# Patient Record
Sex: Male | Born: 1945
Health system: Southern US, Community
[De-identification: ages and names within clinical notes are randomized; demographics above are authoritative.]

## PROBLEM LIST (undated history)

## (undated) DIAGNOSIS — E785 Hyperlipidemia, unspecified: Secondary | ICD-10-CM

## (undated) DIAGNOSIS — Z85850 Personal history of malignant neoplasm of thyroid: Secondary | ICD-10-CM

## (undated) DIAGNOSIS — R600 Localized edema: Secondary | ICD-10-CM

## (undated) DIAGNOSIS — C73 Malignant neoplasm of thyroid gland: Secondary | ICD-10-CM

## (undated) DIAGNOSIS — M1990 Unspecified osteoarthritis, unspecified site: Secondary | ICD-10-CM

## (undated) DIAGNOSIS — K222 Esophageal obstruction: Secondary | ICD-10-CM

## (undated) DIAGNOSIS — C801 Malignant (primary) neoplasm, unspecified: Secondary | ICD-10-CM

## (undated) DIAGNOSIS — K219 Gastro-esophageal reflux disease without esophagitis: Secondary | ICD-10-CM

## (undated) DIAGNOSIS — I341 Nonrheumatic mitral (valve) prolapse: Secondary | ICD-10-CM

## (undated) DIAGNOSIS — L409 Psoriasis, unspecified: Secondary | ICD-10-CM

## (undated) DIAGNOSIS — K449 Diaphragmatic hernia without obstruction or gangrene: Secondary | ICD-10-CM

## (undated) DIAGNOSIS — R351 Nocturia: Secondary | ICD-10-CM

## (undated) DIAGNOSIS — L4050 Arthropathic psoriasis, unspecified: Secondary | ICD-10-CM

## (undated) DIAGNOSIS — D2921 Benign neoplasm of right testis: Secondary | ICD-10-CM

## (undated) DIAGNOSIS — M419 Scoliosis, unspecified: Secondary | ICD-10-CM

## (undated) DIAGNOSIS — I7300 Raynaud's syndrome without gangrene: Secondary | ICD-10-CM

## (undated) DIAGNOSIS — K922 Gastrointestinal hemorrhage, unspecified: Secondary | ICD-10-CM

## (undated) DIAGNOSIS — I10 Essential (primary) hypertension: Secondary | ICD-10-CM

## (undated) DIAGNOSIS — E782 Mixed hyperlipidemia: Secondary | ICD-10-CM

## (undated) DIAGNOSIS — I872 Venous insufficiency (chronic) (peripheral): Secondary | ICD-10-CM

## (undated) DIAGNOSIS — E039 Hypothyroidism, unspecified: Secondary | ICD-10-CM

## (undated) HISTORY — PX: HERNIA REPAIR: SHX51

## (undated) HISTORY — DX: Malignant neoplasm of thyroid gland: C73

## (undated) HISTORY — PX: THYROIDECTOMY, PARTIAL: SHX18

## (undated) HISTORY — PX: COLONOSCOPY: SHX174

## (undated) HISTORY — PX: KNEE ARTHROSCOPY: SHX127

## (undated) HISTORY — PX: VARICOSE VEIN SURGERY: SHX832

## (undated) HISTORY — DX: Arthropathic psoriasis, unspecified: L40.50

## (undated) HISTORY — DX: Esophageal obstruction: K22.2

## (undated) HISTORY — DX: Gastrointestinal hemorrhage, unspecified: K92.2

## (undated) HISTORY — PX: TONSILLECTOMY: SUR1361

## (undated) HISTORY — DX: Raynaud's syndrome without gangrene: I73.00

## (undated) HISTORY — DX: Personal history of malignant neoplasm of thyroid: Z85.850

## (undated) HISTORY — PX: UMBILICAL HERNIA REPAIR: SHX196

---

## 1971-12-11 DIAGNOSIS — Z85850 Personal history of malignant neoplasm of thyroid: Secondary | ICD-10-CM

## 1971-12-11 DIAGNOSIS — E890 Postprocedural hypothyroidism: Secondary | ICD-10-CM

## 1971-12-11 HISTORY — PX: TOTAL THYROIDECTOMY: SHX2547

## 1971-12-11 HISTORY — DX: Personal history of malignant neoplasm of thyroid: Z85.850

## 1971-12-11 HISTORY — DX: Postprocedural hypothyroidism: E89.0

## 1981-12-10 HISTORY — PX: KNEE ARTHROSCOPY: SHX127

## 2003-12-11 HISTORY — PX: UMBILICAL HERNIA REPAIR: SHX196

## 2013-12-10 HISTORY — PX: CATARACT EXTRACTION W/ INTRAOCULAR LENS IMPLANT: SHX1309

## 2014-12-30 DIAGNOSIS — Z1211 Encounter for screening for malignant neoplasm of colon: Secondary | ICD-10-CM | POA: Diagnosis not present

## 2015-02-15 DIAGNOSIS — M79641 Pain in right hand: Secondary | ICD-10-CM | POA: Diagnosis not present

## 2015-02-15 DIAGNOSIS — M545 Low back pain: Secondary | ICD-10-CM | POA: Diagnosis not present

## 2015-02-15 DIAGNOSIS — M549 Dorsalgia, unspecified: Secondary | ICD-10-CM | POA: Diagnosis not present

## 2015-02-15 DIAGNOSIS — R5383 Other fatigue: Secondary | ICD-10-CM | POA: Diagnosis not present

## 2015-02-15 DIAGNOSIS — I7300 Raynaud's syndrome without gangrene: Secondary | ICD-10-CM | POA: Diagnosis not present

## 2015-02-15 DIAGNOSIS — M7989 Other specified soft tissue disorders: Secondary | ICD-10-CM | POA: Diagnosis not present

## 2015-02-15 DIAGNOSIS — M5136 Other intervertebral disc degeneration, lumbar region: Secondary | ICD-10-CM | POA: Diagnosis not present

## 2015-02-15 DIAGNOSIS — L601 Onycholysis: Secondary | ICD-10-CM | POA: Diagnosis not present

## 2015-03-09 DIAGNOSIS — M545 Low back pain: Secondary | ICD-10-CM | POA: Diagnosis not present

## 2015-03-09 DIAGNOSIS — I7300 Raynaud's syndrome without gangrene: Secondary | ICD-10-CM | POA: Diagnosis not present

## 2015-03-09 DIAGNOSIS — L601 Onycholysis: Secondary | ICD-10-CM | POA: Diagnosis not present

## 2015-03-09 DIAGNOSIS — M154 Erosive (osteo)arthritis: Secondary | ICD-10-CM | POA: Diagnosis not present

## 2015-04-04 DIAGNOSIS — Z79899 Other long term (current) drug therapy: Secondary | ICD-10-CM | POA: Diagnosis not present

## 2015-04-04 DIAGNOSIS — H9190 Unspecified hearing loss, unspecified ear: Secondary | ICD-10-CM | POA: Diagnosis not present

## 2015-04-04 DIAGNOSIS — M545 Low back pain: Secondary | ICD-10-CM | POA: Diagnosis not present

## 2015-04-04 DIAGNOSIS — M25569 Pain in unspecified knee: Secondary | ICD-10-CM | POA: Diagnosis not present

## 2015-04-04 DIAGNOSIS — R010 Benign and innocent cardiac murmurs: Secondary | ICD-10-CM | POA: Diagnosis not present

## 2015-04-04 DIAGNOSIS — E785 Hyperlipidemia, unspecified: Secondary | ICD-10-CM | POA: Diagnosis not present

## 2015-04-04 DIAGNOSIS — J309 Allergic rhinitis, unspecified: Secondary | ICD-10-CM | POA: Diagnosis not present

## 2015-04-04 DIAGNOSIS — I10 Essential (primary) hypertension: Secondary | ICD-10-CM | POA: Diagnosis not present

## 2015-04-04 DIAGNOSIS — E039 Hypothyroidism, unspecified: Secondary | ICD-10-CM | POA: Diagnosis not present

## 2015-04-04 DIAGNOSIS — I8310 Varicose veins of unspecified lower extremity with inflammation: Secondary | ICD-10-CM | POA: Diagnosis not present

## 2015-04-04 DIAGNOSIS — K219 Gastro-esophageal reflux disease without esophagitis: Secondary | ICD-10-CM | POA: Diagnosis not present

## 2015-04-04 DIAGNOSIS — I7300 Raynaud's syndrome without gangrene: Secondary | ICD-10-CM | POA: Diagnosis not present

## 2015-08-01 DIAGNOSIS — D225 Melanocytic nevi of trunk: Secondary | ICD-10-CM | POA: Diagnosis not present

## 2015-08-01 DIAGNOSIS — L578 Other skin changes due to chronic exposure to nonionizing radiation: Secondary | ICD-10-CM | POA: Diagnosis not present

## 2015-08-01 DIAGNOSIS — L570 Actinic keratosis: Secondary | ICD-10-CM | POA: Diagnosis not present

## 2015-08-01 DIAGNOSIS — L821 Other seborrheic keratosis: Secondary | ICD-10-CM | POA: Diagnosis not present

## 2015-08-01 DIAGNOSIS — C44529 Squamous cell carcinoma of skin of other part of trunk: Secondary | ICD-10-CM | POA: Diagnosis not present

## 2015-08-25 DIAGNOSIS — H4303 Vitreous prolapse, bilateral: Secondary | ICD-10-CM | POA: Diagnosis not present

## 2015-09-23 DIAGNOSIS — Z23 Encounter for immunization: Secondary | ICD-10-CM | POA: Diagnosis not present

## 2015-10-05 DIAGNOSIS — H16221 Keratoconjunctivitis sicca, not specified as Sjogren's, right eye: Secondary | ICD-10-CM | POA: Diagnosis not present

## 2015-10-05 DIAGNOSIS — Z961 Presence of intraocular lens: Secondary | ICD-10-CM | POA: Diagnosis not present

## 2015-10-05 DIAGNOSIS — H16222 Keratoconjunctivitis sicca, not specified as Sjogren's, left eye: Secondary | ICD-10-CM | POA: Diagnosis not present

## 2015-10-05 DIAGNOSIS — I10 Essential (primary) hypertension: Secondary | ICD-10-CM | POA: Diagnosis not present

## 2015-10-10 DIAGNOSIS — E039 Hypothyroidism, unspecified: Secondary | ICD-10-CM | POA: Diagnosis not present

## 2015-10-10 DIAGNOSIS — I7300 Raynaud's syndrome without gangrene: Secondary | ICD-10-CM | POA: Diagnosis not present

## 2015-10-10 DIAGNOSIS — Z79899 Other long term (current) drug therapy: Secondary | ICD-10-CM | POA: Diagnosis not present

## 2015-10-10 DIAGNOSIS — D649 Anemia, unspecified: Secondary | ICD-10-CM | POA: Diagnosis not present

## 2015-10-10 DIAGNOSIS — Z0000 Encounter for general adult medical examination without abnormal findings: Secondary | ICD-10-CM | POA: Diagnosis not present

## 2015-10-10 DIAGNOSIS — K4090 Unilateral inguinal hernia, without obstruction or gangrene, not specified as recurrent: Secondary | ICD-10-CM | POA: Diagnosis not present

## 2015-10-10 DIAGNOSIS — R7303 Prediabetes: Secondary | ICD-10-CM | POA: Diagnosis not present

## 2015-10-10 DIAGNOSIS — Z1211 Encounter for screening for malignant neoplasm of colon: Secondary | ICD-10-CM | POA: Diagnosis not present

## 2015-10-10 DIAGNOSIS — E785 Hyperlipidemia, unspecified: Secondary | ICD-10-CM | POA: Diagnosis not present

## 2015-10-10 DIAGNOSIS — K219 Gastro-esophageal reflux disease without esophagitis: Secondary | ICD-10-CM | POA: Diagnosis not present

## 2015-10-10 DIAGNOSIS — Z125 Encounter for screening for malignant neoplasm of prostate: Secondary | ICD-10-CM | POA: Diagnosis not present

## 2015-10-10 DIAGNOSIS — I10 Essential (primary) hypertension: Secondary | ICD-10-CM | POA: Diagnosis not present

## 2015-10-10 DIAGNOSIS — H9192 Unspecified hearing loss, left ear: Secondary | ICD-10-CM | POA: Diagnosis not present

## 2015-10-10 DIAGNOSIS — Z136 Encounter for screening for cardiovascular disorders: Secondary | ICD-10-CM | POA: Diagnosis not present

## 2015-11-30 DIAGNOSIS — H16221 Keratoconjunctivitis sicca, not specified as Sjogren's, right eye: Secondary | ICD-10-CM | POA: Diagnosis not present

## 2015-11-30 DIAGNOSIS — I10 Essential (primary) hypertension: Secondary | ICD-10-CM | POA: Diagnosis not present

## 2015-11-30 DIAGNOSIS — Z961 Presence of intraocular lens: Secondary | ICD-10-CM | POA: Diagnosis not present

## 2015-11-30 DIAGNOSIS — H16222 Keratoconjunctivitis sicca, not specified as Sjogren's, left eye: Secondary | ICD-10-CM | POA: Diagnosis not present

## 2015-12-27 DIAGNOSIS — K4091 Unilateral inguinal hernia, without obstruction or gangrene, recurrent: Secondary | ICD-10-CM | POA: Diagnosis not present

## 2016-02-27 DIAGNOSIS — H16222 Keratoconjunctivitis sicca, not specified as Sjogren's, left eye: Secondary | ICD-10-CM | POA: Diagnosis not present

## 2016-02-27 DIAGNOSIS — I10 Essential (primary) hypertension: Secondary | ICD-10-CM | POA: Diagnosis not present

## 2016-02-27 DIAGNOSIS — H16221 Keratoconjunctivitis sicca, not specified as Sjogren's, right eye: Secondary | ICD-10-CM | POA: Diagnosis not present

## 2016-02-27 DIAGNOSIS — Z961 Presence of intraocular lens: Secondary | ICD-10-CM | POA: Diagnosis not present

## 2016-04-03 DIAGNOSIS — E039 Hypothyroidism, unspecified: Secondary | ICD-10-CM | POA: Diagnosis not present

## 2016-04-03 DIAGNOSIS — E782 Mixed hyperlipidemia: Secondary | ICD-10-CM | POA: Diagnosis not present

## 2016-04-03 DIAGNOSIS — L409 Psoriasis, unspecified: Secondary | ICD-10-CM | POA: Diagnosis not present

## 2016-04-03 DIAGNOSIS — M545 Low back pain: Secondary | ICD-10-CM | POA: Diagnosis not present

## 2016-04-03 DIAGNOSIS — K210 Gastro-esophageal reflux disease with esophagitis: Secondary | ICD-10-CM | POA: Diagnosis not present

## 2016-04-03 DIAGNOSIS — I10 Essential (primary) hypertension: Secondary | ICD-10-CM | POA: Diagnosis not present

## 2016-04-03 DIAGNOSIS — Z79899 Other long term (current) drug therapy: Secondary | ICD-10-CM | POA: Diagnosis not present

## 2016-04-03 DIAGNOSIS — Z87898 Personal history of other specified conditions: Secondary | ICD-10-CM | POA: Diagnosis not present

## 2016-04-03 DIAGNOSIS — G8929 Other chronic pain: Secondary | ICD-10-CM | POA: Diagnosis not present

## 2016-08-06 DIAGNOSIS — L578 Other skin changes due to chronic exposure to nonionizing radiation: Secondary | ICD-10-CM | POA: Diagnosis not present

## 2016-08-06 DIAGNOSIS — L400 Psoriasis vulgaris: Secondary | ICD-10-CM | POA: Diagnosis not present

## 2016-08-06 DIAGNOSIS — R233 Spontaneous ecchymoses: Secondary | ICD-10-CM | POA: Diagnosis not present

## 2016-09-05 DIAGNOSIS — L4050 Arthropathic psoriasis, unspecified: Secondary | ICD-10-CM | POA: Diagnosis not present

## 2016-09-05 DIAGNOSIS — R233 Spontaneous ecchymoses: Secondary | ICD-10-CM | POA: Diagnosis not present

## 2016-09-05 DIAGNOSIS — L400 Psoriasis vulgaris: Secondary | ICD-10-CM | POA: Diagnosis not present

## 2016-09-10 DIAGNOSIS — L400 Psoriasis vulgaris: Secondary | ICD-10-CM | POA: Diagnosis not present

## 2016-09-10 DIAGNOSIS — R531 Weakness: Secondary | ICD-10-CM | POA: Diagnosis not present

## 2016-09-10 DIAGNOSIS — L4050 Arthropathic psoriasis, unspecified: Secondary | ICD-10-CM | POA: Diagnosis not present

## 2016-10-10 DIAGNOSIS — M545 Low back pain: Secondary | ICD-10-CM | POA: Diagnosis not present

## 2016-10-10 DIAGNOSIS — E039 Hypothyroidism, unspecified: Secondary | ICD-10-CM | POA: Diagnosis not present

## 2016-10-10 DIAGNOSIS — L4050 Arthropathic psoriasis, unspecified: Secondary | ICD-10-CM | POA: Diagnosis not present

## 2016-10-10 DIAGNOSIS — Z125 Encounter for screening for malignant neoplasm of prostate: Secondary | ICD-10-CM | POA: Diagnosis not present

## 2016-10-10 DIAGNOSIS — I10 Essential (primary) hypertension: Secondary | ICD-10-CM | POA: Diagnosis not present

## 2016-10-10 DIAGNOSIS — Z87898 Personal history of other specified conditions: Secondary | ICD-10-CM | POA: Diagnosis not present

## 2016-10-10 DIAGNOSIS — Z23 Encounter for immunization: Secondary | ICD-10-CM | POA: Diagnosis not present

## 2016-10-10 DIAGNOSIS — K210 Gastro-esophageal reflux disease with esophagitis: Secondary | ICD-10-CM | POA: Diagnosis not present

## 2016-10-10 DIAGNOSIS — G8929 Other chronic pain: Secondary | ICD-10-CM | POA: Diagnosis not present

## 2016-10-10 DIAGNOSIS — E785 Hyperlipidemia, unspecified: Secondary | ICD-10-CM | POA: Diagnosis not present

## 2016-10-18 DIAGNOSIS — R531 Weakness: Secondary | ICD-10-CM | POA: Diagnosis not present

## 2016-11-15 DIAGNOSIS — Z136 Encounter for screening for cardiovascular disorders: Secondary | ICD-10-CM | POA: Diagnosis not present

## 2016-11-15 DIAGNOSIS — I10 Essential (primary) hypertension: Secondary | ICD-10-CM | POA: Diagnosis not present

## 2016-12-18 DIAGNOSIS — R05 Cough: Secondary | ICD-10-CM | POA: Diagnosis not present

## 2016-12-18 DIAGNOSIS — J0140 Acute pansinusitis, unspecified: Secondary | ICD-10-CM | POA: Diagnosis not present

## 2016-12-18 DIAGNOSIS — M05749 Rheumatoid arthritis with rheumatoid factor of unspecified hand without organ or systems involvement: Secondary | ICD-10-CM | POA: Diagnosis not present

## 2016-12-18 DIAGNOSIS — J029 Acute pharyngitis, unspecified: Secondary | ICD-10-CM | POA: Diagnosis not present

## 2016-12-18 DIAGNOSIS — L4050 Arthropathic psoriasis, unspecified: Secondary | ICD-10-CM | POA: Diagnosis not present

## 2017-01-08 DIAGNOSIS — L400 Psoriasis vulgaris: Secondary | ICD-10-CM | POA: Diagnosis not present

## 2017-04-04 DIAGNOSIS — Z1211 Encounter for screening for malignant neoplasm of colon: Secondary | ICD-10-CM | POA: Diagnosis not present

## 2017-04-08 DIAGNOSIS — L4050 Arthropathic psoriasis, unspecified: Secondary | ICD-10-CM | POA: Diagnosis not present

## 2017-04-08 DIAGNOSIS — C4441 Basal cell carcinoma of skin of scalp and neck: Secondary | ICD-10-CM | POA: Diagnosis not present

## 2017-04-08 DIAGNOSIS — L821 Other seborrheic keratosis: Secondary | ICD-10-CM | POA: Diagnosis not present

## 2017-04-08 DIAGNOSIS — L400 Psoriasis vulgaris: Secondary | ICD-10-CM | POA: Diagnosis not present

## 2017-04-30 DIAGNOSIS — Z79899 Other long term (current) drug therapy: Secondary | ICD-10-CM | POA: Diagnosis not present

## 2017-04-30 DIAGNOSIS — Z87898 Personal history of other specified conditions: Secondary | ICD-10-CM | POA: Diagnosis not present

## 2017-04-30 DIAGNOSIS — R7303 Prediabetes: Secondary | ICD-10-CM | POA: Diagnosis not present

## 2017-04-30 DIAGNOSIS — K219 Gastro-esophageal reflux disease without esophagitis: Secondary | ICD-10-CM | POA: Diagnosis not present

## 2017-04-30 DIAGNOSIS — E039 Hypothyroidism, unspecified: Secondary | ICD-10-CM | POA: Diagnosis not present

## 2017-04-30 DIAGNOSIS — I10 Essential (primary) hypertension: Secondary | ICD-10-CM | POA: Diagnosis not present

## 2017-04-30 DIAGNOSIS — Z1389 Encounter for screening for other disorder: Secondary | ICD-10-CM | POA: Diagnosis not present

## 2017-04-30 DIAGNOSIS — M05749 Rheumatoid arthritis with rheumatoid factor of unspecified hand without organ or systems involvement: Secondary | ICD-10-CM | POA: Diagnosis not present

## 2017-04-30 DIAGNOSIS — E782 Mixed hyperlipidemia: Secondary | ICD-10-CM | POA: Diagnosis not present

## 2017-04-30 DIAGNOSIS — E559 Vitamin D deficiency, unspecified: Secondary | ICD-10-CM | POA: Diagnosis not present

## 2017-04-30 DIAGNOSIS — L4050 Arthropathic psoriasis, unspecified: Secondary | ICD-10-CM | POA: Diagnosis not present

## 2017-08-07 DIAGNOSIS — H524 Presbyopia: Secondary | ICD-10-CM | POA: Diagnosis not present

## 2017-08-07 DIAGNOSIS — H04123 Dry eye syndrome of bilateral lacrimal glands: Secondary | ICD-10-CM | POA: Diagnosis not present

## 2017-08-15 DIAGNOSIS — H52223 Regular astigmatism, bilateral: Secondary | ICD-10-CM | POA: Diagnosis not present

## 2017-10-14 DIAGNOSIS — Z23 Encounter for immunization: Secondary | ICD-10-CM | POA: Diagnosis not present

## 2017-10-23 DIAGNOSIS — L400 Psoriasis vulgaris: Secondary | ICD-10-CM | POA: Diagnosis not present

## 2017-10-23 DIAGNOSIS — R531 Weakness: Secondary | ICD-10-CM | POA: Diagnosis not present

## 2017-11-13 DIAGNOSIS — E039 Hypothyroidism, unspecified: Secondary | ICD-10-CM | POA: Diagnosis not present

## 2017-11-13 DIAGNOSIS — M05749 Rheumatoid arthritis with rheumatoid factor of unspecified hand without organ or systems involvement: Secondary | ICD-10-CM | POA: Diagnosis not present

## 2017-11-13 DIAGNOSIS — K219 Gastro-esophageal reflux disease without esophagitis: Secondary | ICD-10-CM | POA: Diagnosis not present

## 2017-11-13 DIAGNOSIS — Z79899 Other long term (current) drug therapy: Secondary | ICD-10-CM | POA: Diagnosis not present

## 2017-11-13 DIAGNOSIS — G8929 Other chronic pain: Secondary | ICD-10-CM | POA: Diagnosis not present

## 2017-11-13 DIAGNOSIS — L4050 Arthropathic psoriasis, unspecified: Secondary | ICD-10-CM | POA: Diagnosis not present

## 2017-11-13 DIAGNOSIS — I10 Essential (primary) hypertension: Secondary | ICD-10-CM | POA: Diagnosis not present

## 2017-11-13 DIAGNOSIS — M545 Low back pain: Secondary | ICD-10-CM | POA: Diagnosis not present

## 2017-11-13 DIAGNOSIS — E782 Mixed hyperlipidemia: Secondary | ICD-10-CM | POA: Diagnosis not present

## 2017-11-21 DIAGNOSIS — Z1389 Encounter for screening for other disorder: Secondary | ICD-10-CM | POA: Diagnosis not present

## 2017-11-21 DIAGNOSIS — Z0000 Encounter for general adult medical examination without abnormal findings: Secondary | ICD-10-CM | POA: Diagnosis not present

## 2017-11-21 DIAGNOSIS — E785 Hyperlipidemia, unspecified: Secondary | ICD-10-CM | POA: Diagnosis not present

## 2017-11-21 DIAGNOSIS — Z136 Encounter for screening for cardiovascular disorders: Secondary | ICD-10-CM | POA: Diagnosis not present

## 2017-11-21 DIAGNOSIS — I10 Essential (primary) hypertension: Secondary | ICD-10-CM | POA: Diagnosis not present

## 2017-11-21 DIAGNOSIS — Z125 Encounter for screening for malignant neoplasm of prostate: Secondary | ICD-10-CM | POA: Diagnosis not present

## 2018-04-08 DIAGNOSIS — R531 Weakness: Secondary | ICD-10-CM | POA: Diagnosis not present

## 2018-04-08 DIAGNOSIS — L400 Psoriasis vulgaris: Secondary | ICD-10-CM | POA: Diagnosis not present

## 2018-05-20 DIAGNOSIS — K219 Gastro-esophageal reflux disease without esophagitis: Secondary | ICD-10-CM | POA: Diagnosis not present

## 2018-05-20 DIAGNOSIS — E039 Hypothyroidism, unspecified: Secondary | ICD-10-CM | POA: Diagnosis not present

## 2018-05-20 DIAGNOSIS — I10 Essential (primary) hypertension: Secondary | ICD-10-CM | POA: Diagnosis not present

## 2018-05-20 DIAGNOSIS — E782 Mixed hyperlipidemia: Secondary | ICD-10-CM | POA: Diagnosis not present

## 2018-06-02 DIAGNOSIS — E871 Hypo-osmolality and hyponatremia: Secondary | ICD-10-CM | POA: Diagnosis not present

## 2018-06-17 DIAGNOSIS — I10 Essential (primary) hypertension: Secondary | ICD-10-CM | POA: Diagnosis not present

## 2018-06-20 DIAGNOSIS — M25579 Pain in unspecified ankle and joints of unspecified foot: Secondary | ICD-10-CM | POA: Diagnosis not present

## 2018-06-20 DIAGNOSIS — R069 Unspecified abnormalities of breathing: Secondary | ICD-10-CM | POA: Diagnosis not present

## 2018-07-08 DIAGNOSIS — L400 Psoriasis vulgaris: Secondary | ICD-10-CM | POA: Diagnosis not present

## 2018-07-08 DIAGNOSIS — L4050 Arthropathic psoriasis, unspecified: Secondary | ICD-10-CM | POA: Diagnosis not present

## 2018-08-21 DIAGNOSIS — D101 Benign neoplasm of tongue: Secondary | ICD-10-CM | POA: Diagnosis not present

## 2018-09-25 ENCOUNTER — Encounter: Payer: Self-pay | Admitting: Cardiology

## 2018-09-25 DIAGNOSIS — I10 Essential (primary) hypertension: Secondary | ICD-10-CM | POA: Diagnosis not present

## 2018-09-25 DIAGNOSIS — K219 Gastro-esophageal reflux disease without esophagitis: Secondary | ICD-10-CM | POA: Diagnosis not present

## 2018-09-25 DIAGNOSIS — E039 Hypothyroidism, unspecified: Secondary | ICD-10-CM | POA: Diagnosis not present

## 2018-09-25 DIAGNOSIS — Z23 Encounter for immunization: Secondary | ICD-10-CM | POA: Diagnosis not present

## 2018-09-25 DIAGNOSIS — E782 Mixed hyperlipidemia: Secondary | ICD-10-CM | POA: Diagnosis not present

## 2018-10-01 DIAGNOSIS — H4303 Vitreous prolapse, bilateral: Secondary | ICD-10-CM | POA: Diagnosis not present

## 2018-10-01 DIAGNOSIS — H04123 Dry eye syndrome of bilateral lacrimal glands: Secondary | ICD-10-CM | POA: Diagnosis not present

## 2018-11-04 DIAGNOSIS — K4091 Unilateral inguinal hernia, without obstruction or gangrene, recurrent: Secondary | ICD-10-CM | POA: Diagnosis not present

## 2018-11-18 ENCOUNTER — Other Ambulatory Visit: Payer: Self-pay

## 2018-11-18 ENCOUNTER — Encounter (HOSPITAL_BASED_OUTPATIENT_CLINIC_OR_DEPARTMENT_OTHER): Payer: Self-pay | Admitting: *Deleted

## 2018-11-19 ENCOUNTER — Other Ambulatory Visit: Payer: Self-pay | Admitting: General Surgery

## 2018-11-20 ENCOUNTER — Other Ambulatory Visit: Payer: Self-pay

## 2018-11-20 ENCOUNTER — Encounter (HOSPITAL_BASED_OUTPATIENT_CLINIC_OR_DEPARTMENT_OTHER)
Admission: RE | Admit: 2018-11-20 | Discharge: 2018-11-20 | Disposition: A | Discharge status: Home / Self Care | Payer: Medicare Other | Source: Ambulatory Visit | Attending: General Surgery | Admitting: General Surgery

## 2018-11-20 DIAGNOSIS — Z01810 Encounter for preprocedural cardiovascular examination: Secondary | ICD-10-CM | POA: Diagnosis not present

## 2018-11-20 MED ORDER — ENSURE PRE-SURGERY PO LIQD: 296.0000 mL | Freq: Once | ORAL | Status: DC

## 2018-11-20 NOTE — Progress Notes (Signed)
Ensure pre surgery drink given with instructions to complete by 0530 dos, pt verbalized understanding. 

## 2018-11-26 NOTE — Progress Notes (Signed)
EKG and chart reviewed with Dr. Lissa Hoard during pre op planning. Dr Darnell Level requesting cardiac clearance for patient before having surgery. Abigail Butts at Dr Cristal Generous office made aware and stated she would tell Dr. Donne Hazel.

## 2018-11-27 ENCOUNTER — Ambulatory Visit (HOSPITAL_BASED_OUTPATIENT_CLINIC_OR_DEPARTMENT_OTHER): Admission: RE | Admit: 2018-11-27 | Payer: Medicare Other | Source: Home / Self Care | Admitting: General Surgery

## 2018-11-27 HISTORY — DX: Essential (primary) hypertension: I10

## 2018-11-27 HISTORY — DX: Unspecified osteoarthritis, unspecified site: M19.90

## 2018-11-27 HISTORY — DX: Gastro-esophageal reflux disease without esophagitis: K21.9

## 2018-11-27 HISTORY — DX: Hyperlipidemia, unspecified: E78.5

## 2018-11-27 HISTORY — DX: Hypothyroidism, unspecified: E03.9

## 2018-11-27 HISTORY — DX: Malignant (primary) neoplasm, unspecified: C80.1

## 2018-11-27 SURGERY — REPAIR, HERNIA, INGUINAL, ADULT
Anesthesia: General | Laterality: Left

## 2018-12-09 ENCOUNTER — Encounter: Payer: Self-pay | Admitting: Cardiology

## 2018-12-09 ENCOUNTER — Ambulatory Visit (INDEPENDENT_AMBULATORY_CARE_PROVIDER_SITE_OTHER): Payer: Medicare Other | Admitting: Cardiology

## 2018-12-09 VITALS — BP 158/76 | HR 52 | Ht 69.0 in | Wt 167.0 lb

## 2018-12-09 DIAGNOSIS — E782 Mixed hyperlipidemia: Secondary | ICD-10-CM | POA: Insufficient documentation

## 2018-12-09 DIAGNOSIS — R9431 Abnormal electrocardiogram [ECG] [EKG]: Secondary | ICD-10-CM | POA: Diagnosis not present

## 2018-12-09 DIAGNOSIS — I10 Essential (primary) hypertension: Secondary | ICD-10-CM | POA: Insufficient documentation

## 2018-12-09 DIAGNOSIS — Z01810 Encounter for preprocedural cardiovascular examination: Secondary | ICD-10-CM

## 2018-12-09 HISTORY — DX: Abnormal electrocardiogram (ECG) (EKG): R94.31

## 2018-12-09 HISTORY — DX: Mixed hyperlipidemia: E78.2

## 2018-12-09 HISTORY — DX: Essential (primary) hypertension: I10

## 2018-12-09 NOTE — Patient Instructions (Signed)
Medication Instructions:   Your physician recommends that you continue on your current medications as directed. Please refer to the Current Medication list given to you today.  If you need a refill on your cardiac medications before your next appointment, please call your pharmacy.   Lab work: NONE   Testing/Procedures:  Your physician has requested that you have a stress echocardiogram. For further information please visit [email or web address]. Please follow instruction sheet as given.  Your physician has requested that you have an echocardiogram. Echocardiography is a painless test that uses sound waves to create images of your heart. It provides your doctor with information about the size and shape of your heart and how well your heart's chambers and valves are working. This procedure takes approximately one hour. There are no restrictions for this procedure.   Follow-Up:  At [facility], you and your health needs are our priority.  As part of our continuing mission to provide you with exceptional heart care, we have created designated Provider Care Teams.  These Care Teams include your primary Cardiologist (physician) and Advanced Practice Providers (APPs -  Physician Assistants and Nurse Practitioners) who all work together to provide you with the care you need, when you need it.

## 2018-12-09 NOTE — Progress Notes (Signed)
Cardiology Office Note:    Date:  12/09/2018   ID:  Cristian Nunez, DOB [birth year], MRN [id number]  PCP:  Renaldo Reel, PA  Cardiologist:  Jenean Lindau, MD   Referring MD: Renaldo Reel, PA    ASSESSMENT:    1. Pre-operative cardiovascular examination   2. Essential hypertension   3. Mixed dyslipidemia   4. Abnormal EKG    PLAN:    In order of problems listed above:  1. Secondary prevention stressed with the patient.  Importance of compliance with diet and medication stressed and he vocalized understanding.  His blood pressure is stable.  He has an element of whitecoat hypertension at home and better.  He is a Software engineer by profession. 2. In view of abnormal EKG the patient will undergo exercise stress echo. 3. He has a significant murmur heard on auscultation and echocardiogram will help understanding his valvular anatomy.  He mentions to me that he was told to take antibiotic prophylaxis as a child. 4. He will be seen in follow-up appointment in the PRN basis.  If his stress test is negative and his echo is unremarkable then he is not at high risk for coronary events during the aforementioned surgery.  Meticulous hemodynamic monitoring will further reduce the risk of coronary events.   Medication Adjustments/Labs and Tests Ordered: Current medicines are reviewed at length with the patient today.  Concerns regarding medicines are outlined above.  No orders of the defined types were placed in this encounter.  No orders of the defined types were placed in this encounter.    History of Present Illness:    Cristian Nunez is a 72 y.o. male who is being seen today for the evaluation of preop cardiovascular risk stratification at the request of Renaldo Reel, [place].  Patient is a pleasant 72 year old male with past medical history of essential hypertension, dyslipidemia.  He mentions to me he leads a sedentary lifestyle and was contemplating abdominal hernia surgery.  He was found  to have an abnormal EKG and sent here for evaluation.  At the time of my evaluation, the patient is alert awake oriented and in no distress.  He denies any chest pain orthopnea or PND.  He does not do much in terms of activity.  Past Medical History:  Diagnosis Date  . Arthritis    psoriatic  . 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 GERD (gastroesophageal reflux disease)   . Hyperlipidemia   . Hypertension   . 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 HERNIA REPAIR Bilateral   . KNEE ARTHROSCOPY    . THYROIDECTOMY, PARTIAL    . VARICOSE VEIN SURGERY      Current Medications: Current Meds  Medication Sig  . amLODipine (NORVASC) 2.5 MG tablet Take 2.5 mg by mouth daily.  Marland Kitchen aspirin-acetaminophen-caffeine (EXCEDRIN MIGRAINE) 250-250-65 MG tablet Take 1 tablet by mouth every 6 (six) hours as needed for headache.  . b complex vitamins tablet Take 1 tablet by mouth daily.  . Biotin 10 MG CAPS Take 1 capsule by mouth daily.   . Ixekizumab (TALTZ) 80 MG/ML SOSY Inject into the skin.  Marland Kitchen levothyroxine (SYNTHROID, LEVOTHROID) 100 MCG tablet Take 100 mcg by mouth daily before breakfast.  . olmesartan (BENICAR) 40 MG tablet Take 40 mg by mouth daily.  Marland Kitchen omeprazole (PRILOSEC) 40 MG capsule Take 40 mg by mouth daily.  . rosuvastatin (CRESTOR) 10  MG tablet Take 10 mg by mouth daily.     Allergies:   Patient has no known allergies.   Social History   Socioeconomic History  . Marital status: Married    Spouse name: Not on file  . Number of children: Not on file  . Years of education: Not on file  . Highest education level: Not on file  Occupational History  . Not on file  Social Needs  . Financial resource strain: Not on file  . Food insecurity:    Worry: Not on file    Inability: Not on file  . Transportation needs:    Medical: Not on file    Non-medical: Not on file  Tobacco Use  . Smoking status: Never Smoker  . Smokeless tobacco:  Never Used  Substance and Sexual Activity  . Alcohol use: Yes    Alcohol/week: 7.0 standard drinks    Types: 7 Cans of beer per week    Frequency: Never  . Drug use: Never  . Sexual activity: Not on file  Lifestyle  . Physical activity:    Days per week: Not on file    Minutes per session: Not on file  . Stress: Not on file  Relationships  . Social connections:    Talks on phone: Not on file    Gets together: Not on file    Attends religious service: Not on file    Active member of club or organization: Not on file    Attends meetings of clubs or organizations: Not on file    Relationship status: Not on file  Other Topics Concern  . Not on file  Social History Narrative  . Not on file     Family History: The patient's family history is not on file.  ROS:   Please see the history of present illness.    All other systems reviewed and are negative.  EKGs/Labs/Other Studies Reviewed:    The following studies were reviewed today: EKG was reviewed it reveals sinus rhythm with preexcitation and delta wave.   Recent Labs: No results found for requested labs within last 8760 hours.  Recent Lipid Panel No results found for: CHOL, TRIG, HDL, CHOLHDL, VLDL, LDLCALC, LDLDIRECT  Physical Exam:    VS:  BP (!) 158/76 (BP Location: Right Arm, Patient Position: Sitting, Cuff Size: Normal)   Pulse (!) 52   Ht 5\' 9"  (1.753 m)   Wt 167 lb (75.8 kg)   SpO2 96%   BMI 24.66 kg/m     Wt Readings from Last 3 Encounters:  12/09/18 167 lb (75.8 kg)     GEN: Patient is in no acute distress HEENT: Normal NECK: No JVD; No carotid bruits LYMPHATICS: No lymphadenopathy CARDIAC: S1 S2 regular, 2/6 systolic murmur at the apex. RESPIRATORY:  Clear to auscultation without rales, wheezing or rhonchi  ABDOMEN: Soft, non-tender, non-distended MUSCULOSKELETAL:  No edema; No deformity  SKIN: Warm and dry NEUROLOGIC:  Alert and oriented x 3 PSYCHIATRIC:  Normal affect    Signed, Jenean Lindau, MD  12/09/2018 2:57 PM    [facility]

## 2018-12-12 ENCOUNTER — Ambulatory Visit: Payer: Medicare Other | Admitting: Cardiology

## 2018-12-16 ENCOUNTER — Ambulatory Visit (HOSPITAL_BASED_OUTPATIENT_CLINIC_OR_DEPARTMENT_OTHER)
Admission: RE | Admit: 2018-12-16 | Discharge: 2018-12-16 | Disposition: A | Discharge status: Home / Self Care | Payer: Medicare Other | Source: Ambulatory Visit | Attending: Cardiology | Admitting: Cardiology

## 2018-12-16 ENCOUNTER — Other Ambulatory Visit (HOSPITAL_BASED_OUTPATIENT_CLINIC_OR_DEPARTMENT_OTHER): Payer: Medicare Other

## 2018-12-16 DIAGNOSIS — R9431 Abnormal electrocardiogram [ECG] [EKG]: Secondary | ICD-10-CM | POA: Diagnosis not present

## 2018-12-16 DIAGNOSIS — Z01810 Encounter for preprocedural cardiovascular examination: Secondary | ICD-10-CM | POA: Diagnosis not present

## 2018-12-16 NOTE — Progress Notes (Signed)
  Echocardiogram 2D Echocardiogram has been performed.  Cristian Nunez T [name] 12/16/2018, 2:50 PM

## 2018-12-19 ENCOUNTER — Ambulatory Visit (HOSPITAL_BASED_OUTPATIENT_CLINIC_OR_DEPARTMENT_OTHER): Payer: Medicare Other

## 2018-12-22 ENCOUNTER — Ambulatory Visit (HOSPITAL_BASED_OUTPATIENT_CLINIC_OR_DEPARTMENT_OTHER): Admission: RE | Admit: 2018-12-22 | Payer: Medicare Other | Source: Ambulatory Visit

## 2018-12-25 ENCOUNTER — Ambulatory Visit: Payer: Medicare Other | Admitting: Cardiology

## 2018-12-26 ENCOUNTER — Ambulatory Visit (HOSPITAL_BASED_OUTPATIENT_CLINIC_OR_DEPARTMENT_OTHER)
Admission: RE | Admit: 2018-12-26 | Discharge: 2018-12-26 | Disposition: A | Discharge status: Home / Self Care | Payer: Medicare Other | Source: Ambulatory Visit | Attending: Cardiology | Admitting: Cardiology

## 2018-12-26 DIAGNOSIS — Z01810 Encounter for preprocedural cardiovascular examination: Secondary | ICD-10-CM | POA: Diagnosis not present

## 2018-12-26 DIAGNOSIS — R9431 Abnormal electrocardiogram [ECG] [EKG]: Secondary | ICD-10-CM | POA: Insufficient documentation

## 2018-12-26 NOTE — Progress Notes (Signed)
  Echocardiogram 2D Echocardiogram has been performed.  Cristian Nunez Cristian Nunez 12/26/2018, 2:14 PM

## 2018-12-30 ENCOUNTER — Ambulatory Visit: Payer: Medicare Other | Admitting: Cardiology

## 2019-01-02 ENCOUNTER — Encounter: Payer: Self-pay | Admitting: Cardiology

## 2019-01-02 ENCOUNTER — Ambulatory Visit (INDEPENDENT_AMBULATORY_CARE_PROVIDER_SITE_OTHER): Payer: Medicare Other | Admitting: Cardiology

## 2019-01-02 VITALS — BP 122/72 | HR 79 | Ht 69.0 in | Wt 168.0 lb

## 2019-01-02 DIAGNOSIS — E782 Mixed hyperlipidemia: Secondary | ICD-10-CM

## 2019-01-02 DIAGNOSIS — Z01810 Encounter for preprocedural cardiovascular examination: Secondary | ICD-10-CM

## 2019-01-02 DIAGNOSIS — I10 Essential (primary) hypertension: Secondary | ICD-10-CM | POA: Diagnosis not present

## 2019-01-02 NOTE — Patient Instructions (Signed)
Medication Instructions: Your physician recommends that you continue on your current medications as directed. Please refer to the Current Medication list given to you today.  If you need a refill on your cardiac medications before your next appointment, please call your pharmacy.   Lab work: NONE If you have labs (blood work) drawn today and your tests are completely normal, you will receive your results only by: . MyChart Message (if you have MyChart) OR . A paper copy in the mail If you have any lab test that is abnormal or we need to change your treatment, we will call you to review the results.  Testing/Procedures: NONE  Follow-Up: At [facility], you and your health needs are our priority.  As part of our continuing mission to provide you with exceptional heart care, we have created designated Provider Care Teams.  These Care Teams include your primary Cardiologist (physician) and Advanced Practice Providers (APPs -  Physician Assistants and Nurse Practitioners) who all work together to provide you with the care you need, when you need it. You will need a follow up appointment in 2 months.    

## 2019-01-02 NOTE — Progress Notes (Signed)
Cardiology Office Note:    Date:  01/02/2019   ID:  Cristian, Nunez September 22, 1946, MRN [id number]  PCP:  Renaldo Reel, PA  Cardiologist:  Jenean Lindau, MD   Referring MD: Renaldo Reel, PA    ASSESSMENT:    1. Essential hypertension   2. Mixed dyslipidemia   3. Pre-operative cardiovascular examination    PLAN:    In order of problems listed above:  1. Primary prevention stressed with the patient.  Importance of compliance with diet and medication stressed and he vocalized understanding.  His blood pressure is stable. 2. Mitral valve prolapse and regurgitation issues were discussed.  The patient mentions to me that he walks about a mile a day without any problems at a normal pace and in view of this I think he is not at high risk for coronary events during the aforementioned surgery.  Meticulous hemodynamic monitoring will further reduce the risk of coronary events.  He wants to undergo this hernia surgery first and then come back to me to discuss mitral valve prolapse and regurgitation issues.  These are significant issues and will need specialist evaluation and I discussed this with the patient.   Medication Adjustments/Labs and Tests Ordered: Current medicines are reviewed at length with the patient today.  Concerns regarding medicines are outlined above.  No orders of the defined types were placed in this encounter.  No orders of the defined types were placed in this encounter.    No chief complaint on file.    History of Present Illness:    Cristian Nunez is a 73 y.o. male.  Patient was evaluated by me for preop cardiovascular risk stratification effort tolerance is within normal limits he was found to have left and mitral prolapse with moderate to severe he takes care of activities of orthopnea or PND.  Past Medical History:  Diagnosis Date  . Arthritis    psoriatic  . 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 GERD (gastroesophageal reflux disease)   . Hyperlipidemia   .  Hypertension   . 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 HERNIA REPAIR Bilateral   . KNEE ARTHROSCOPY    . THYROIDECTOMY, PARTIAL    . VARICOSE VEIN SURGERY      Current Medications: Current Meds  Medication Sig  . amLODipine (NORVASC) 2.5 MG tablet Take 2.5 mg by mouth daily.  Marland Kitchen aspirin-acetaminophen-caffeine (EXCEDRIN MIGRAINE) 250-250-65 MG tablet Take 1 tablet by mouth every 6 (six) hours as needed for headache.  . b complex vitamins tablet Take 1 tablet by mouth daily.  . Biotin 10 MG CAPS Take 1 capsule by mouth daily.   . Ixekizumab (TALTZ) 80 MG/ML SOSY Inject into the skin.  Marland Kitchen levothyroxine (SYNTHROID, LEVOTHROID) 100 MCG tablet Take 100 mcg by mouth daily before breakfast.  . olmesartan (BENICAR) 40 MG tablet Take 40 mg by mouth daily.  Marland Kitchen omeprazole (PRILOSEC) 40 MG capsule Take 40 mg by mouth daily.  . rosuvastatin (CRESTOR) 10 MG tablet Take 10 mg by mouth daily.     Allergies:   Patient has no known allergies.   Social History   Socioeconomic History  . Marital status: Married    Spouse name: Not on file  . Number of children: Not on file  . Years of education: Not on file  . Highest education level: Not on file  Occupational History  . Not on file  Social Needs  . Financial resource strain: Not on file  . Food insecurity:    Worry: Not on file    Inability: Not on file  . Transportation needs:    Medical: Not on file    Non-medical: Not on file  Tobacco Use  . Smoking status: Never Smoker  . Smokeless tobacco: Never Used  Substance and Sexual Activity  . Alcohol use: Yes    Alcohol/week: 7.0 standard drinks    Types: 7 Cans of beer per week    Frequency: Never  . Drug use: Never  . Sexual activity: Not on file  Lifestyle  . Physical activity:    Days per week: Not on file    Minutes per session: Not on file  . Stress: Not on file  Relationships  . Social connections:    Talks on  phone: Not on file    Gets together: Not on file    Attends religious service: Not on file    Active member of club or organization: Not on file    Attends meetings of clubs or organizations: Not on file    Relationship status: Not on file  Other Topics Concern  . Not on file  Social History Narrative  . Not on file     Family History: The patient's family history is not on file.  ROS:   Please see the history of present illness.    All other systems reviewed and are negative.  EKGs/Labs/Other Studies Reviewed:    The following studies were reviewed today: I discussed the findings of the echocardiogram and stress test with the patient at extensive length.Impressions:  - 1. Left ventricular systolic function is preserved visually   estimated at 60 to 65%. Impaired left ventricular relaxation.   2. Mitral valve prolapse with moderate to severe mitral   regurgitation   3. Moderate left atrial enlargement   4. Ascending aorta was measured at 3.7 cm.   Impressions:  - 1. Stress echo negative for evidence of inducible ischemia.   2. Normal exercise capacity   3. Patient had frequent PVCs at rest which resolved during   exercise and returned during recovery. During early recovery   patient also had ventricular bigeminy at times. Asymptomatic.    Recent Labs: No results found for requested labs within last 8760 hours.  Recent Lipid Panel No results found for: CHOL, TRIG, HDL, CHOLHDL, VLDL, LDLCALC, LDLDIRECT  Physical Exam:    VS:  BP 122/72 (BP Location: Right Arm, Patient Position: Sitting, Cuff Size: Normal)   Pulse 79   Ht 5\' 9"  (1.753 m)   Wt 168 lb (76.2 kg)   SpO2 99%   BMI 24.81 kg/m     Wt Readings from Last 3 Encounters:  01/02/19 168 lb (76.2 kg)  12/09/18 167 lb (75.8 kg)     GEN: Patient is in no acute distress HEENT: Normal NECK: No JVD; No carotid bruits LYMPHATICS: No lymphadenopathy CARDIAC: Hear sounds regular, 2/6 systolic murmur at the  apex. RESPIRATORY:  Clear to auscultation without rales, wheezing or rhonchi  ABDOMEN: Soft, non-tender, non-distended MUSCULOSKELETAL:  No edema; No deformity  SKIN: Warm and dry NEUROLOGIC:  Alert and oriented x 3 PSYCHIATRIC:  Normal affect   Signed, Jenean Lindau, MD  01/02/2019 11:06 AM    [place]

## 2019-01-08 DIAGNOSIS — L720 Epidermal cyst: Secondary | ICD-10-CM | POA: Diagnosis not present

## 2019-01-08 DIAGNOSIS — L219 Seborrheic dermatitis, unspecified: Secondary | ICD-10-CM | POA: Diagnosis not present

## 2019-01-08 DIAGNOSIS — L700 Acne vulgaris: Secondary | ICD-10-CM | POA: Diagnosis not present

## 2019-01-08 DIAGNOSIS — R531 Weakness: Secondary | ICD-10-CM | POA: Diagnosis not present

## 2019-01-08 DIAGNOSIS — L400 Psoriasis vulgaris: Secondary | ICD-10-CM | POA: Diagnosis not present

## 2019-01-22 ENCOUNTER — Other Ambulatory Visit: Payer: Self-pay | Admitting: General Surgery

## 2019-01-26 ENCOUNTER — Encounter (HOSPITAL_COMMUNITY): Payer: Self-pay

## 2019-01-26 NOTE — Anesthesia Preprocedure Evaluation (Addendum)
Anesthesia Evaluation  Patient identified by MRN, date of birth, ID band Patient awake    Reviewed: Allergy & Precautions, H&P , NPO status , Patient's Chart, lab work & pertinent test results  Airway Mallampati: III  TM Distance: >3 FB Neck ROM: Full    Dental no notable dental hx. (+) Teeth Intact, Dental Advisory Given, Implants   Pulmonary neg pulmonary ROS,    Pulmonary exam normal breath sounds clear to auscultation       Cardiovascular Exercise Tolerance: Good hypertension, Pt. on medications + Valvular Problems/Murmurs MR  Rhythm:Regular Rate:Normal     Neuro/Psych negative neurological ROS  negative psych ROS   GI/Hepatic Neg liver ROS, GERD  Medicated and Controlled,  Endo/Other  Hypothyroidism   Renal/GU negative Renal ROS  negative genitourinary   Musculoskeletal  (+) Arthritis , Osteoarthritis,    Abdominal   Peds  Hematology negative hematology ROS (+)   Anesthesia Other Findings   Reproductive/Obstetrics negative OB ROS                          Anesthesia Physical Anesthesia Plan  ASA: III  Anesthesia Plan: General   Post-op Pain Management:  Regional for Post-op pain   Induction: Intravenous  PONV Risk Score and Plan: 3 and Ondansetron and Dexamethasone  Airway Management Planned: Oral ETT  Additional Equipment:   Intra-op Plan:   Post-operative Plan: Extubation in OR  Informed Consent: I have reviewed the patients History and Physical, chart, labs and discussed the procedure including the risks, benefits and alternatives for the proposed anesthesia with the patient or authorized representative who has indicated his/her understanding and acceptance.     Dental advisory given  Plan Discussed with: CRNA  Anesthesia Plan Comments: (See PAT note by Karoline Caldwell, PA-C )       Anesthesia Quick Evaluation

## 2019-01-26 NOTE — Progress Notes (Signed)
Gave patient pre-op instructions and to arrive at 0630. Patient has ERAS protocol, and per protocol patient is allowed to have clear liquids up to 3 hours before surgery. Patient also has carb drink that he was informed to have drank by 0530. Patient verbalized understanding.

## 2019-01-26 NOTE — Progress Notes (Signed)
Anesthesia Chart Review: SAME DAY WORKUP   Case:  [id number] Date/Time:  01/28/19 0815   Procedure:  LEFT INGUINAL HERNIA REPAIR WITH MESH (Left ) - GENERAL ANESTHESIA AND TAP BLOCK   Anesthesia type:  General   Pre-op diagnosis:  LEFT INGUINAL HERNIA   Location:  [place] OR ROOM 08 / [place] [facility]   Surgeon:  Rolm Bookbinder, MD      DISCUSSION: 73 yo male never smoker. Pertinent hx includes Psoriatic arthritis, Hypothyroid (thyorid CA s/p remote thyroidectomy), GERD, HTN, HLD, Mitral valve prolapse with mod-severe MR.  Case previously cancelled due to abnormal EKG. Pt was then seen by Dr. Geraldo Pitter for preop risk stratification. TTE was done 12/16/18 and showed EF 60-65%, Mitral valve prolapse with moderate to severe mitral regurgitation, Moderate left atrial enlargement. Pt subsequently had stress echo 12/26/18 which was was negative for inducible ischemia, exercise capacity was normal, frequent PVCs at rest which resolved during exercise and returned during recovery.   He was seen in followup By Dr. Geraldo Pitter 01/02/19 for clearance. Per OV note "Mitral valve prolapse and regurgitation issues were discussed.  The patient mentions to me that he walks about a mile a day without any problems at a normal pace and in view of this I think he is not at high risk for coronary events during the aforementioned surgery.  Meticulous hemodynamic monitoring will further reduce the risk of coronary events.  He wants to undergo this hernia surgery first and then come back to me to discuss mitral valve prolapse and regurgitation issues."  Anticipate he can proceed as planned barring acute status change   VS: There were no vitals taken for this visit.  PROVIDERS: Renaldo Reel, PA is PCP  Jyl Heinz, MD is Cardiologist  LABS: Will need DOS labs  Labs Reviewed - No data to display   EKG: 11/20/2018: Sinus rhythm with 1st degree A-V block with Premature atrial complexes with Abberant conduction. Rate 73. Possible  Septal infarct , age undetermined.  CV: Stres echo 12/26/2018: Study Conclusions  - Stress ECG conclusions: There were no stress arrhythmias or   conduction abnormalities. The stress ECG was negative for   ischemia. - Staged echo: There was no echocardiographic evidence for   stress-induced ischemia.  Impressions:  - 1. Stress echo negative for evidence of inducible ischemia.   2. Normal exercise capacity   3. Patient had frequent PVCs at rest which resolved during   exercise and returned during recovery. During early recovery   patient also had ventricular bigeminy at times. Asymptomatic.  TTE 12/16/2018: Study Conclusions  - Left ventricle: The cavity size was normal. Wall thickness was   increased in a pattern of mild LVH. Systolic function was normal.   The estimated ejection fraction was in the range of 60% to 65%.   Wall motion was normal; there were no regional wall motion   abnormalities. Doppler parameters are consistent with abnormal   left ventricular relaxation (grade 1 diastolic dysfunction). - Mitral valve: Prolapse. Moderate prolapse. There was moderate to   severe regurgitation. - Left atrium: The atrium was moderately dilated.  Impressions:  - 1. Left ventricular systolic function is preserved visually   estimated at 60 to 65%. Impaired left ventricular relaxation.   2. Mitral valve prolapse with moderate to severe mitral   regurgitation   3. Moderate left atrial enlargement   4. Ascending aorta was measured at 3.7 cm.  Past Medical History:  Diagnosis Date  . Arthritis  psoriatic  . 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 GERD (gastroesophageal reflux disease)   . Hyperlipidemia   . Hypertension   . 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 Mitral valve prolapse     Past Surgical History:  Procedure Laterality Date  . HERNIA REPAIR Bilateral   . KNEE ARTHROSCOPY    . THYROIDECTOMY, PARTIAL    . VARICOSE VEIN SURGERY      MEDICATIONS: No  current facility-administered medications for this encounter.    Marland Kitchen amLODipine (NORVASC) 2.5 MG tablet  . aspirin-acetaminophen-caffeine (EXCEDRIN MIGRAINE) 250-250-65 MG tablet  . b complex vitamins tablet  . Biotin 5 MG CAPS  . Calcium Citrate-Vitamin D (CALCIUM + D PO)  . fexofenadine (ALLEGRA) 180 MG tablet  . levothyroxine (SYNTHROID, LEVOTHROID) 100 MCG tablet  . methocarbamol (ROBAXIN) 500 MG tablet  . naproxen sodium (ALEVE) 220 MG tablet  . olmesartan (BENICAR) 40 MG tablet  . omeprazole (PRILOSEC) 40 MG capsule  . pseudoephedrine (SUDAFED) 30 MG tablet  . rosuvastatin (CRESTOR) 10 MG tablet  . vitamin B-12 (CYANOCOBALAMIN) 1000 MCG tablet  . Ixekizumab (TALTZ) 80 MG/ML SOSY  . vitamin E 400 UNIT capsule   . feeding supplement (ENSURE PRE-SURGERY) liquid 296 mL    Wynonia Musty [facility] Stay [facility]/Anesthesiology Phone [phone] 01/26/2019 1:54 PM

## 2019-01-27 ENCOUNTER — Encounter (HOSPITAL_COMMUNITY): Payer: Self-pay | Admitting: Anesthesiology

## 2019-01-28 ENCOUNTER — Other Ambulatory Visit: Payer: Self-pay

## 2019-01-28 ENCOUNTER — Encounter (HOSPITAL_COMMUNITY): Payer: Self-pay

## 2019-01-28 ENCOUNTER — Ambulatory Visit (HOSPITAL_COMMUNITY): Payer: Medicare Other | Admitting: Physician Assistant

## 2019-01-28 ENCOUNTER — Ambulatory Visit (HOSPITAL_COMMUNITY)
Admission: RE | Admit: 2019-01-28 | Discharge: 2019-01-28 | Disposition: A | Discharge status: Home / Self Care | Payer: Medicare Other | Attending: General Surgery | Admitting: General Surgery

## 2019-01-28 ENCOUNTER — Encounter (HOSPITAL_COMMUNITY)
Admission: RE | Disposition: A | Discharge status: Home / Self Care | Payer: Self-pay | Source: Home / Self Care | Attending: General Surgery

## 2019-01-28 DIAGNOSIS — I341 Nonrheumatic mitral (valve) prolapse: Secondary | ICD-10-CM | POA: Insufficient documentation

## 2019-01-28 DIAGNOSIS — L4050 Arthropathic psoriasis, unspecified: Secondary | ICD-10-CM | POA: Diagnosis not present

## 2019-01-28 DIAGNOSIS — E785 Hyperlipidemia, unspecified: Secondary | ICD-10-CM | POA: Diagnosis not present

## 2019-01-28 DIAGNOSIS — E890 Postprocedural hypothyroidism: Secondary | ICD-10-CM | POA: Insufficient documentation

## 2019-01-28 DIAGNOSIS — K4091 Unilateral inguinal hernia, without obstruction or gangrene, recurrent: Secondary | ICD-10-CM | POA: Insufficient documentation

## 2019-01-28 DIAGNOSIS — Z79899 Other long term (current) drug therapy: Secondary | ICD-10-CM | POA: Insufficient documentation

## 2019-01-28 DIAGNOSIS — K219 Gastro-esophageal reflux disease without esophagitis: Secondary | ICD-10-CM | POA: Insufficient documentation

## 2019-01-28 DIAGNOSIS — G8918 Other acute postprocedural pain: Secondary | ICD-10-CM | POA: Diagnosis not present

## 2019-01-28 DIAGNOSIS — K469 Unspecified abdominal hernia without obstruction or gangrene: Secondary | ICD-10-CM | POA: Diagnosis present

## 2019-01-28 DIAGNOSIS — I10 Essential (primary) hypertension: Secondary | ICD-10-CM | POA: Diagnosis not present

## 2019-01-28 DIAGNOSIS — E039 Hypothyroidism, unspecified: Secondary | ICD-10-CM | POA: Diagnosis not present

## 2019-01-28 DIAGNOSIS — Z79890 Hormone replacement therapy (postmenopausal): Secondary | ICD-10-CM | POA: Diagnosis not present

## 2019-01-28 HISTORY — DX: Nonrheumatic mitral (valve) prolapse: I34.1

## 2019-01-28 HISTORY — PX: INGUINAL HERNIA REPAIR: SHX194

## 2019-01-28 LAB — BASIC METABOLIC PANEL
Anion gap: 9 (ref 5–15)
BUN: 10 mg/dL (ref 8–23)
CO2: 28 mmol/L (ref 22–32)
Calcium: 9.4 mg/dL (ref 8.9–10.3)
Chloride: 96 mmol/L — ABNORMAL LOW (ref 98–111)
Creatinine, Ser: 0.81 mg/dL (ref 0.61–1.24)
GFR calc Af Amer: >60 mL/min (ref >60)
GFR calc non Af Amer: >60 mL/min (ref >60)
Glucose, Bld: 107 mg/dL — ABNORMAL HIGH (ref 70–99)
Potassium: 3.9 mmol/L (ref 3.5–5.1)
SODIUM: 133 mmol/L — AB (ref 135–145)

## 2019-01-28 LAB — CBC
HEMATOCRIT: 41.1 % (ref 39.0–52.0)
Hemoglobin: 13.8 g/dL (ref 13.0–17.0)
MCH: 30.3 pg (ref 26.0–34.0)
MCHC: 33.6 g/dL (ref 30.0–36.0)
MCV: 90.1 fL (ref 80.0–100.0)
Platelets: 181 10*3/uL (ref 150–400)
RBC: 4.56 MIL/uL (ref 4.22–5.81)
RDW: 13 % (ref 11.5–15.5)
WBC: 4.7 10*3/uL (ref 4.0–10.5)
nRBC: 0 % (ref 0.0–0.2)

## 2019-01-28 SURGERY — REPAIR, HERNIA, INGUINAL, ADULT
Anesthesia: General | Laterality: Left

## 2019-01-28 MED ORDER — FENTANYL CITRATE (PF) 100 MCG/2ML IJ SOLN
INTRAMUSCULAR | Status: DC | PRN
  Administered 2019-01-28 (×4): 50 ug via INTRAVENOUS

## 2019-01-28 MED ORDER — SUGAMMADEX SODIUM 200 MG/2ML IV SOLN
INTRAVENOUS | Status: DC | PRN
  Administered 2019-01-28: 175 mg via INTRAVENOUS

## 2019-01-28 MED ORDER — ONDANSETRON HCL 4 MG/2ML IJ SOLN
INTRAMUSCULAR | Status: DC | PRN
  Administered 2019-01-28: 4 mg via INTRAVENOUS

## 2019-01-28 MED ORDER — LACTATED RINGERS IV SOLN
INTRAVENOUS | Status: DC | PRN
  Administered 2019-01-28 (×2): via INTRAVENOUS

## 2019-01-28 MED ORDER — MIDAZOLAM HCL 2 MG/2ML IJ SOLN
INTRAMUSCULAR | Status: AC
  Filled 2019-01-28: qty 2

## 2019-01-28 MED ORDER — SODIUM CHLORIDE 0.9% FLUSH: 3.0000 mL | INTRAVENOUS | Status: DC | PRN

## 2019-01-28 MED ORDER — MIDAZOLAM HCL 5 MG/5ML IJ SOLN
INTRAMUSCULAR | Status: DC | PRN
  Administered 2019-01-28: 1 mg via INTRAVENOUS

## 2019-01-28 MED ORDER — LIDOCAINE 2% (20 MG/ML) 5 ML SYRINGE
INTRAMUSCULAR | Status: AC
  Filled 2019-01-28: qty 5

## 2019-01-28 MED ORDER — EPHEDRINE SULFATE-NACL 50-0.9 MG/10ML-% IV SOSY
PREFILLED_SYRINGE | INTRAVENOUS | Status: DC | PRN
  Administered 2019-01-28 (×3): 10 mg via INTRAVENOUS

## 2019-01-28 MED ORDER — GABAPENTIN 100 MG PO CAPS
ORAL_CAPSULE | ORAL | Status: AC
  Administered 2019-01-28: 100 mg via ORAL
  Filled 2019-01-28: qty 1

## 2019-01-28 MED ORDER — SODIUM CHLORIDE 0.9 % IV SOLN: INTRAVENOUS | Status: DC

## 2019-01-28 MED ORDER — PROPOFOL 10 MG/ML IV BOLUS
INTRAVENOUS | Status: AC
  Filled 2019-01-28: qty 20

## 2019-01-28 MED ORDER — CEFAZOLIN SODIUM-DEXTROSE 2-4 GM/100ML-% IV SOLN
2.0000 g | INTRAVENOUS | Status: AC
  Administered 2019-01-28: 2 g via INTRAVENOUS

## 2019-01-28 MED ORDER — ACETAMINOPHEN 500 MG PO TABS
ORAL_TABLET | ORAL | Status: AC
  Administered 2019-01-28: 1000 mg via ORAL
  Filled 2019-01-28: qty 2

## 2019-01-28 MED ORDER — BUPIVACAINE HCL (PF) 0.25 % IJ SOLN
INTRAMUSCULAR | Status: AC
  Filled 2019-01-28: qty 30

## 2019-01-28 MED ORDER — ACETAMINOPHEN 325 MG PO TABS: 650.0000 mg | ORAL_TABLET | ORAL | Status: DC | PRN

## 2019-01-28 MED ORDER — BUPIVACAINE LIPOSOME 1.3 % IJ SUSP
INTRAMUSCULAR | Status: DC | PRN
  Administered 2019-01-28: 10 mL

## 2019-01-28 MED ORDER — BUPIVACAINE HCL (PF) 0.25 % IJ SOLN
INTRAMUSCULAR | Status: DC | PRN
  Administered 2019-01-28: 10 mL

## 2019-01-28 MED ORDER — GABAPENTIN 100 MG PO CAPS
100.0000 mg | ORAL_CAPSULE | ORAL | Status: AC
  Administered 2019-01-28: 100 mg via ORAL

## 2019-01-28 MED ORDER — OXYCODONE HCL 5 MG PO TABS
5.0000 mg | ORAL_TABLET | ORAL | Status: DC | PRN
  Administered 2019-01-28: 5 mg via ORAL

## 2019-01-28 MED ORDER — OXYCODONE HCL 5 MG PO TABS: 5.0000 mg | ORAL_TABLET | Freq: Four times a day (QID) | ORAL | 0 refills | Status: DC | PRN

## 2019-01-28 MED ORDER — DEXAMETHASONE SODIUM PHOSPHATE 10 MG/ML IJ SOLN
INTRAMUSCULAR | Status: AC
  Filled 2019-01-28: qty 1

## 2019-01-28 MED ORDER — BUPIVACAINE HCL (PF) 0.5 % IJ SOLN
INTRAMUSCULAR | Status: DC | PRN
  Administered 2019-01-28: 20 mL

## 2019-01-28 MED ORDER — ROCURONIUM BROMIDE 10 MG/ML (PF) SYRINGE
PREFILLED_SYRINGE | INTRAVENOUS | Status: DC | PRN
  Administered 2019-01-28: 10 mg via INTRAVENOUS
  Administered 2019-01-28: 50 mg via INTRAVENOUS

## 2019-01-28 MED ORDER — 0.9 % SODIUM CHLORIDE (POUR BTL) OPTIME
TOPICAL | Status: DC | PRN
  Administered 2019-01-28: 1000 mL

## 2019-01-28 MED ORDER — FENTANYL CITRATE (PF) 100 MCG/2ML IJ SOLN: 25.0000 ug | INTRAMUSCULAR | Status: DC | PRN

## 2019-01-28 MED ORDER — DEXAMETHASONE SODIUM PHOSPHATE 10 MG/ML IJ SOLN
INTRAMUSCULAR | Status: DC | PRN
  Administered 2019-01-28: 5 mg via INTRAVENOUS

## 2019-01-28 MED ORDER — LIDOCAINE 2% (20 MG/ML) 5 ML SYRINGE: INTRAMUSCULAR | Status: DC | PRN

## 2019-01-28 MED ORDER — CEFAZOLIN SODIUM-DEXTROSE 2-4 GM/100ML-% IV SOLN
INTRAVENOUS | Status: AC
  Filled 2019-01-28: qty 100

## 2019-01-28 MED ORDER — SODIUM CHLORIDE 0.9 % IV SOLN: 250.0000 mL | INTRAVENOUS | Status: DC | PRN

## 2019-01-28 MED ORDER — LIDOCAINE 2% (20 MG/ML) 5 ML SYRINGE
INTRAMUSCULAR | Status: DC | PRN
  Administered 2019-01-28: 40 mg via INTRAVENOUS

## 2019-01-28 MED ORDER — MORPHINE SULFATE (PF) 2 MG/ML IV SOLN: 2.0000 mg | INTRAVENOUS | Status: DC | PRN

## 2019-01-28 MED ORDER — PROPOFOL 10 MG/ML IV BOLUS
INTRAVENOUS | Status: DC | PRN
  Administered 2019-01-28: 40 mg via INTRAVENOUS
  Administered 2019-01-28: 60 mg via INTRAVENOUS

## 2019-01-28 MED ORDER — FENTANYL CITRATE (PF) 250 MCG/5ML IJ SOLN
INTRAMUSCULAR | Status: AC
  Filled 2019-01-28: qty 5

## 2019-01-28 MED ORDER — ONDANSETRON HCL 4 MG/2ML IJ SOLN
INTRAMUSCULAR | Status: AC
  Filled 2019-01-28: qty 2

## 2019-01-28 MED ORDER — ROCURONIUM BROMIDE 50 MG/5ML IV SOSY
PREFILLED_SYRINGE | INTRAVENOUS | Status: AC
  Filled 2019-01-28: qty 5

## 2019-01-28 MED ORDER — ACETAMINOPHEN 650 MG RE SUPP: 650.0000 mg | RECTAL | Status: DC | PRN

## 2019-01-28 MED ORDER — SODIUM CHLORIDE 0.9% FLUSH: 3.0000 mL | Freq: Two times a day (BID) | INTRAVENOUS | Status: DC

## 2019-01-28 MED ORDER — ACETAMINOPHEN 500 MG PO TABS
1000.0000 mg | ORAL_TABLET | ORAL | Status: AC
  Administered 2019-01-28: 1000 mg via ORAL

## 2019-01-28 MED ORDER — OXYCODONE HCL 5 MG PO TABS
ORAL_TABLET | ORAL | Status: AC
  Administered 2019-01-28: 5 mg via ORAL
  Filled 2019-01-28: qty 1

## 2019-01-28 SURGICAL SUPPLY — 53 items
BLADE CLIPPER SURG (BLADE) ×3 IMPLANT
BLADE SURG 10 STRL SS (BLADE) ×3 IMPLANT
BLADE SURG 15 STRL LF DISP TIS (BLADE) ×1 IMPLANT
BLADE SURG 15 STRL SS (BLADE) ×2
CANISTER SUCT 3000ML PPV (MISCELLANEOUS) ×3 IMPLANT
CHLORAPREP W/TINT 26ML (MISCELLANEOUS) ×3 IMPLANT
CLOSURE WOUND 1/2 X4 (GAUZE/BANDAGES/DRESSINGS) ×1
COVER SURGICAL LIGHT HANDLE (MISCELLANEOUS) ×3 IMPLANT
COVER WAND RF STERILE (DRAPES) IMPLANT
DECANTER SPIKE VIAL GLASS SM (MISCELLANEOUS) ×3 IMPLANT
DERMABOND ADHESIVE PROPEN (GAUZE/BANDAGES/DRESSINGS) ×2
DERMABOND ADVANCED .7 DNX6 (GAUZE/BANDAGES/DRESSINGS) ×1 IMPLANT
DRAIN PENROSE 1/2X12 LTX STRL (WOUND CARE) ×3 IMPLANT
DRAPE LAPAROTOMY TRNSV 102X78 (DRAPE) ×3 IMPLANT
DRAPE UTILITY XL STRL (DRAPES) ×3 IMPLANT
ELECT CAUTERY BLADE 6.4 (BLADE) ×3 IMPLANT
ELECT REM PT RETURN 9FT ADLT (ELECTROSURGICAL) ×3
ELECTRODE REM PT RTRN 9FT ADLT (ELECTROSURGICAL) ×1 IMPLANT
GAUZE 4X4 16PLY RFD (DISPOSABLE) ×3 IMPLANT
GLOVE BIO SURGEON STRL SZ 6.5 (GLOVE) ×2 IMPLANT
GLOVE BIO SURGEON STRL SZ7 (GLOVE) ×3 IMPLANT
GLOVE BIO SURGEON STRL SZ7.5 (GLOVE) ×3 IMPLANT
GLOVE BIO SURGEONS STRL SZ 6.5 (GLOVE) ×1
GLOVE BIOGEL PI IND STRL 6.5 (GLOVE) ×1 IMPLANT
GLOVE BIOGEL PI IND STRL 7.5 (GLOVE) ×2 IMPLANT
GLOVE BIOGEL PI INDICATOR 6.5 (GLOVE) ×2
GLOVE BIOGEL PI INDICATOR 7.5 (GLOVE) ×4
GOWN STRL REUS W/ TWL LRG LVL3 (GOWN DISPOSABLE) ×3 IMPLANT
GOWN STRL REUS W/TWL LRG LVL3 (GOWN DISPOSABLE) ×6
KIT BASIN OR (CUSTOM PROCEDURE TRAY) ×3 IMPLANT
KIT TURNOVER KIT B (KITS) ×3 IMPLANT
MARKER SKIN DUAL TIP RULER LAB (MISCELLANEOUS) ×3 IMPLANT
MESH ULTRAPRO 3X6 7.6X15CM (Mesh General) ×3 IMPLANT
NEEDLE HYPO 25GX1X1/2 BEV (NEEDLE) ×3 IMPLANT
NS IRRIG 1000ML POUR BTL (IV SOLUTION) ×3 IMPLANT
PACK SURGICAL SETUP 50X90 (CUSTOM PROCEDURE TRAY) ×3 IMPLANT
PAD ARMBOARD 7.5X6 YLW CONV (MISCELLANEOUS) ×3 IMPLANT
PENCIL BUTTON HOLSTER BLD 10FT (ELECTRODE) ×3 IMPLANT
SPONGE LAP 18X18 RF (DISPOSABLE) ×3 IMPLANT
STRIP CLOSURE SKIN 1/2X4 (GAUZE/BANDAGES/DRESSINGS) ×2 IMPLANT
SUT MNCRL AB 4-0 PS2 18 (SUTURE) ×3 IMPLANT
SUT VIC AB 2-0 CT1 27 (SUTURE) ×2
SUT VIC AB 2-0 CT1 TAPERPNT 27 (SUTURE) ×1 IMPLANT
SUT VIC AB 2-0 SH 18 (SUTURE) ×9 IMPLANT
SUT VIC AB 3-0 SH 27 (SUTURE) ×2
SUT VIC AB 3-0 SH 27XBRD (SUTURE) ×1 IMPLANT
SUT VICRYL AB 2 0 TIES (SUTURE) ×3 IMPLANT
SYR CONTROL 10ML LL (SYRINGE) ×3 IMPLANT
TOWEL OR 17X24 6PK STRL BLUE (TOWEL DISPOSABLE) IMPLANT
TOWEL OR 17X26 10 PK STRL BLUE (TOWEL DISPOSABLE) ×3 IMPLANT
TUBE CONNECTING 12'X1/4 (SUCTIONS) ×1
TUBE CONNECTING 12X1/4 (SUCTIONS) ×2 IMPLANT
YANKAUER SUCT BULB TIP NO VENT (SUCTIONS) ×3 IMPLANT

## 2019-01-28 NOTE — Discharge Instructions (Signed)
[facility]   INGUINAL HERNIA REPAIR: POST OP INSTRUCTIONS Take 650 mg tylenol every six hours or take ibuprofen 400 mg every 8 hours (can also use alleve) for next 72 hours. Use oxycodone to supplement. Use ice several times daily. Always review your discharge instruction sheet given to you by the facility where your surgery was performed. IF YOU HAVE DISABILITY OR FAMILY LEAVE FORMS, YOU MUST BRING THEM TO THE OFFICE FOR PROCESSING.   DO NOT GIVE THEM TO YOUR DOCTOR.  1. A  prescription for pain medication may be given to you upon discharge.  Take your pain medication as prescribed, if needed.  If narcotic pain medicine is not needed, then you may take acetaminophen (Tylenol), naprosyn (Alleve) or ibuprofen (Advil) as needed. 2. Take your usually prescribed medications unless otherwise directed. 3. If you need a refill on your pain medication, please contact your pharmacy.  They will contact our office to request authorization. Prescriptions will not be filled after 5 pm or on week-ends. 4. You should follow a light diet the first 24 hours after arrival home, such as soup and crackers, etc.  Be sure to include lots of fluids daily.  Resume your normal diet the day after surgery. 5. Most patients will experience some swelling and bruising around the umbilicus or in the groin and scrotum.  Ice packs and reclining will help.  Swelling and bruising can take several days to resolve.  6. It is common to experience some constipation if taking pain medication after surgery.  Increasing fluid intake and taking a stool softener (such as Colace) will usually help or prevent this problem from occurring.  A mild laxative (Milk of Magnesia or Miralax) should be taken according to package directions if there are no bowel movements after 48 hours. 7. Unless discharge instructions indicate otherwise, you may remove your bandages 48 hours after surgery, and you may shower at that time.  You may  have steri-strips (small skin tapes) in place directly over the incision.  These strips should be left on the skin for 7-10 days and will come off on their own.  If your surgeon used skin glue on the incision, you may shower in 24 hours.  The glue will flake off over the next 2-3 weeks.  Any sutures or staples will be removed at the office during your follow-up visit. 8. ACTIVITIES:  You may resume regular (light) daily activities beginning the next day--such as daily self-care, walking, climbing stairs--gradually increasing activities as tolerated.  You may have sexual intercourse when it is comfortable.  Refrain from any heavy lifting or straining until approved by your doctor. a. You may drive when you are no longer taking prescription pain medication, you can comfortably wear a seatbelt, and you can safely maneuver your car and apply brakes. b. RETURN TO WORK:  __________________________________________________________ 9. You should see your doctor in the office for a follow-up appointment approximately 2-3 weeks after your surgery.  Make sure that you call for this appointment within a day or two after you arrive home to insure a convenient appointment time. 10. OTHER INSTRUCTIONS:  __________________________________________________________________________________________________________________________________________________________________________________________  WHEN TO CALL YOUR DOCTOR: 1. Fever over 101.0 2. Inability to urinate 3. Nausea and/or vomiting 4. Extreme swelling or bruising 5. Continued bleeding from incision. 6. Increased pain, redness, or drainage from the incision  The clinic staff is available to answer your questions during regular business hours.  Please dont hesitate to call and ask to speak to  one of the nurses for clinical concerns.  If you have a medical emergency, go to the nearest emergency room or call 911.  A surgeon from [facility] is always on call  at the hospital   [address], [place], [place], [place]  [address] ?  [address], [place], [place]   [address] [phone] ? [phone] ? FAX [phone] Web site: [email or web address]   Post Anesthesia Home Care Instructions  Activity: Get plenty of rest for the remainder of the day. A responsible individual must stay with you for 24 hours following the procedure.  For the next 24 hours, DO NOT: -Drive a car -Paediatric nurse -Drink alcoholic beverages -Take any medication unless instructed by your physician -Make any legal decisions or sign important papers.  Meals: Start with liquid foods such as gelatin or soup. Progress to regular foods as tolerated. Avoid greasy, spicy, heavy foods. If nausea and/or vomiting occur, drink only clear liquids until the nausea and/or vomiting subsides. Call your physician if vomiting continues.  Special Instructions/Symptoms: Your throat may feel dry or sore from the anesthesia or the breathing tube placed in your throat during surgery. If this causes discomfort, gargle with warm salt water. The discomfort should disappear within 24 hours.  If you had a scopolamine patch placed behind your ear for the management of post- operative nausea and/or vomiting:  1. The medication in the patch is effective for 72 hours, after which it should be removed.  Wrap patch in a tissue and discard in the trash. Wash hands thoroughly with soap and water. 2. You may remove the patch earlier than 72 hours if you experience unpleasant side effects which may include dry mouth, dizziness or visual disturbances. 3. Avoid touching the patch. Wash your hands with soap and water after contact with the patch.

## 2019-01-28 NOTE — Op Note (Signed)
Preoperative diagnosis: recurrent left inguinal hernia, pantaloon Postoperative diagnosis: Same as above Procedure: Open repair of recurrent left inguinal hernia with ultrapro mesh patch Surgeon: Dr. Serita Grammes Anesthesia: General with TAP block Estimated blood loss: Minimal Drains: None Complications: None Specimens: none Sponge and needle count was correct at completion Disposition to recovery stable condition  Indications: this is 79 yom with cardiac comorbidities who has symptomatic left inguinal hernia. He was not sure if he had this side repaired before and I could not find a scar.  I discussed going to or for open repair of this hernia.  Procedure: After informed consent obtained patient first underwent a tap block with exparel. He was given antibiotics. He had SCDs in place. He was placed under general anesthesia without complication.  He was prepped and draped in standard sterile surgical fashion.  A surgical timeout was performed.  I infiltrated marcaine in the left groin. I still could not identify a scar.  I then made a left inguinal incision. I carried this through scarpas fascia and identified the external oblique.  I entered this. He did have some suture material once I entered the inguinal canal.  I then dissected the spermatic cord and what was a very large hernia off the floor and encircled this with a penrose drain.  I then removed the pantaloon hernia sac from the cord structures.  I excised some of the hernia sac after entering it and sewed this close with 2-0 vicryl.  I then closed the internal oblique to the inguinal ligament with 2-0 vicryl suture. I fashioned a piece of ultrapro mesh to cover this. I sutured this to the pubic tubercle several times with 2-0 vicryl I then sutured it to the inguinal ligament every half centimeter. I made a cut in the mesh and wrapped around the spermatic cord.  I laid the lateral portion flat and sutured the cut together and to the fascia.  The mesh gave good coverage. Hemostasis was obtained. I closed the external oblique with 2-0 vicryl and scarpas with 3-0 vicryl. The skin was closed with 4-0 monocryl and dermabond placed. He tolerated well and was transferred to pacu stable.

## 2019-01-28 NOTE — Transfer of Care (Signed)
Immediate Anesthesia Transfer of Care Note  Patient: CARMEL WADDINGTON  Procedure(s) Performed: LEFT INGUINAL HERNIA REPAIR WITH MESH (Left )  Patient Location: PACU  Anesthesia Type:General  Level of Consciousness: awake, oriented and patient cooperative  Airway & Oxygen Therapy: Patient Spontanous Breathing and Patient connected to nasal cannula oxygen  Post-op Assessment: Report given to RN and Post -op Vital signs reviewed and stable  Post vital signs: Reviewed  Last Vitals:  Vitals Value Taken Time  BP 141/69 01/28/2019 10:03 AM  Temp    Pulse 77 01/28/2019 10:06 AM  Resp 16 01/28/2019 10:06 AM  SpO2 100 % 01/28/2019 10:06 AM  Vitals shown include unvalidated device data.  Last Pain:  Vitals:   01/28/19 0707  TempSrc:   PainSc: 0-No pain      Patients Stated Pain Goal: 0 (77/37/50 5107)  Complications: No apparent anesthesia complications

## 2019-01-28 NOTE — Anesthesia Postprocedure Evaluation (Signed)
Anesthesia Post Note  Patient: Cristian Nunez  Procedure(s) Performed: LEFT INGUINAL HERNIA REPAIR WITH MESH (Left )     Patient location during evaluation: PACU Anesthesia Type: General and Regional Level of consciousness: awake and alert Pain management: pain level controlled Vital Signs Assessment: post-procedure vital signs reviewed and stable Respiratory status: spontaneous breathing, nonlabored ventilation and respiratory function stable Cardiovascular status: blood pressure returned to baseline and stable Postop Assessment: no apparent nausea or vomiting Anesthetic complications: no    Last Vitals:  Vitals:   01/28/19 1050 01/28/19 1105  BP: 128/70 (!) 143/75  Pulse: 79   Resp: 13   Temp:    SpO2: 100% 100%    Last Pain:  Vitals:   01/28/19 1045  TempSrc:   PainSc: 3                  Kristalynn Coddington,W. EDMOND

## 2019-01-28 NOTE — Anesthesia Procedure Notes (Signed)
Procedure Name: Intubation Date/Time: 01/28/2019 8:38 AM Performed by: Jenne Campus, CRNA Pre-anesthesia Checklist: Patient identified, Emergency Drugs available, Suction available and Patient being monitored Patient Re-evaluated:Patient Re-evaluated prior to induction Oxygen Delivery Method: Circle System Utilized Preoxygenation: Pre-oxygenation with 100% oxygen Induction Type: IV induction Ventilation: Mask ventilation without difficulty, Oral airway inserted - appropriate to patient size and Two handed mask ventilation required Laryngoscope Size: Miller and 3 Grade View: Grade III Tube type: Oral Tube size: 7.5 mm Number of attempts: 1 Airway Equipment and Method: Oral airway and Bougie stylet Placement Confirmation: ETT inserted through vocal cords under direct vision,  positive ETCO2 and breath sounds checked- equal and bilateral Secured at: 22 cm Tube secured with: Tape Dental Injury: Teeth and Oropharynx as per pre-operative assessment

## 2019-01-28 NOTE — Interval H&P Note (Signed)
History and Physical Interval Note:  01/28/2019 7:50 AM  Cristian Nunez  has presented today for surgery, with the diagnosis of LEFT INGUINAL HERNIA  The various methods of treatment have been discussed with the patient and family. After consideration of risks, benefits and other options for treatment, the patient has consented to  Procedure(s) with comments: LEFT INGUINAL HERNIA REPAIR WITH MESH (Left) - GENERAL ANESTHESIA AND TAP BLOCK as a surgical intervention .  The patient's history has been reviewed, patient examined, no change in status, stable for surgery.  I have reviewed the patient's chart and labs.  Questions were answered to the patient's satisfaction.     Rolm Bookbinder

## 2019-01-28 NOTE — H&P (Signed)
Cristian Nunez is an 73 y.o. male.   Chief Complaint: lih HPI: 51 yof who I saw a couple years ago with lih that was asymptomatic. he thinks might have had repaired before but doesnt remember anything about it. the bulge has gotten much bigger, it now stays out, interferes with activities. his wife states it is giving him discomfort now as well. he is on Taltz monthly for psoriasis and is due soon. he is otherwise healthy and active. he has some constipation   Past Medical History:  Diagnosis Date  . Arthritis    psoriatic  . Cancer ([place])    thyroid  . GERD (gastroesophageal reflux disease)   . Hyperlipidemia   . Hypertension   . 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 Mitral valve prolapse     Past Surgical History:  Procedure Laterality Date  . HERNIA REPAIR Bilateral   . KNEE ARTHROSCOPY    . THYROIDECTOMY, PARTIAL    . VARICOSE VEIN SURGERY      History reviewed. No pertinent family history. Social History:  reports that he has never smoked. He has never used smokeless tobacco. He reports current alcohol use of about 7.0 standard drinks of alcohol per week. He reports that he does not use drugs.  Allergies: No Known Allergies  Medications Prior to Admission  Medication Sig Dispense Refill  . amLODipine (NORVASC) 2.5 MG tablet Take 2.5 mg by mouth daily.    Marland Kitchen aspirin-acetaminophen-caffeine (EXCEDRIN MIGRAINE) 250-250-65 MG tablet Take 1 tablet by mouth every 6 (six) hours as needed for headache.    . b complex vitamins tablet Take 1 tablet by mouth daily.    . Biotin 5 MG CAPS Take 5 mg by mouth daily.    . Calcium Citrate-Vitamin D (CALCIUM + D PO) Take 1 tablet by mouth daily.    . fexofenadine (ALLEGRA) 180 MG tablet Take 180 mg by mouth daily as needed for allergies or rhinitis.    Marland Kitchen levothyroxine (SYNTHROID, LEVOTHROID) 100 MCG tablet Take 100 mcg by mouth daily before breakfast.    . methocarbamol (ROBAXIN) 500 MG tablet Take 500 mg by mouth every 12 (twelve)  hours as needed for muscle pain. for pain    . olmesartan (BENICAR) 40 MG tablet Take 40 mg by mouth daily.    Marland Kitchen omeprazole (PRILOSEC) 40 MG capsule Take 40 mg by mouth daily.    . pseudoephedrine (SUDAFED) 30 MG tablet Take 30 mg by mouth daily as needed for congestion.    . rosuvastatin (CRESTOR) 10 MG tablet Take 10 mg by mouth daily.    . vitamin B-12 (CYANOCOBALAMIN) 1000 MCG tablet Take 1,000 mcg by mouth 2 (two) times a week.    . Ixekizumab (TALTZ) 80 MG/ML SOSY Inject 80 mg into the skin every 30 (thirty) days.     . naproxen sodium (ALEVE) 220 MG tablet Take 220 mg by mouth daily as needed (pain).    . vitamin E 400 UNIT capsule Take 400 Units by mouth daily.      Results for orders placed or performed during the hospital encounter of 01/28/19 (from the past 48 hour(s))  Basic metabolic panel     Status: Abnormal   Collection Time: 01/28/19  6:59 AM  Result Value Ref Range   Sodium 133 (L) 135 - 145 mmol/L   Potassium 3.9 3.5 - 5.1 mmol/L   Chloride 96 (L) 98 - 111 mmol/L   CO2 28 22 - 32 mmol/L  Glucose, Bld 107 (H) 70 - 99 mg/dL   BUN 10 8 - 23 mg/dL   Creatinine, Ser 0.81 0.61 - 1.24 mg/dL   Calcium 9.4 8.9 - 10.3 mg/dL   GFR calc non Af Amer >60 >60 mL/min   GFR calc Af Amer >60 >60 mL/min   Anion gap 9 5 - 15    Comment: Performed at [facility] [address], [place], [place] [address]   No results found.  Review of Systems  Musculoskeletal: Negative for back pain.  All other systems reviewed and are negative.   Blood pressure (!) 146/76, pulse 76, temperature (!) 97.4 F (36.3 C), temperature source Oral, resp. rate 20, height 5\' 9"  (1.753 m), weight 74.8 kg, SpO2 100 %. Physical Exam  General Mental Status-Alert. Orientation-Oriented X3. Abdomen Note: soft nt/nd diastasis present nonreducible large lih no rih cv rrr Lungs clear  Assessment/Plan RECURRENT INGUINAL HERNIA (K40.91) not sure if recurrent I cannot really see scar  today. will hold taltz this month We discussed observation versus repair. We discussed both laparoscopic and open inguinal hernia repairs. I described the procedure in detail. The patient was given educational material. Goals should be achieved with surgery. We discussed the usage of mesh and the rationale behind that. We went over the pathophysiology of inguinal hernia. We have elected to perform open inguinal hernia repair with mesh. We discussed the risks including bleeding, infection, recurrence, postoperative pain and chronic groin pain, testicular injury, urinary retention, numbness in groin and around incision.   Rolm Bookbinder, MD 01/28/2019, 7:48 AM

## 2019-01-28 NOTE — Anesthesia Procedure Notes (Signed)
Anesthesia Regional Block: TAP block   Pre-Anesthetic Checklist: ,, timeout performed, Correct Patient, Correct Site, Correct Laterality, Correct Procedure, Correct Position, site marked, Risks and benefits discussed, pre-op evaluation,  At surgeon's request and post-op pain management  Laterality: Left  Prep: Maximum Sterile Barrier Precautions used, chloraprep       Needles:  Injection technique: Single-shot  Needle Type: Echogenic Stimulator Needle     Needle Length: 9cm  Needle Gauge: 21     Additional Needles:   Narrative:  Start time: 01/28/2019 7:58 AM End time: 01/28/2019 8:08 AM Injection made incrementally with aspirations every 5 mL. Anesthesiologist: Roderic Palau, MD  Additional Notes: 2% Lidocaine skin wheel.

## 2019-01-29 ENCOUNTER — Encounter (HOSPITAL_COMMUNITY): Payer: Self-pay | Admitting: General Surgery

## 2019-03-03 ENCOUNTER — Ambulatory Visit: Payer: Medicare Other | Admitting: Cardiology

## 2019-03-09 ENCOUNTER — Ambulatory Visit: Payer: Medicare Other | Admitting: Cardiology

## 2019-03-27 DIAGNOSIS — E782 Mixed hyperlipidemia: Secondary | ICD-10-CM | POA: Diagnosis not present

## 2019-03-27 DIAGNOSIS — I10 Essential (primary) hypertension: Secondary | ICD-10-CM | POA: Diagnosis not present

## 2019-03-27 DIAGNOSIS — K219 Gastro-esophageal reflux disease without esophagitis: Secondary | ICD-10-CM | POA: Diagnosis not present

## 2019-03-27 DIAGNOSIS — E039 Hypothyroidism, unspecified: Secondary | ICD-10-CM | POA: Diagnosis not present

## 2019-04-27 DIAGNOSIS — E785 Hyperlipidemia, unspecified: Secondary | ICD-10-CM | POA: Diagnosis not present

## 2019-04-27 DIAGNOSIS — Z9181 History of falling: Secondary | ICD-10-CM | POA: Diagnosis not present

## 2019-04-27 DIAGNOSIS — Z125 Encounter for screening for malignant neoplasm of prostate: Secondary | ICD-10-CM | POA: Diagnosis not present

## 2019-04-27 DIAGNOSIS — Z0000 Encounter for general adult medical examination without abnormal findings: Secondary | ICD-10-CM | POA: Diagnosis not present

## 2019-04-27 DIAGNOSIS — Z1331 Encounter for screening for depression: Secondary | ICD-10-CM | POA: Diagnosis not present

## 2019-04-30 ENCOUNTER — Telehealth: Payer: Self-pay

## 2019-04-30 NOTE — Telephone Encounter (Signed)
[place], DDS called asking if patient needs to be on an antibiotic for cardiac reasons for a dental procedure. RN advised that she would forward message to Dr.RRR for advisement?

## 2019-04-30 NOTE — Telephone Encounter (Signed)
By current guidelines, no pro[phylaxis recommended

## 2019-05-18 ENCOUNTER — Telehealth: Payer: Self-pay | Admitting: Cardiology

## 2019-05-18 NOTE — Telephone Encounter (Signed)
Virtual Visit Pre-Appointment Phone Call  "(Name), I am calling you today to discuss your upcoming appointment. We are currently trying to limit exposure to the virus that causes COVID-19 by seeing patients at home rather than in the office."  1. "What is the BEST phone number to call the day of the visit?" - include this in appointment notes  2. Do you have or have access to (through a family member/friend) a smartphone with video capability that we can use for your visit?" a. If yes - list this number in appt notes as cell (if different from BEST phone #) and list the appointment type as a VIDEO visit in appointment notes b. If no - list the appointment type as a PHONE visit in appointment notes  3. Confirm consent - "In the setting of the current Covid19 crisis, you are scheduled for a (phone or video) visit with your provider on (date) at (time).  Just as we do with many in-office visits, in order for you to participate in this visit, we must obtain consent.  If you'd like, I can send this to your mychart (if signed up) or email for you to review.  Otherwise, I can obtain your verbal consent now.  All virtual visits are billed to your insurance company just like a normal visit would be.  By agreeing to a virtual visit, we'd like you to understand that the technology does not allow for your provider to perform an examination, and thus may limit your provider's ability to fully assess your condition. If your provider identifies any concerns that need to be evaluated in person, we will make arrangements to do so.  Finally, though the technology is pretty good, we cannot assure that it will always work on either your or our end, and in the setting of a video visit, we may have to convert it to a phone-only visit.  In either situation, we cannot ensure that we have a secure connection.  Are you willing to proceed?" STAFF: Did the patient verbally acknowledge consent to telehealth visit? Document  YES/NO here: YES  4. Advise patient to be prepared - "Two hours prior to your appointment, go ahead and check your blood pressure, pulse, oxygen saturation, and your weight (if you have the equipment to check those) and write them all down. When your visit starts, your provider will ask you for this information. If you have an Apple Watch or Kardia device, please plan to have heart rate information ready on the day of your appointment. Please have a pen and paper handy nearby the day of the visit as well."  5. Give patient instructions for MyChart download to smartphone OR Doximity/Doxy.me as below if video visit (depending on what platform provider is using)  6. Inform patient they will receive a phone call 15 minutes prior to their appointment time (may be from unknown caller ID) so they should be prepared to answer    TELEPHONE CALL NOTE  KADAR CHANCE has been deemed a candidate for a follow-up tele-health visit to limit community exposure during the Covid-19 pandemic. I spoke with the patient via phone to ensure availability of phone/video source, confirm preferred email & phone number, and discuss instructions and expectations.  I reminded JAMMIE CLINK to be prepared with any vital sign and/or heart rhythm information that could potentially be obtained via home monitoring, at the time of his visit. I reminded REGINO FOURNET to expect a phone call prior to  his visit.  Frederic Jericho 05/18/2019 11:42 AM   INSTRUCTIONS FOR DOWNLOADING THE MYCHART APP TO SMARTPHONE  - The patient must first make sure to have activated MyChart and know their login information - If Apple, go to [employer] and type in MyChart in the search bar and download the app. If Android, ask patient to go to [employer] and type in [place] in the search bar and download the app. The app is free but as with any other app downloads, their phone may require them to verify saved payment information or Apple/Android  password.  - The patient will need to then log into the app with their MyChart username and password, and select [facility] as their healthcare provider to link the account. When it is time for your visit, go to the MyChart app, find appointments, and click Begin Video Visit. Be sure to Select Allow for your device to access the Microphone and Camera for your visit. You will then be connected, and your provider will be with you shortly.  **If they have any issues connecting, or need assistance please contact MyChart service desk [phone])**  **If using a computer, in order to ensure the best quality for their visit they will need to use either of the following Internet Browsers: [employer], or Google Chrome**  IF USING DOXIMITY or DOXY.ME - The patient will receive a link just prior to their visit by text.     FULL LENGTH CONSENT FOR TELE-HEALTH VISIT   I hereby voluntarily request, consent and authorize [facility] and its employed or contracted physicians, physician assistants, nurse practitioners or other licensed health care professionals (the Practitioner), to provide me with telemedicine health care services (the Services") as deemed necessary by the treating Practitioner. I acknowledge and consent to receive the Services by the Practitioner via telemedicine. I understand that the telemedicine visit will involve communicating with the Practitioner through live audiovisual communication technology and the disclosure of certain medical information by electronic transmission. I acknowledge that I have been given the opportunity to request an in-person assessment or other available alternative prior to the telemedicine visit and am voluntarily participating in the telemedicine visit.  I understand that I have the right to withhold or withdraw my consent to the use of telemedicine in the course of my care at any time, without affecting my right to future care or treatment,  and that the Practitioner or I may terminate the telemedicine visit at any time. I understand that I have the right to inspect all information obtained and/or recorded in the course of the telemedicine visit and may receive copies of available information for a reasonable fee.  I understand that some of the potential risks of receiving the Services via telemedicine include:   Delay or interruption in medical evaluation due to technological equipment failure or disruption;  Information transmitted may not be sufficient (e.g. poor resolution of images) to allow for appropriate medical decision making by the Practitioner; and/or   In rare instances, security protocols could fail, causing a breach of personal health information.  Furthermore, I acknowledge that it is my responsibility to provide information about my medical history, conditions and care that is complete and accurate to the best of my ability. I acknowledge that Practitioner's advice, recommendations, and/or decision may be based on factors not within their control, such as incomplete or inaccurate data provided by me or distortions of diagnostic images or specimens that may result from electronic transmissions. I  understand that the practice of medicine is not an exact science and that Practitioner makes no warranties or guarantees regarding treatment outcomes. I acknowledge that I will receive a copy of this consent concurrently upon execution via email to the email address I last provided but may also request a printed copy by calling the office of [facility].    I understand that my insurance will be billed for this visit.   I have read or had this consent read to me.  I understand the contents of this consent, which adequately explains the benefits and risks of the Services being provided via telemedicine.   I have been provided ample opportunity to ask questions regarding this consent and the Services and have had my questions  answered to my satisfaction.  I give my informed consent for the services to be provided through the use of telemedicine in my medical care  By participating in this telemedicine visit I agree to the above.

## 2019-05-19 ENCOUNTER — Telehealth (INDEPENDENT_AMBULATORY_CARE_PROVIDER_SITE_OTHER): Payer: Medicare Other | Admitting: Cardiology

## 2019-05-19 ENCOUNTER — Other Ambulatory Visit: Payer: Self-pay

## 2019-05-19 ENCOUNTER — Encounter: Payer: Self-pay | Admitting: Cardiology

## 2019-05-19 VITALS — BP 141/84 | HR 79 | Ht 69.0 in | Wt 167.0 lb

## 2019-05-19 DIAGNOSIS — I10 Essential (primary) hypertension: Secondary | ICD-10-CM

## 2019-05-19 DIAGNOSIS — E782 Mixed hyperlipidemia: Secondary | ICD-10-CM

## 2019-05-19 DIAGNOSIS — Z1329 Encounter for screening for other suspected endocrine disorder: Secondary | ICD-10-CM

## 2019-05-19 DIAGNOSIS — R9431 Abnormal electrocardiogram [ECG] [EKG]: Secondary | ICD-10-CM

## 2019-05-19 NOTE — Addendum Note (Signed)
Addended by: Beckey Rutter on: 05/19/2019 11:02 AM   Modules accepted: Orders

## 2019-05-19 NOTE — Patient Instructions (Signed)
Medication Instructions:  Your physician recommends that you continue on your current medications as directed. Please refer to the Current Medication list given to you today.  If you need a refill on your cardiac medications before your next appointment, please call your pharmacy.   Lab work: Your physician recommends that you return FASTING for lipid, liver, TSH, CBC and BMP.  If you have labs (blood work) drawn today and your tests are completely normal, you will receive your results only by: Marland Kitchen MyChart Message (if you have MyChart) OR . A paper copy in the mail If you have any lab test that is abnormal or we need to change your treatment, we will call you to review the results.  Testing/Procedures: NONE  Follow-Up: At [facility], you and your health needs are our priority.  As part of our continuing mission to provide you with exceptional heart care, we have created designated Provider Care Teams.  These Care Teams include your primary Cardiologist (physician) and Advanced Practice Providers (APPs -  Physician Assistants and Nurse Practitioners) who all work together to provide you with the care you need, when you need it. You will need a follow up appointment in 6 months.

## 2019-05-19 NOTE — Progress Notes (Signed)
Virtual Visit via Video Note   This visit type was conducted due to national recommendations for restrictions regarding the COVID-19 Pandemic (e.g. social distancing) in an effort to limit this patient's exposure and mitigate transmission in our community.  Due to his co-morbid illnesses, this patient is at least at moderate risk for complications without adequate follow up.  This format is felt to be most appropriate for this patient at this time.  All issues noted in this document were discussed and addressed.  A limited physical exam was performed with this format.  Please refer to the patient's chart for his consent to telehealth for [facility].   Date:  05/19/2019   ID:  Cristian Nunez, DOB 11/02/1946, MRN [id number]  Patient Location: Home Provider Location: Office  PCP:  Renaldo Reel, PA  Cardiologist:  No primary care provider on file.  Electrophysiologist:  None   Evaluation Performed:  Follow-Up Visit  Chief Complaint: Essential hypertension  History of Present Illness:    Cristian Nunez is a 73 y.o. male with past medical history of abnormal EKG, essential hypertension and dyslipidemia.  He denies any problems at this time and takes care of activities of daily living.  No chest pain orthopnea or PND.  He walks half an hour on a regular basis.  At the time of my evaluation, the patient is alert awake oriented and in no distress.  The patient does not have symptoms concerning for COVID-19 infection (fever, chills, cough, or new shortness of breath).    Past Medical History:  Diagnosis Date  . Arthritis    psoriatic  . Cancer ([place])    thyroid  . GERD (gastroesophageal reflux disease)   . Hyperlipidemia   . Hypertension   . 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 Mitral valve prolapse    Past Surgical History:  Procedure Laterality Date  . HERNIA REPAIR Bilateral   . INGUINAL HERNIA REPAIR Left 01/28/2019   Procedure: LEFT INGUINAL HERNIA REPAIR WITH MESH;   Surgeon: Rolm Bookbinder, MD;  Location: [facility];  Service: General;  Laterality: Left;  GENERAL ANESTHESIA AND TAP BLOCK  . KNEE ARTHROSCOPY    . THYROIDECTOMY, PARTIAL    . VARICOSE VEIN SURGERY       Current Meds  Medication Sig  . amLODipine (NORVASC) 2.5 MG tablet Take 2.5 mg by mouth daily.  Marland Kitchen aspirin-acetaminophen-caffeine (EXCEDRIN MIGRAINE) 250-250-65 MG tablet Take 1 tablet by mouth every 6 (six) hours as needed for headache.  . b complex vitamins tablet Take 1 tablet by mouth daily.  . Biotin 5 MG CAPS Take 5 mg by mouth daily.  . Calcium Citrate-Vitamin D (CALCIUM + D PO) Take 1 tablet by mouth daily.  . fexofenadine (ALLEGRA) 180 MG tablet Take 180 mg by mouth daily as needed for allergies or rhinitis.  Marland Kitchen ketoconazole (NIZORAL) 2 % shampoo ROTATE WITH OTC SHAMPOOS  . levothyroxine (SYNTHROID, LEVOTHROID) 100 MCG tablet Take 100 mcg by mouth daily before breakfast.  . methocarbamol (ROBAXIN) 500 MG tablet Take 500 mg by mouth every 12 (twelve) hours as needed for muscle pain. for pain  . mupirocin ointment (BACTROBAN) 2 % Apply topically 2 (two) times daily as needed.  . naproxen sodium (ALEVE) 220 MG tablet Take 220 mg by mouth daily as needed (pain).  Marland Kitchen olmesartan (BENICAR) 40 MG tablet Take 40 mg by mouth daily.  Marland Kitchen omeprazole (PRILOSEC) 40 MG capsule Take 40 mg by mouth daily.  . pseudoephedrine (  SUDAFED) 30 MG tablet Take 30 mg by mouth daily as needed for congestion.  . rosuvastatin (CRESTOR) 10 MG tablet Take 10 mg by mouth daily.  . vitamin B-12 (CYANOCOBALAMIN) 1000 MCG tablet Take 1,000 mcg by mouth 2 (two) times a week.  . vitamin E 400 UNIT capsule Take 400 Units by mouth daily.     Allergies:   Patient has no known allergies.   Social History   Tobacco Use  . Smoking status: Never Smoker  . Smokeless tobacco: Never Used  Substance Use Topics  . Alcohol use: Yes    Alcohol/week: 7.0 standard drinks    Types: 7 Cans of beer per week    Frequency: Never  .  Drug use: Never     Family Hx: The patient's family history is not on file.  ROS:   Please see the history of present illness.    As mentioned above All other systems reviewed and are negative.   Prior CV studies:   The following studies were reviewed today:  Results of stress test were discussed with patient  Labs/Other Tests and Data Reviewed:    EKG:  No ECG reviewed.  Recent Labs: 01/28/2019: BUN 10; Creatinine, Ser 0.81; Hemoglobin 13.8; Platelets 181; Potassium 3.9; Sodium 133   Recent Lipid Panel No results found for: CHOL, TRIG, HDL, CHOLHDL, LDLCALC, LDLDIRECT  Wt Readings from Last 3 Encounters:  05/19/19 167 lb (75.8 kg)  01/28/19 165 lb (74.8 kg)  01/02/19 168 lb (76.2 kg)     Objective:    Vital Signs:  BP (!) 141/84 (BP Location: Left Arm, Patient Position: Standing, Cuff Size: Normal)   Pulse 79   Ht 5\' 9"  (1.753 m)   Wt 167 lb (75.8 kg)   BMI 24.66 kg/m    VITAL SIGNS:  reviewed  ASSESSMENT & PLAN:    1. Essential hypertension: Blood pressure stable.  Patient will continue current medications. 2. Mixed dyslipidemia: Diet was discussed and I recommended that he has blood work as he has not had it done in the past several months.  Patient mentions to me that he will let me know what he wants to get it done at his primary care's office or to come to get blood work done with [place].  We would need a Chem-7 CBC TSH and liver lipid check if he wishes to come to our office. 3. His exercise tolerance is excellent. Patient will be seen in follow-up appointment in 6 months or earlier if the patient has any concerns   COVID-19 Education: The signs and symptoms of COVID-19 were discussed with the patient and how to seek care for testing (follow up with PCP or arrange E-visit).  The importance of social distancing was discussed today.  Time:   Today, I have spent 15 minutes with the patient with telehealth technology discussing the above problems.      Medication Adjustments/Labs and Tests Ordered: Current medicines are reviewed at length with the patient today.  Concerns regarding medicines are outlined above.   Tests Ordered: No orders of the defined types were placed in this encounter.   Medication Changes: No orders of the defined types were placed in this encounter.   Disposition:  Follow up in 6 month(s)  Signed, Jenean Lindau, MD  05/19/2019 9:56 AM    [place]

## 2019-05-20 DIAGNOSIS — R9431 Abnormal electrocardiogram [ECG] [EKG]: Secondary | ICD-10-CM | POA: Diagnosis not present

## 2019-05-20 DIAGNOSIS — I10 Essential (primary) hypertension: Secondary | ICD-10-CM | POA: Diagnosis not present

## 2019-05-20 DIAGNOSIS — Z1329 Encounter for screening for other suspected endocrine disorder: Secondary | ICD-10-CM | POA: Diagnosis not present

## 2019-05-20 DIAGNOSIS — E782 Mixed hyperlipidemia: Secondary | ICD-10-CM | POA: Diagnosis not present

## 2019-05-21 LAB — BASIC METABOLIC PANEL
BUN/Creatinine Ratio: 12 (ref 10–24)
BUN: 12 mg/dL (ref 8–27)
CO2: 23 mmol/L (ref 20–29)
Calcium: 9.4 mg/dL (ref 8.6–10.2)
Chloride: 95 mmol/L — ABNORMAL LOW (ref 96–106)
Creatinine, Ser: 0.99 mg/dL (ref 0.76–1.27)
GFR calc Af Amer: 86 mL/min/{1.73_m2} (ref >59)
GFR calc non Af Amer: 75 mL/min/{1.73_m2} (ref >59)
Glucose: 99 mg/dL (ref 65–99)
Potassium: 4.6 mmol/L (ref 3.5–5.2)
Sodium: 130 mmol/L — ABNORMAL LOW (ref 134–144)

## 2019-05-21 LAB — HEPATIC FUNCTION PANEL
ALT: 22 IU/L (ref 0–44)
AST: 27 IU/L (ref 0–40)
Albumin: 4.7 g/dL (ref 3.7–4.7)
Alkaline Phosphatase: 68 IU/L (ref 39–117)
Bilirubin Total: 0.6 mg/dL (ref 0.0–1.2)
Bilirubin, Direct: 0.17 mg/dL (ref 0.00–0.40)
Total Protein: 6.6 g/dL (ref 6.0–8.5)

## 2019-05-21 LAB — LIPID PANEL
Chol/HDL Ratio: 3.2 ratio (ref 0.0–5.0)
Cholesterol, Total: 145 mg/dL (ref 100–199)
HDL: 46 mg/dL (ref >39)
LDL Calculated: 80 mg/dL (ref 0–99)
Triglycerides: 96 mg/dL (ref 0–149)
VLDL Cholesterol Cal: 19 mg/dL (ref 5–40)

## 2019-05-21 LAB — CBC
Hematocrit: 43.4 % (ref 37.5–51.0)
Hemoglobin: 14.5 g/dL (ref 13.0–17.7)
MCH: 30.9 pg (ref 26.6–33.0)
MCHC: 33.4 g/dL (ref 31.5–35.7)
MCV: 93 fL (ref 79–97)
Platelets: 211 10*3/uL (ref 150–450)
RBC: 4.69 x10E6/uL (ref 4.14–5.80)
RDW: 12.4 % (ref 11.6–15.4)
WBC: 5 10*3/uL (ref 3.4–10.8)

## 2019-05-21 LAB — TSH: TSH: 0.422 u[IU]/mL — ABNORMAL LOW (ref 0.450–4.500)

## 2019-05-26 ENCOUNTER — Telehealth: Payer: Self-pay

## 2019-05-26 NOTE — Telephone Encounter (Signed)
Results relayed to patient and he states his sodium is consistently low. Rn advised he start adding a little salt in with his meals. He is scheduled for annual in Aug with Dr. Lorin Mercy and will have labs redrawn then. Copy of results sent to Dr.Yates per Dr. Docia Furl.

## 2019-05-26 NOTE — Telephone Encounter (Signed)
-----   Message from Austin Miles, RN sent at 05/22/2019  8:37 AM EDT -----  ----- Message ----- From: Jenean Lindau, MD Sent: 05/21/2019   8:33 AM EDT To: Mattie Marlin, RN  Lipids are fine.  Sodium is on the lower side.  Have him increase a little bit of salt in his diet and keep a track of his pulse blood pressure and come back for a Chem-7 in a week.  His thyroid function is abnormal and he needs to talk to his primary care about this.  Please send lab work to primary care and tell him to touch base with them ASAP. Jenean Lindau, MD 05/21/2019 8:32 AM

## 2019-06-04 NOTE — Telephone Encounter (Signed)
Pt states that lab work never got sent to Dr. Lorin Mercy. Could you send again?

## 2019-06-04 NOTE — Telephone Encounter (Signed)
Second copy of lab results sent to Dr. Lorin Mercy via [facility] fax.

## 2019-07-30 DIAGNOSIS — I10 Essential (primary) hypertension: Secondary | ICD-10-CM | POA: Diagnosis not present

## 2019-07-30 DIAGNOSIS — M05749 Rheumatoid arthritis with rheumatoid factor of unspecified hand without organ or systems involvement: Secondary | ICD-10-CM | POA: Diagnosis not present

## 2019-07-30 DIAGNOSIS — Z125 Encounter for screening for malignant neoplasm of prostate: Secondary | ICD-10-CM | POA: Diagnosis not present

## 2019-07-30 DIAGNOSIS — E039 Hypothyroidism, unspecified: Secondary | ICD-10-CM | POA: Diagnosis not present

## 2019-07-30 DIAGNOSIS — K219 Gastro-esophageal reflux disease without esophagitis: Secondary | ICD-10-CM | POA: Diagnosis not present

## 2019-07-30 DIAGNOSIS — E782 Mixed hyperlipidemia: Secondary | ICD-10-CM | POA: Diagnosis not present

## 2019-08-31 DIAGNOSIS — Z23 Encounter for immunization: Secondary | ICD-10-CM | POA: Diagnosis not present

## 2019-08-31 DIAGNOSIS — E039 Hypothyroidism, unspecified: Secondary | ICD-10-CM | POA: Diagnosis not present

## 2019-09-15 DIAGNOSIS — L4050 Arthropathic psoriasis, unspecified: Secondary | ICD-10-CM | POA: Diagnosis not present

## 2019-09-15 DIAGNOSIS — L400 Psoriasis vulgaris: Secondary | ICD-10-CM | POA: Diagnosis not present

## 2019-10-07 DIAGNOSIS — E039 Hypothyroidism, unspecified: Secondary | ICD-10-CM | POA: Diagnosis not present

## 2019-11-09 DIAGNOSIS — E039 Hypothyroidism, unspecified: Secondary | ICD-10-CM | POA: Diagnosis not present

## 2019-11-18 ENCOUNTER — Ambulatory Visit (INDEPENDENT_AMBULATORY_CARE_PROVIDER_SITE_OTHER): Payer: Medicare Other | Admitting: Cardiology

## 2019-11-18 ENCOUNTER — Encounter: Payer: Self-pay | Admitting: Cardiology

## 2019-11-18 ENCOUNTER — Other Ambulatory Visit: Payer: Self-pay

## 2019-11-18 VITALS — BP 162/92 | HR 77 | Ht 69.0 in | Wt 158.8 lb

## 2019-11-18 DIAGNOSIS — E782 Mixed hyperlipidemia: Secondary | ICD-10-CM

## 2019-11-18 DIAGNOSIS — I340 Nonrheumatic mitral (valve) insufficiency: Secondary | ICD-10-CM | POA: Diagnosis not present

## 2019-11-18 DIAGNOSIS — I341 Nonrheumatic mitral (valve) prolapse: Secondary | ICD-10-CM | POA: Insufficient documentation

## 2019-11-18 DIAGNOSIS — I10 Essential (primary) hypertension: Secondary | ICD-10-CM | POA: Diagnosis not present

## 2019-11-18 HISTORY — DX: Nonrheumatic mitral (valve) insufficiency: I34.0

## 2019-11-18 NOTE — Progress Notes (Signed)
Tell me Cardiology Office Note:    Date:  11/18/2019   ID:  KEEFER SCHUTT, DOB 01-30-1946, MRN [id number]  PCP:  Renaldo Reel, PA  Cardiologist:  Jenean Lindau, MD   Referring MD: Renaldo Reel, PA    ASSESSMENT:    1. Essential hypertension   2. Mixed dyslipidemia   3. Mitral valve prolapse   4. Mitral valve insufficiency, unspecified etiology    PLAN:    In order of problems listed above:  1. Mitral valve prolapse with moderate to severe mitral regurgitation.  I discussed my findings with the patient extensively.  His ejection fraction is preserved.  His effort tolerance is excellent.  I wanted to refer him to the valve clinic but is not keen on this.  This is because of the fact that he is effort tolerance is excellent and because of the pandemic he does not want to be exposed to too many people or physicians or clinics.  I respect his wishes.  We will continue to monitor him and do a follow-up echocardiogram in the next visit. 2. Essential hypertension: Blood pressure stable 3. Mixed dyslipidemia: Lipids followed by primary care physician 4. Patient will be seen in follow-up appointment in 6 months or earlier if the patient has any concerns    Medication Adjustments/Labs and Tests Ordered: Current medicines are reviewed at length with the patient today.  Concerns regarding medicines are outlined above.  No orders of the defined types were placed in this encounter.  No orders of the defined types were placed in this encounter.    Chief Complaint  Patient presents with  . Follow-up     History of Present Illness:    Cristian Nunez is a 73 y.o. male.  Patient has past medical history of essential hypertension and dyslipidemia.  He is a Software engineer by profession.  He denies any problems at this time and takes care of activities of daily living.  No chest pain orthopnea or PND.  At the time of my evaluation, the patient is alert awake oriented and in no distress.  He  exercises on a regular basis about 30 to 40 minutes of walking.  Past Medical History:  Diagnosis Date  . Arthritis    psoriatic  . Cancer ([place])    thyroid  . GERD (gastroesophageal reflux disease)   . Hyperlipidemia   . Hypertension   . 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 Mitral valve prolapse     Past Surgical History:  Procedure Laterality Date  . HERNIA REPAIR Bilateral   . INGUINAL HERNIA REPAIR Left 01/28/2019   Procedure: LEFT INGUINAL HERNIA REPAIR WITH MESH;  Surgeon: Rolm Bookbinder, MD;  Location: [place];  Service: General;  Laterality: Left;  GENERAL ANESTHESIA AND TAP BLOCK  . KNEE ARTHROSCOPY    . THYROIDECTOMY, PARTIAL    . VARICOSE VEIN SURGERY      Current Medications: Current Meds  Medication Sig  . amLODipine (NORVASC) 2.5 MG tablet Take 2.5 mg by mouth daily.  Marland Kitchen aspirin-acetaminophen-caffeine (EXCEDRIN MIGRAINE) 250-250-65 MG tablet Take 1 tablet by mouth every 6 (six) hours as needed for headache.  . b complex vitamins tablet Take 1 tablet by mouth daily.  . Biotin 5 MG CAPS Take 5 mg by mouth daily.  . Calcium Citrate-Vitamin D (CALCIUM + D PO) Take 1 tablet by mouth daily.  . clindamycin (CLEOCIN T) 1 % lotion   . clobetasol ointment (TEMOVATE) 0.05 %   .  EUTHYROX 75 MCG tablet   . Ixekizumab (TALTZ) 80 MG/ML SOSY Inject 80 mg into the skin every 30 (thirty) days.   Marland Kitchen ketoconazole (NIZORAL) 2 % shampoo ROTATE WITH OTC SHAMPOOS  . levothyroxine (SYNTHROID) 50 MCG tablet   . methocarbamol (ROBAXIN) 500 MG tablet Take 500 mg by mouth every 12 (twelve) hours as needed for muscle pain. for pain  . mupirocin ointment (BACTROBAN) 2 % Apply topically 2 (two) times daily as needed.  Marland Kitchen olmesartan (BENICAR) 40 MG tablet Take 40 mg by mouth daily.  Marland Kitchen omeprazole (PRILOSEC) 40 MG capsule Take 40 mg by mouth daily.  . pseudoephedrine (SUDAFED) 30 MG tablet Take 30 mg by mouth daily as needed for congestion.  . rosuvastatin (CRESTOR) 10 MG tablet Take 10  mg by mouth daily.  . vitamin B-12 (CYANOCOBALAMIN) 1000 MCG tablet Take 1,000 mcg by mouth 2 (two) times a week.     Allergies:   Patient has no known allergies.   Social History   Socioeconomic History  . Marital status: Married    Spouse name: Not on file  . Number of children: Not on file  . Years of education: Not on file  . Highest education level: Not on file  Occupational History  . Not on file  Social Needs  . Financial resource strain: Not on file  . Food insecurity    Worry: Not on file    Inability: Not on file  . Transportation needs    Medical: Not on file    Non-medical: Not on file  Tobacco Use  . Smoking status: Never Smoker  . Smokeless tobacco: Never Used  Substance and Sexual Activity  . Alcohol use: Yes    Alcohol/week: 7.0 standard drinks    Types: 7 Cans of beer per week    Frequency: Never  . Drug use: Never  . Sexual activity: Not on file  Lifestyle  . Physical activity    Days per week: Not on file    Minutes per session: Not on file  . Stress: Not on file  Relationships  . Social Herbalist on phone: Not on file    Gets together: Not on file    Attends religious service: Not on file    Active member of club or organization: Not on file    Attends meetings of clubs or organizations: Not on file    Relationship status: Not on file  Other Topics Concern  . Not on file  Social History Narrative  . Not on file     Family History: The patient's family history is not on file.  ROS:   Please see the history of present illness.    All other systems reviewed and are negative.  EKGs/Labs/Other Studies Reviewed:    The following studies were reviewed today: Reports of echo and stress echo were discussed with the patient at length. Study Conclusions  - Left ventricle: The cavity size was normal. Wall thickness was   increased in a pattern of mild LVH. Systolic function was normal.   The estimated ejection fraction was in the  range of 60% to 65%.   Wall motion was normal; there were no regional wall motion   abnormalities. Doppler parameters are consistent with abnormal   left ventricular relaxation (grade 1 diastolic dysfunction). - Mitral valve: Prolapse. Moderate prolapse. There was moderate to   severe regurgitation. - Left atrium: The atrium was moderately dilated.    Recent Labs: 05/20/2019: ALT  22; BUN 12; Creatinine, Ser 0.99; Hemoglobin 14.5; Platelets 211; Potassium 4.6; Sodium 130; TSH 0.422  Recent Lipid Panel    Component Value Date/Time   CHOL 145 05/20/2019 1017   TRIG 96 05/20/2019 1017   HDL 46 05/20/2019 1017   CHOLHDL 3.2 05/20/2019 1017   LDLCALC 80 05/20/2019 1017    Physical Exam:    VS:  BP (!) 162/92   Pulse 77   Ht 5\' 9"  (1.753 m)   Wt 158 lb 12.8 oz (72 kg)   SpO2 97%   BMI 23.45 kg/m     Wt Readings from Last 3 Encounters:  11/18/19 158 lb 12.8 oz (72 kg)  05/19/19 167 lb (75.8 kg)  01/28/19 165 lb (74.8 kg)     GEN: Patient is in no acute distress HEENT: Normal NECK: No JVD; No carotid bruits LYMPHATICS: No lymphadenopathy CARDIAC: Hear sounds regular, 2/6 systolic murmur at the apex. RESPIRATORY:  Clear to auscultation without rales, wheezing or rhonchi  ABDOMEN: Soft, non-tender, non-distended MUSCULOSKELETAL:  No edema; No deformity  SKIN: Warm and dry NEUROLOGIC:  Alert and oriented x 3 PSYCHIATRIC:  Normal affect   Signed, Jenean Lindau, MD  11/18/2019 10:40 AM    [place]

## 2019-11-18 NOTE — Patient Instructions (Signed)
Medication Instructions:  Your physician recommends that you continue on your current medications as directed. Please refer to the Current Medication list given to you today.  *If you need a refill on your cardiac medications before your next appointment, please call your pharmacy*  Lab Work: NONE If you have labs (blood work) drawn today and your tests are completely normal, you will receive your results only by: Marland Kitchen MyChart Message (if you have MyChart) OR . A paper copy in the mail If you have any lab test that is abnormal or we need to change your treatment, we will call you to review the results.  Testing/Procedures: You had an EKG performed  Follow-Up: At [facility], you and your health needs are our priority.  As part of our continuing mission to provide you with exceptional heart care, we have created designated Provider Care Teams.  These Care Teams include your primary Cardiologist (physician) and Advanced Practice Providers (APPs -  Physician Assistants and Nurse Practitioners) who all work together to provide you with the care you need, when you need it.  Your next appointment:   6 month(s)  The format for your next appointment:   In Person  Provider:   Jyl Heinz, MD

## 2019-11-20 NOTE — Addendum Note (Signed)
Addended by: Austin Miles on: 11/20/2019 08:13 AM   Modules accepted: Orders

## 2019-11-23 DIAGNOSIS — H43313 Vitreous membranes and strands, bilateral: Secondary | ICD-10-CM | POA: Diagnosis not present

## 2019-11-23 DIAGNOSIS — H524 Presbyopia: Secondary | ICD-10-CM | POA: Diagnosis not present

## 2019-12-25 DIAGNOSIS — L400 Psoriasis vulgaris: Secondary | ICD-10-CM | POA: Diagnosis not present

## 2019-12-25 DIAGNOSIS — R531 Weakness: Secondary | ICD-10-CM | POA: Diagnosis not present

## 2020-01-17 ENCOUNTER — Ambulatory Visit: Payer: Medicare Other | Attending: Internal Medicine

## 2020-01-17 DIAGNOSIS — Z23 Encounter for immunization: Secondary | ICD-10-CM

## 2020-01-17 NOTE — Progress Notes (Signed)
   Covid-19 Vaccination Clinic  Name:  CAYNE KLESS    MRN: [id number] DOB: 1946-01-25  01/17/2020  Mr. Zerkel was observed post Covid-19 immunization for 15 minutes without incidence. He was provided with Vaccine Information Sheet and instruction to access the V-Safe system.   Mr. Bouse was instructed to call 911 with any severe reactions post vaccine: Marland Kitchen Difficulty breathing  . Swelling of your face and throat  . A fast heartbeat  . A bad rash all over your body  . Dizziness and weakness    Immunizations Administered    Name Date Dose VIS Date Route   Pfizer COVID-19 Vaccine 01/17/2020  3:47 PM 0.3 mL 11/20/2019 Intramuscular   Manufacturer: [place]   Lot: [id number]   [place]: [id number]

## 2020-02-08 ENCOUNTER — Telehealth: Payer: Self-pay | Admitting: Cardiology

## 2020-02-08 NOTE — Telephone Encounter (Signed)
Patient is having dental work on March 23rd and needs a form signed by Dr. Geraldo Pitter stating he does not need to be pre-medicated prior to his appointment. The patient goes to Dr. Geanie Logan and their fax number is [phone]. Their office phone number is [phone]

## 2020-02-08 NOTE — Telephone Encounter (Signed)
Will route to nurses onsite to see if this form is there.  Thank you!

## 2020-02-10 NOTE — Telephone Encounter (Signed)
Dr. Huston Foley office states that they need a letter with recommendation of whether the patient needs to be premeditated prior to a dental procedure on 03/01/20.    What do you

## 2020-02-11 ENCOUNTER — Ambulatory Visit: Payer: Medicare Other | Attending: Internal Medicine

## 2020-02-11 DIAGNOSIS — Z23 Encounter for immunization: Secondary | ICD-10-CM | POA: Insufficient documentation

## 2020-02-11 NOTE — Progress Notes (Signed)
   Covid-19 Vaccination Clinic  Name:  Cristian Nunez    MRN: [id number] DOB: 1946-09-02  02/11/2020  Cristian Nunez was observed post Covid-19 immunization for 15 minutes without incident. He was provided with Vaccine Information Sheet and instruction to access the V-Safe system.   Cristian Nunez was instructed to call 911 with any severe reactions post vaccine: Marland Kitchen Difficulty breathing  . Swelling of face and throat  . A fast heartbeat  . A bad rash all over body  . Dizziness and weakness   Immunizations Administered    Name Date Dose VIS Date Route   Pfizer COVID-19 Vaccine 02/11/2020 11:57 AM 0.3 mL 11/20/2019 Intramuscular   Manufacturer: [place]   Lot: [id number]   [place]: [id number]

## 2020-02-12 NOTE — Telephone Encounter (Signed)
According to current guidelines there is no indication for antibiotic prophylaxis therapy.

## 2020-02-15 DIAGNOSIS — K219 Gastro-esophageal reflux disease without esophagitis: Secondary | ICD-10-CM | POA: Diagnosis not present

## 2020-02-15 DIAGNOSIS — E039 Hypothyroidism, unspecified: Secondary | ICD-10-CM | POA: Diagnosis not present

## 2020-02-15 DIAGNOSIS — E782 Mixed hyperlipidemia: Secondary | ICD-10-CM | POA: Diagnosis not present

## 2020-02-15 DIAGNOSIS — I10 Essential (primary) hypertension: Secondary | ICD-10-CM | POA: Diagnosis not present

## 2020-03-07 DIAGNOSIS — E782 Mixed hyperlipidemia: Secondary | ICD-10-CM | POA: Diagnosis not present

## 2020-03-07 DIAGNOSIS — I10 Essential (primary) hypertension: Secondary | ICD-10-CM | POA: Diagnosis not present

## 2020-03-07 DIAGNOSIS — K219 Gastro-esophageal reflux disease without esophagitis: Secondary | ICD-10-CM | POA: Diagnosis not present

## 2020-03-07 DIAGNOSIS — M05749 Rheumatoid arthritis with rheumatoid factor of unspecified hand without organ or systems involvement: Secondary | ICD-10-CM | POA: Diagnosis not present

## 2020-03-07 DIAGNOSIS — E039 Hypothyroidism, unspecified: Secondary | ICD-10-CM | POA: Diagnosis not present

## 2020-03-21 DIAGNOSIS — L400 Psoriasis vulgaris: Secondary | ICD-10-CM | POA: Diagnosis not present

## 2020-03-21 DIAGNOSIS — L4050 Arthropathic psoriasis, unspecified: Secondary | ICD-10-CM | POA: Diagnosis not present

## 2020-04-07 DIAGNOSIS — E875 Hyperkalemia: Secondary | ICD-10-CM | POA: Diagnosis not present

## 2020-05-04 DIAGNOSIS — Z0000 Encounter for general adult medical examination without abnormal findings: Secondary | ICD-10-CM | POA: Diagnosis not present

## 2020-05-04 DIAGNOSIS — Z9181 History of falling: Secondary | ICD-10-CM | POA: Diagnosis not present

## 2020-05-04 DIAGNOSIS — E785 Hyperlipidemia, unspecified: Secondary | ICD-10-CM | POA: Diagnosis not present

## 2020-05-04 DIAGNOSIS — Z139 Encounter for screening, unspecified: Secondary | ICD-10-CM | POA: Diagnosis not present

## 2020-05-04 DIAGNOSIS — Z1331 Encounter for screening for depression: Secondary | ICD-10-CM | POA: Diagnosis not present

## 2020-05-04 DIAGNOSIS — Z125 Encounter for screening for malignant neoplasm of prostate: Secondary | ICD-10-CM | POA: Diagnosis not present

## 2020-05-18 ENCOUNTER — Encounter: Payer: Self-pay | Admitting: Cardiology

## 2020-05-18 ENCOUNTER — Other Ambulatory Visit: Payer: Self-pay

## 2020-05-18 ENCOUNTER — Ambulatory Visit (INDEPENDENT_AMBULATORY_CARE_PROVIDER_SITE_OTHER): Payer: Medicare Other | Admitting: Cardiology

## 2020-05-18 VITALS — BP 140/80 | HR 55 | Ht 69.0 in | Wt 153.0 lb

## 2020-05-18 DIAGNOSIS — E782 Mixed hyperlipidemia: Secondary | ICD-10-CM

## 2020-05-18 DIAGNOSIS — I10 Essential (primary) hypertension: Secondary | ICD-10-CM

## 2020-05-18 DIAGNOSIS — I341 Nonrheumatic mitral (valve) prolapse: Secondary | ICD-10-CM

## 2020-05-18 DIAGNOSIS — I340 Nonrheumatic mitral (valve) insufficiency: Secondary | ICD-10-CM | POA: Diagnosis not present

## 2020-05-18 NOTE — Addendum Note (Signed)
Addended by: Truddie Hidden on: 05/18/2020 03:32 PM   Modules accepted: Orders

## 2020-05-18 NOTE — Progress Notes (Signed)
Cardiology Office Note:    Date:  05/18/2020   ID:  Cristian Nunez, DOB 11/13/1946, MRN [id number]  PCP:  Renaldo Reel, PA  Cardiologist:  Jenean Lindau, MD   Referring MD: Renaldo Reel, PA    ASSESSMENT:    1. Essential hypertension   2. Mitral valve insufficiency, unspecified etiology   3. Mitral valve prolapse   4. Mixed dyslipidemia    PLAN:    In order of problems listed above:  1. Mitral regurgitation with mitral valve prolapse and has had moderate mitral valve prolapse with moderate to severe mitral regurgitation.  The left atrium is moderately enlarged.  This was an echo done in early 2020.  We will repeat this.  He is now agreeable to having an appointment at the valve clinic with our valve clinic colleagues. 2. Essential hypertension: Blood pressure is stable.  His exercise tolerance is excellent. 3. Mixed dyslipidemia: Diet was emphasized.  He is very compliant with instructions.  I reviewed his lab work. 4. Patient will be seen in follow-up appointment in 6 months or earlier if the patient has any concerns    Medication Adjustments/Labs and Tests Ordered: Current medicines are reviewed at length with the patient today.  Concerns regarding medicines are outlined above.  No orders of the defined types were placed in this encounter.  No orders of the defined types were placed in this encounter.    No chief complaint on file.    History of Present Illness:    Cristian Nunez is a 74 y.o. male.  Patient has history of essential hypertension, mitral valve prolapse and significant regurgitation.  He also has history of mixed dyslipidemia.  He denies any problems at this time and takes care of activities of daily living.  No chest pain orthopnea or PND.  At the time of my evaluation, the patient is alert awake oriented and in no distress.  He denies any significant shortness of breath on exertion.  Past Medical History:  Diagnosis Date  . Arthritis    psoriatic    . Cancer ([place])    thyroid  . GERD (gastroesophageal reflux disease)   . Hyperlipidemia   . Hypertension   . 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 Mitral valve prolapse     Past Surgical History:  Procedure Laterality Date  . HERNIA REPAIR Bilateral   . INGUINAL HERNIA REPAIR Left 01/28/2019   Procedure: LEFT INGUINAL HERNIA REPAIR WITH MESH;  Surgeon: Rolm Bookbinder, MD;  Location: [place];  Service: General;  Laterality: Left;  GENERAL ANESTHESIA AND TAP BLOCK  . KNEE ARTHROSCOPY    . THYROIDECTOMY, PARTIAL    . VARICOSE VEIN SURGERY      Current Medications: Current Meds  Medication Sig  . amLODipine (NORVASC) 2.5 MG tablet Take 2.5 mg by mouth daily.  Marland Kitchen amoxicillin (AMOXIL) 500 MG capsule   . aspirin-acetaminophen-caffeine (EXCEDRIN MIGRAINE) 250-250-65 MG tablet Take 1 tablet by mouth every 6 (six) hours as needed for headache.  . b complex vitamins tablet Take 1 tablet by mouth daily.  . Biotin 5 MG CAPS Take 5 mg by mouth daily.  . Calcium Citrate-Vitamin D (CALCIUM + D PO) Take 1 tablet by mouth daily.  . clindamycin (CLEOCIN T) 1 % lotion   . clobetasol ointment (TEMOVATE) 0.05 %   . EUTHYROX 75 MCG tablet   . hydrochlorothiazide (HYDRODIURIL) 12.5 MG tablet Take 12.5 mg by mouth daily.  . Ixekizumab (TALTZ) 80  MG/ML SOSY Inject 80 mg into the skin every 30 (thirty) days.   Marland Kitchen ketoconazole (NIZORAL) 2 % shampoo ROTATE WITH OTC SHAMPOOS  . levothyroxine (SYNTHROID) 50 MCG tablet   . methocarbamol (ROBAXIN) 500 MG tablet Take 500 mg by mouth every 12 (twelve) hours as needed for muscle pain. for pain  . mupirocin ointment (BACTROBAN) 2 % Apply topically 2 (two) times daily as needed.  Marland Kitchen olmesartan (BENICAR) 40 MG tablet Take 40 mg by mouth daily.  Marland Kitchen omeprazole (PRILOSEC) 40 MG capsule Take 40 mg by mouth daily.  . pseudoephedrine (SUDAFED) 30 MG tablet Take 30 mg by mouth daily as needed for congestion.  . rosuvastatin (CRESTOR) 10 MG tablet Take 10 mg by mouth  daily.  . vitamin B-12 (CYANOCOBALAMIN) 1000 MCG tablet Take 1,000 mcg by mouth 2 (two) times a week.     Allergies:   Patient has no known allergies.   Social History   Socioeconomic History  . Marital status: Married    Spouse name: Not on file  . Number of children: Not on file  . Years of education: Not on file  . Highest education level: Not on file  Occupational History  . Not on file  Tobacco Use  . Smoking status: Never Smoker  . Smokeless tobacco: Never Used  Substance and Sexual Activity  . Alcohol use: Yes    Alcohol/week: 7.0 standard drinks    Types: 7 Cans of beer per week  . Drug use: Never  . Sexual activity: Not on file  Other Topics Concern  . Not on file  Social History Narrative  . Not on file   Social Determinants of Health   Financial Resource Strain:   . Difficulty of Paying Living Expenses:   Food Insecurity:   . Worried About Charity fundraiser in the Last Year:   . Arboriculturist in the Last Year:   Transportation Needs:   . Film/video editor (Medical):   Marland Kitchen Lack of Transportation (Non-Medical):   Physical Activity:   . Days of Exercise per Week:   . Minutes of Exercise per Session:   Stress:   . Feeling of Stress :   Social Connections:   . Frequency of Communication with Friends and Family:   . Frequency of Social Gatherings with Friends and Family:   . Attends Religious Services:   . Active Member of Clubs or Organizations:   . Attends Archivist Meetings:   Marland Kitchen Marital Status:      Family History: The patient's family history is not on file.  ROS:   Please see the history of present illness.    All other systems reviewed and are negative.  EKGs/Labs/Other Studies Reviewed:    The following studies were reviewed today: Echocardiogram report was discussed with the patient.   Recent Labs: 05/20/2019: ALT 22; BUN 12; Creatinine, Ser 0.99; Hemoglobin 14.5; Platelets 211; Potassium 4.6; Sodium 130; TSH 0.422    Recent Lipid Panel    Component Value Date/Time   CHOL 145 05/20/2019 1017   TRIG 96 05/20/2019 1017   HDL 46 05/20/2019 1017   CHOLHDL 3.2 05/20/2019 1017   LDLCALC 80 05/20/2019 1017    Physical Exam:    VS:  BP 140/80   Pulse (!) 55   Ht 5\' 9"  (1.753 m)   Wt 153 lb (69.4 kg)   SpO2 97%   BMI 22.59 kg/m     Wt Readings from Last 3 Encounters:  05/18/20 153 lb (69.4 kg)  11/18/19 158 lb 12.8 oz (72 kg)  05/19/19 167 lb (75.8 kg)     GEN: Patient is in no acute distress HEENT: Normal NECK: No JVD; No carotid bruits LYMPHATICS: No lymphadenopathy CARDIAC: Hear sounds regular, 2/6 systolic murmur at the apex. RESPIRATORY:  Clear to auscultation without rales, wheezing or rhonchi  ABDOMEN: Soft, non-tender, non-distended MUSCULOSKELETAL:  No edema; No deformity  SKIN: Warm and dry NEUROLOGIC:  Alert and oriented x 3 PSYCHIATRIC:  Normal affect   Signed, Jenean Lindau, MD  05/18/2020 3:14 PM    [facility]

## 2020-05-18 NOTE — Patient Instructions (Signed)
Medication Instructions:  No medication changes. *If you need a refill on your cardiac medications before your next appointment, please call your pharmacy*   Lab Work: None ordered If you have labs (blood work) drawn today and your tests are completely normal, you will receive your results only by: . MyChart Message (if you have MyChart) OR . A paper copy in the mail If you have any lab test that is abnormal or we need to change your treatment, we will call you to review the results.   Testing/Procedures: Your physician has requested that you have an echocardiogram. Echocardiography is a painless test that uses sound waves to create images of your heart. It provides your doctor with information about the size and shape of your heart and how well your heart's chambers and valves are working. This procedure takes approximately one hour. There are no restrictions for this procedure.     Follow-Up: At [facility], you and your health needs are our priority.  As part of our continuing mission to provide you with exceptional heart care, we have created designated Provider Care Teams.  These Care Teams include your primary Cardiologist (physician) and Advanced Practice Providers (APPs -  Physician Assistants and Nurse Practitioners) who all work together to provide you with the care you need, when you need it.  We recommend signing up for the patient portal called "MyChart".  Sign up information is provided on this After Visit Summary.  MyChart is used to connect with patients for Virtual Visits (Telemedicine).  Patients are able to view lab/test results, encounter notes, upcoming appointments, etc.  Non-urgent messages can be sent to your provider as well.   To learn more about what you can do with MyChart, go to [email or web address].    Your next appointment:   6 month(s)  The format for your next appointment:   In Person  Provider:   Rajan Revankar, MD   Other  Instructions  Echocardiogram An echocardiogram is a procedure that uses painless sound waves (ultrasound) to produce an image of the heart. Images from an echocardiogram can provide important information about:  Signs of coronary artery disease (CAD).  Aneurysm detection. An aneurysm is a weak or damaged part of an artery wall that bulges out from the normal force of blood pumping through the body.  Heart size and shape. Changes in the size or shape of the heart can be associated with certain conditions, including heart failure, aneurysm, and CAD.  Heart muscle function.  Heart valve function.  Signs of a past heart attack.  Fluid buildup around the heart.  Thickening of the heart muscle.  A tumor or infectious growth around the heart valves. Tell a health care provider about:  Any allergies you have.  All medicines you are taking, including vitamins, herbs, eye drops, creams, and over-the-counter medicines.  Any blood disorders you have.  Any surgeries you have had.  Any medical conditions you have.  Whether you are pregnant or may be pregnant. What are the risks? Generally, this is a safe procedure. However, problems may occur, including:  Allergic reaction to dye (contrast) that may be used during the procedure. What happens before the procedure? No specific preparation is needed. You may eat and drink normally. What happens during the procedure?   An IV tube may be inserted into one of your veins.  You may receive contrast through this tube. A contrast is an injection that improves the quality of the pictures from your heart.  A   gel will be applied to your chest.  A wand-like tool (transducer) will be moved over your chest. The gel will help to transmit the sound waves from the transducer.  The sound waves will harmlessly bounce off of your heart to allow the heart images to be captured in real-time motion. The images will be recorded on a computer. The  procedure may vary among health care providers and hospitals. What happens after the procedure?  You may return to your normal, everyday life, including diet, activities, and medicines, unless your health care provider tells you not to do that. Summary  An echocardiogram is a procedure that uses painless sound waves (ultrasound) to produce an image of the heart.  Images from an echocardiogram can provide important information about the size and shape of your heart, heart muscle function, heart valve function, and fluid buildup around your heart.  You do not need to do anything to prepare before this procedure. You may eat and drink normally.  After the echocardiogram is completed, you may return to your normal, everyday life, unless your health care provider tells you not to do that. This information is not intended to replace advice given to you by your health care provider. Make sure you discuss any questions you have with your health care provider. Document Revised: 03/19/2019 Document Reviewed: 12/29/2016 Elsevier Patient Education  2020 [employer]   

## 2020-06-13 ENCOUNTER — Emergency Department (HOSPITAL_COMMUNITY)
Admission: EM | Admit: 2020-06-13 | Discharge: 2020-06-14 | Disposition: A | Discharge status: Home / Self Care | Payer: Medicare Other | Attending: Emergency Medicine | Admitting: Emergency Medicine

## 2020-06-13 ENCOUNTER — Encounter (HOSPITAL_COMMUNITY): Payer: Self-pay

## 2020-06-13 DIAGNOSIS — E039 Hypothyroidism, unspecified: Secondary | ICD-10-CM | POA: Diagnosis not present

## 2020-06-13 DIAGNOSIS — R55 Syncope and collapse: Secondary | ICD-10-CM | POA: Diagnosis not present

## 2020-06-13 DIAGNOSIS — E860 Dehydration: Secondary | ICD-10-CM | POA: Insufficient documentation

## 2020-06-13 DIAGNOSIS — Z79899 Other long term (current) drug therapy: Secondary | ICD-10-CM | POA: Insufficient documentation

## 2020-06-13 DIAGNOSIS — I10 Essential (primary) hypertension: Secondary | ICD-10-CM | POA: Insufficient documentation

## 2020-06-13 DIAGNOSIS — E871 Hypo-osmolality and hyponatremia: Secondary | ICD-10-CM | POA: Diagnosis not present

## 2020-06-13 DIAGNOSIS — R001 Bradycardia, unspecified: Secondary | ICD-10-CM | POA: Diagnosis not present

## 2020-06-13 DIAGNOSIS — Z85850 Personal history of malignant neoplasm of thyroid: Secondary | ICD-10-CM | POA: Diagnosis not present

## 2020-06-13 LAB — CBC
HCT: 37.9 % — ABNORMAL LOW (ref 39.0–52.0)
Hemoglobin: 12.8 g/dL — ABNORMAL LOW (ref 13.0–17.0)
MCH: 31.7 pg (ref 26.0–34.0)
MCHC: 33.8 g/dL (ref 30.0–36.0)
MCV: 93.8 fL (ref 80.0–100.0)
Platelets: 159 10*3/uL (ref 150–400)
RBC: 4.04 MIL/uL — ABNORMAL LOW (ref 4.22–5.81)
RDW: 12.6 % (ref 11.5–15.5)
WBC: 3.7 10*3/uL — ABNORMAL LOW (ref 4.0–10.5)
nRBC: 0 % (ref 0.0–0.2)

## 2020-06-13 LAB — BASIC METABOLIC PANEL
Anion gap: 9 (ref 5–15)
BUN: 13 mg/dL (ref 8–23)
CO2: 21 mmol/L — ABNORMAL LOW (ref 22–32)
Calcium: 8.2 mg/dL — ABNORMAL LOW (ref 8.9–10.3)
Chloride: 97 mmol/L — ABNORMAL LOW (ref 98–111)
Creatinine, Ser: 0.8 mg/dL (ref 0.61–1.24)
GFR calc Af Amer: >60 mL/min (ref >60)
GFR calc non Af Amer: >60 mL/min (ref >60)
Glucose, Bld: 136 mg/dL — ABNORMAL HIGH (ref 70–99)
Potassium: 3.2 mmol/L — ABNORMAL LOW (ref 3.5–5.1)
Sodium: 127 mmol/L — ABNORMAL LOW (ref 135–145)

## 2020-06-13 LAB — CBG MONITORING, ED: Glucose-Capillary: 144 mg/dL — ABNORMAL HIGH (ref 70–99)

## 2020-06-13 MED ORDER — SODIUM CHLORIDE 0.9 % IV BOLUS (SEPSIS)
500.0000 mL | Freq: Once | INTRAVENOUS | Status: AC
  Administered 2020-06-13: 500 mL via INTRAVENOUS

## 2020-06-13 MED ORDER — SODIUM CHLORIDE 0.9 % IV SOLN
1000.0000 mL | INTRAVENOUS | Status: DC
  Administered 2020-06-13: 1000 mL via INTRAVENOUS

## 2020-06-13 NOTE — ED Provider Notes (Signed)
[facility] EMERGENCY DEPARTMENT Provider Note   CSN: [id number] Arrival date & time: 06/13/20  2126     History Chief Complaint  Patient presents with  . Loss of Consciousness    Cristian Nunez is a 74 y.o. male.  HPI   Patient presented to the ED for evaluation of a syncopal episode.  Patient was at home.  He was eating at a cookout.  Patient started to feel hot and lightheaded.  He felt like he was going to pass out.  Patient does not think that he hydrated enough throughout the day.  Patient slumped over and had a brief loss of consciousness.  The patient's wife noted that his eye was twitching and he was leaning to one side.  Patient regained consciousness afterwards but was somewhat confused.  EMS gave him a liter of normal saline on route.  Patient felt much better after the fluids.  Patient does not have history of seizure disorder.  He is not having any trouble with his speech.  He is not have any numbness or weakness in his arms or legs.  Past Medical History:  Diagnosis Date  . Arthritis    psoriatic  . Cancer ([place])    thyroid  . GERD (gastroesophageal reflux disease)   . Hyperlipidemia   . Hypertension   . 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 Mitral valve prolapse     Patient Active Problem List   Diagnosis Date Noted  . Mitral valve prolapse 11/18/2019  . Mitral regurgitation 11/18/2019  . Essential hypertension 12/09/2018  . Mixed dyslipidemia 12/09/2018  . Abnormal EKG 12/09/2018    Past Surgical History:  Procedure Laterality Date  . HERNIA REPAIR Bilateral   . INGUINAL HERNIA REPAIR Left 01/28/2019   Procedure: LEFT INGUINAL HERNIA REPAIR WITH MESH;  Surgeon: Rolm Bookbinder, MD;  Location: [place];  Service: General;  Laterality: Left;  GENERAL ANESTHESIA AND TAP BLOCK  . KNEE ARTHROSCOPY    . THYROIDECTOMY, PARTIAL    . VARICOSE VEIN SURGERY         History reviewed. No pertinent family history.  Social History    Tobacco Use  . Smoking status: Never Smoker  . Smokeless tobacco: Never Used  Vaping Use  . Vaping Use: Never used  Substance Use Topics  . Alcohol use: Yes    Alcohol/week: 7.0 standard drinks    Types: 7 Cans of beer per week  . Drug use: Never    Home Medications Prior to Admission medications   Medication Sig Start Date End Date Taking? Authorizing Provider  amLODipine (NORVASC) 2.5 MG tablet Take 2.5 mg by mouth daily.    [provider]  amoxicillin (AMOXIL) 500 MG capsule  10/17/19   [provider]  aspirin-acetaminophen-caffeine (EXCEDRIN MIGRAINE) [phone] MG tablet Take 1 tablet by mouth every 6 (six) hours as needed for headache.    [provider]  b complex vitamins tablet Take 1 tablet by mouth daily.    [provider]  Biotin 5 MG CAPS Take 5 mg by mouth daily.    [provider]  Calcium Citrate-Vitamin D (CALCIUM + D PO) Take 1 tablet by mouth daily.    [provider]  clindamycin (CLEOCIN T) 1 % lotion  07/31/19   [provider]  clobetasol ointment (TEMOVATE) 0.05 %  09/15/19   [provider]  EUTHYROX 75 MCG tablet  11/12/19   [provider]  hydrochlorothiazide (HYDRODIURIL) 12.5  MG tablet Take 12.5 mg by mouth daily. 04/19/20   [provider]  Ixekizumab (TALTZ) 80 MG/ML SOSY Inject 80 mg into the skin every 30 (thirty) days.     [provider]  ketoconazole (NIZORAL) 2 % shampoo ROTATE WITH OTC SHAMPOOS 01/08/19   [provider]  levothyroxine (SYNTHROID) 50 MCG tablet  11/11/19   [provider]  methocarbamol (ROBAXIN) 500 MG tablet Take 500 mg by mouth every 12 (twelve) hours as needed for muscle pain. for pain 08/20/18   [provider]  mupirocin ointment (BACTROBAN) 2 % Apply topically 2 (two) times daily as needed. 01/08/19   [provider]  olmesartan (BENICAR) 40 MG tablet Take 40 mg by mouth daily.    [provider]  omeprazole (PRILOSEC) 40 MG capsule Take 40 mg by mouth daily.    [provider]  pseudoephedrine (SUDAFED) 30 MG tablet Take 30 mg by mouth daily as needed for congestion.    [provider]  rosuvastatin (CRESTOR) 10 MG tablet Take 10 mg by mouth daily.    [provider]  vitamin B-12 (CYANOCOBALAMIN) 1000 MCG tablet Take 1,000 mcg by mouth 2 (two) times a week.    [provider]    Allergies    Patient has no known allergies.  Review of Systems   Review of Systems  All other systems reviewed and are negative.   Physical Exam Updated Vital Signs BP (!) 145/76 (BP Location: Left Arm)   Pulse 66   Temp (!) 97.5 F (36.4 C) (Oral)   Resp 14   Ht 1.727 m (5\' 8" )   Wt 69.9 kg   SpO2 100%   BMI 23.42 kg/m   Physical Exam Vitals and nursing note reviewed.  Constitutional:      General: He is not in acute distress.    Appearance: He is well-developed.  HENT:     Head: Normocephalic and atraumatic.     Right Ear: External ear normal.     Left Ear: External ear normal.  Eyes:     General: No scleral icterus.       Right eye: No discharge.        Left eye: No discharge.     Conjunctiva/sclera: Conjunctivae normal.  Neck:     Trachea: No tracheal deviation.  Cardiovascular:     Rate and Rhythm: Normal rate and regular rhythm.  Pulmonary:     Effort: Pulmonary effort is normal. No respiratory distress.     Breath sounds: Normal breath sounds. No stridor. No wheezing or rales.  Abdominal:     General: Bowel sounds are normal. There is no distension.     Palpations: Abdomen is soft.     Tenderness: There is no abdominal tenderness. There is no guarding or rebound.  Musculoskeletal:        General: No tenderness.     Cervical back: Neck supple.  Skin:    General: Skin is warm and dry.     Findings: No rash.  Neurological:     Mental Status: He is alert.     Cranial Nerves: No cranial nerve deficit (no facial droop,  extraocular movements intact, no slurred speech).     Sensory: No sensory deficit.     Motor: No abnormal muscle tone or seizure activity.     Coordination: Coordination normal.     Comments: No facial droop, 5 out of 5 strength upper extremities and lower extremities.  Normal finger-to-nose  exam     ED Results / Procedures / Treatments   Labs (all labs ordered are listed, but only abnormal results are displayed) Labs Reviewed  CBC - Abnormal; Notable for the following components:      Result Value   WBC 3.7 (*)    RBC 4.04 (*)    Hemoglobin 12.8 (*)    HCT 37.9 (*)    All other components within normal limits  BASIC METABOLIC PANEL - Abnormal; Notable for the following components:   Sodium 127 (*)    Potassium 3.2 (*)    Chloride 97 (*)    CO2 21 (*)    Glucose, Bld 136 (*)    Calcium 8.2 (*)    All other components within normal limits  CBG MONITORING, ED - Abnormal; Notable for the following components:   Glucose-Capillary 144 (*)    All other components within normal limits    EKG EKG Interpretation  Date/Time:  Monday June 13 2020 21:30:44 EDT Ventricular Rate:  66 PR Interval:    QRS Duration: 100 QT Interval:  430 QTC Calculation: 451 R Axis:   52 Text Interpretation: Sinus rhythm Prolonged PR interval Probable anteroseptal infarct, old pac resolved since last tracing Confirmed by Dorie Rank [phone]) on 06/13/2020 9:39:22 PM   Radiology No results found.  Procedures Procedures (including critical care time)  Medications Ordered in ED Medications  sodium chloride 0.9 % bolus 500 mL (0 mLs Intravenous Stopped 06/13/20 2332)    Followed by  0.9 %  sodium chloride infusion (1,000 mLs Intravenous New Bag/Given 06/13/20 2258)    ED Course  I have reviewed the triage vital signs and the nursing notes.  Pertinent labs & imaging results that were available during my care of the patient were reviewed by me and considered in my medical decision making (see chart for  details).    MDM Rules/Calculators/A&P                          Patient presented to ED for evaluation of a syncopal episode.  Patient was initially noted to be bradycardic.  On my exam the patient was normotensive and was no longer bradycardic.  Patient was feeling well.  No focal neurologic signs.  Doubt TIA stroke or seizure.  Suspect syncopal episode.  No cardiac risk factors.  May be related to dehydration and vasovagal episode.  Plan on checking labs IV hydration and reassess.  Care turned over to Dr Dina Rich. Final Clinical Impression(s) / ED Diagnoses Final diagnoses:  Syncope and collapse    Rx / DC Orders ED Discharge Orders    None       Dorie Rank, MD 06/13/20 2351

## 2020-06-13 NOTE — ED Triage Notes (Signed)
Pt was at home when he "felt like he had to pee" and suddenly when "slumped over to the L and went unconscious. Pt suddenly  Regained consciousness  but was confused. EMS reports a blood sugar of 158 and has given 1 L of NS  in route. Pt present alert and oriented x4.

## 2020-06-14 DIAGNOSIS — R55 Syncope and collapse: Secondary | ICD-10-CM | POA: Diagnosis not present

## 2020-06-14 LAB — URINALYSIS, ROUTINE W REFLEX MICROSCOPIC
Bilirubin Urine: NEGATIVE
Glucose, UA: 150 mg/dL — AB
Hgb urine dipstick: NEGATIVE
Ketones, ur: NEGATIVE mg/dL
Leukocytes,Ua: NEGATIVE
Nitrite: NEGATIVE
Protein, ur: NEGATIVE mg/dL
Specific Gravity, Urine: 1.006 (ref 1.005–1.030)
pH: 7 (ref 5.0–8.0)

## 2020-06-14 NOTE — Discharge Instructions (Addendum)
You were seen today for an episode of passing out.  This is likely related to some mild dehydration and low sodium.  Increase her sodium and water intake.  Follow-up closely with your primary doctor for recheck of labs.

## 2020-06-14 NOTE — ED Provider Notes (Signed)
Patient signed out pending recheck and labs.  In brief, had a syncopal episode with prodrome.  Reports dizziness and feeling like he was going to pass out.  EKG without acute arrhythmia or ischemic changes.  Lab work reviewed and notable for hyponatremia with a sodium of 127, mild hypo kalemia with potassium of 3.2.  Likely related to dehydration.  Patient reports history of hyponatremia.  Last sodium 130.  He was given a total of 1 L bolus followed by 125/h x 4 hours.  Urinalysis without obvious infection  On recheck, patient states he feels much better.  He is ambulatory independently.  He has no complaints.  His work-up is reassuring.  I did encourage him to aggressively hydrate and increase sodium intake.  He will need to follow-up with his primary physician for recheck of labs in 1 week.  After history, exam, and medical workup I feel the patient has been appropriately medically screened and is safe for discharge home. Pertinent diagnoses were discussed with the patient. Patient was given return precautions.  Problem List Items Addressed This Visit    None    Visit Diagnoses    Syncope and collapse    -  Primary   Dehydration       Hyponatremia            Dina Rich Barbette Hair, MD 06/14/20 [phone]

## 2020-06-14 NOTE — ED Notes (Signed)
Patient verbalizes understanding of discharge instructions. Opportunity for questioning and answers were provided. Armband removed by staff, pt discharged from ED stable & ambulatory with family.

## 2020-06-22 DIAGNOSIS — I10 Essential (primary) hypertension: Secondary | ICD-10-CM | POA: Diagnosis not present

## 2020-06-22 DIAGNOSIS — K219 Gastro-esophageal reflux disease without esophagitis: Secondary | ICD-10-CM | POA: Diagnosis not present

## 2020-06-22 DIAGNOSIS — E039 Hypothyroidism, unspecified: Secondary | ICD-10-CM | POA: Diagnosis not present

## 2020-06-22 DIAGNOSIS — E782 Mixed hyperlipidemia: Secondary | ICD-10-CM | POA: Diagnosis not present

## 2020-06-22 DIAGNOSIS — M05749 Rheumatoid arthritis with rheumatoid factor of unspecified hand without organ or systems involvement: Secondary | ICD-10-CM | POA: Diagnosis not present

## 2020-06-23 ENCOUNTER — Ambulatory Visit (HOSPITAL_COMMUNITY): Payer: Medicare Other | Attending: Internal Medicine

## 2020-06-23 ENCOUNTER — Encounter: Payer: Self-pay | Admitting: Cardiovascular Disease

## 2020-06-23 ENCOUNTER — Ambulatory Visit (INDEPENDENT_AMBULATORY_CARE_PROVIDER_SITE_OTHER): Payer: Medicare Other | Admitting: Cardiovascular Disease

## 2020-06-23 ENCOUNTER — Other Ambulatory Visit: Payer: Self-pay

## 2020-06-23 VITALS — BP 134/66 | HR 74 | Ht 68.0 in | Wt 152.0 lb

## 2020-06-23 DIAGNOSIS — I349 Nonrheumatic mitral valve disorder, unspecified: Secondary | ICD-10-CM | POA: Diagnosis not present

## 2020-06-23 DIAGNOSIS — I517 Cardiomegaly: Secondary | ICD-10-CM | POA: Diagnosis not present

## 2020-06-23 DIAGNOSIS — I340 Nonrheumatic mitral (valve) insufficiency: Secondary | ICD-10-CM

## 2020-06-23 LAB — ECHOCARDIOGRAM COMPLETE
Area-P 1/2: 4.49 cm2
MV M vel: 6.38 m/s
MV Peak grad: 162.8 mmHg
Radius: 0.7 cm
S' Lateral: 3.1 cm

## 2020-06-23 NOTE — Patient Instructions (Addendum)
Medication Instructions:  Your provider recommends that you continue on your current medications as directed. Please refer to the Current Medication list given to you today.   *If you need a refill on your cardiac medications before your next appointment, please call your pharmacy*  Lab Work: TODAY: BNP If you have labs (blood work) drawn today and your tests are completely normal, you will receive your results only by: Marland Kitchen MyChart Message (if you have MyChart) OR . A paper copy in the mail If you have any lab test that is abnormal or we need to change your treatment, we will call you to review the results.  Testing/Procedures: Your physician has requested that you have a stress echocardiogram. For further information please visit [email or web address]. Please follow instruction sheet as given.  Follow-Up: We will contact you after your testing for further planning.

## 2020-06-23 NOTE — Progress Notes (Signed)
Cardiology Office Note:    Date:  06/26/2020   ID:  Cristian Nunez, DOB 08-Aug-1946, MRN [id number]  PCP:  Renaldo Reel, PA  [facility] Cardiologist:  No primary care provider on file.  [facility] Electrophysiologist:  None   Referring MD: Renaldo Reel, PA   Chief Complaint  Patient presents with  . Mitral Regurgitation    History of Present Illness:    Cristian Nunez is a 74 y.o. male with a hx of mitral valve prolapse, referred for evaluation of severe mitral regurgitation by Dr. Geraldo Pitter.  The patient is here with his wife today. His daughter Jeannene Patella is Surveyor, quantity of the short stay at [employer].  The patient has a history of longstanding mitral valve prolapse with mitral regurgitation.  He was seen by Dr. Geraldo Pitter in June 2021 with recommendation for valve clinic evaluation.  The patient is active.  He reports regularly walking 1 to 2 miles on most days without any exertional symptoms.  He specifically denies any symptoms of heart palpitations, chest pain, chest pressure, shortness of breath, orthopnea, PND, lightheadedness, or syncope.  He has no history of rheumatic fever or rheumatic heart disease.  Past Medical History:  Diagnosis Date  . Arthritis    psoriatic  . Cancer ([place])    thyroid  . GERD (gastroesophageal reflux disease)   . Hyperlipidemia   . Hypertension   . 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 Mitral valve prolapse     Past Surgical History:  Procedure Laterality Date  . HERNIA REPAIR Bilateral   . INGUINAL HERNIA REPAIR Left 01/28/2019   Procedure: LEFT INGUINAL HERNIA REPAIR WITH MESH;  Surgeon: Rolm Bookbinder, MD;  Location: [place];  Service: General;  Laterality: Left;  GENERAL ANESTHESIA AND TAP BLOCK  . KNEE ARTHROSCOPY    . THYROIDECTOMY, PARTIAL    . VARICOSE VEIN SURGERY      Current Medications: Current Meds  Medication Sig  . amoxicillin (AMOXIL) 500 MG capsule   . aspirin-acetaminophen-caffeine (EXCEDRIN MIGRAINE)  250-250-65 MG tablet Take 1 tablet by mouth every 6 (six) hours as needed for headache.  . b complex vitamins tablet Take 1 tablet by mouth daily.  . Biotin 5 MG CAPS Take 5 mg by mouth daily.  . Calcium Citrate-Vitamin D (CALCIUM + D PO) Take 1 tablet by mouth daily.  . clindamycin (CLEOCIN T) 1 % lotion   . clobetasol ointment (TEMOVATE) 0.05 %   . EUTHYROX 75 MCG tablet   . Ixekizumab (TALTZ) 80 MG/ML SOSY Inject 80 mg into the skin every 30 (thirty) days.   Marland Kitchen ketoconazole (NIZORAL) 2 % shampoo ROTATE WITH OTC SHAMPOOS  . levothyroxine (SYNTHROID) 50 MCG tablet   . methocarbamol (ROBAXIN) 500 MG tablet Take 500 mg by mouth every 12 (twelve) hours as needed for muscle pain. for pain  . mupirocin ointment (BACTROBAN) 2 % Apply topically 2 (two) times daily as needed.  Marland Kitchen olmesartan (BENICAR) 40 MG tablet Take 40 mg by mouth daily.  Marland Kitchen omeprazole (PRILOSEC) 40 MG capsule Take 40 mg by mouth daily.  . pseudoephedrine (SUDAFED) 30 MG tablet Take 30 mg by mouth daily as needed for congestion.  . rosuvastatin (CRESTOR) 10 MG tablet Take 10 mg by mouth daily.  . vitamin B-12 (CYANOCOBALAMIN) 1000 MCG tablet Take 1,000 mcg by mouth 2 (two) times a week.     Allergies:   Patient has no known allergies.   Social History   Socioeconomic  History  . Marital status: Married    Spouse name: Not on file  . Number of children: Not on file  . Years of education: Not on file  . Highest education level: Not on file  Occupational History  . Not on file  Tobacco Use  . Smoking status: Never Smoker  . Smokeless tobacco: Never Used  Vaping Use  . Vaping Use: Never used  Substance and Sexual Activity  . Alcohol use: Yes    Alcohol/week: 7.0 standard drinks    Types: 7 Cans of beer per week  . Drug use: Never  . Sexual activity: Not on file  Other Topics Concern  . Not on file  Social History Narrative  . Not on file   Social Determinants of Health   Financial Resource Strain:   . Difficulty  of Paying Living Expenses:   Food Insecurity:   . Worried About Charity fundraiser in the Last Year:   . Arboriculturist in the Last Year:   Transportation Needs:   . Film/video editor (Medical):   Marland Kitchen Lack of Transportation (Non-Medical):   Physical Activity:   . Days of Exercise per Week:   . Minutes of Exercise per Session:   Stress:   . Feeling of Stress :   Social Connections:   . Frequency of Communication with Friends and Family:   . Frequency of Social Gatherings with Friends and Family:   . Attends Religious Services:   . Active Member of Clubs or Organizations:   . Attends Archivist Meetings:   Marland Kitchen Marital Status:      Family History: The patient's family history is not on file.  ROS:   Please see the history of present illness.    All other systems reviewed and are negative.  EKGs/Labs/Other Studies Reviewed:    The following studies were reviewed today: 2D echocardiogram 06/23/2020: 1. Left ventricular ejection fraction, by estimation, is 60 to 65%. The  left ventricle has normal function. The left ventricle has no regional  wall motion abnormalities. Left ventricular diastolic parameters are  consistent with Grade I diastolic  dysfunction (impaired relaxation). Elevated left ventricular end-diastolic  pressure.  2. Right ventricular systolic function is normal. The right ventricular  size is normal. There is normal pulmonary artery systolic pressure.  3. Left atrial size was severely dilated.  4. Moderate elongation of the mitral anterior and posterior leaflets.  5. The mitral valve is myxomatous. 2 separate moderate to severe jets  mitral valve regurgitation. In summary, there is severe regurgitation by  combined PISA.  6. The aortic valve is tricuspid. Aortic valve regurgitation is not  visualized.  7. The inferior vena cava is normal in size with greater than 50%  respiratory variability, suggesting right atrial pressure of 3 mmHg.    Comparison(s): 12/16/18 EF 60-65%. Moderate-severe MR. Recommend further  evaluation with TEE and work-up for possible MV repair or replacement.   Recent Labs: 06/13/2020: BUN 13; Creatinine, Ser 0.80; Hemoglobin 12.8; Platelets 159; Potassium 3.2; Sodium 127 06/23/2020: NT-Pro BNP 480  Recent Lipid Panel    Component Value Date/Time   CHOL 145 05/20/2019 1017   TRIG 96 05/20/2019 1017   HDL 46 05/20/2019 1017   CHOLHDL 3.2 05/20/2019 1017   LDLCALC 80 05/20/2019 1017    Physical Exam:    VS:  BP 134/66   Pulse 74   Ht 5\' 8"  (1.727 m)   Wt 152 lb (68.9 kg)  SpO2 94%   BMI 23.11 kg/m     Wt Readings from Last 3 Encounters:  06/23/20 152 lb (68.9 kg)  06/13/20 154 lb (69.9 kg)  05/18/20 153 lb (69.4 kg)     GEN:  Well nourished, well developed in no acute distress HEENT: Normal NECK: No JVD; No carotid bruits LYMPHATICS: No lymphadenopathy CARDIAC: RRR, 3/6 mid/late systolic murmur at the apex RESPIRATORY:  Clear to auscultation without rales, wheezing or rhonchi  ABDOMEN: Soft, non-tender, non-distended MUSCULOSKELETAL:  No edema; No deformity  SKIN: Warm and dry NEUROLOGIC:  Alert and oriented x 3 PSYCHIATRIC:  Normal affect   ASSESSMENT:    1. Nonrheumatic mitral valve regurgitation    PLAN:    In order of problems listed above:  1. The patient has severe, stage C mitral regurgitation.  He appears to have good functional capacity with no cardiac symptoms at all.  LV function is preserved.  I have personally reviewed his echo which demonstrates bileaflet mitral valve prolapse and 2 distinct jets of mitral regurgitation, resulting in 3-4+ mitral regurgitation.  Left atrium is dilated and there is no evidence of baseline pulmonary hypertension.  The patient has no history of atrial fibrillation.  We discussed the natural history of mitral regurgitation, the importance of ongoing surveillance, the need to evaluate for cardiac chamber dilatation and dysfunction as well  as the presence of pulmonary hypertension.  We discussed ongoing surveillance and treatment considerations including elective surgical consultation for consideration of mitral valve repair.  As the patient is completely asymptomatic, will check a BNP to evaluate for signs of myocardial strain.  We will also check an exercise echocardiogram to objectively look at his functional capacity and evaluate dynamic changes in mitral regurgitation/pulmonary hypertension.  If his studies are reassuring, recommend a repeat echocardiogram in 6 months with a follow-up visit at that time.  If there are significant abnormalities noted in his exercise testing or BNP, will pursue further evaluation with transesophageal echo, right and left heart catheterization, and surgical evaluation.   Medication Adjustments/Labs and Tests Ordered: Current medicines are reviewed at length with the patient today.  Concerns regarding medicines are outlined above.  Orders Placed This Encounter  Procedures  . Pro b natriuretic peptide (BNP)  . ECHOCARDIOGRAM STRESS TEST   No orders of the defined types were placed in this encounter.   Patient Instructions  Medication Instructions:  Your provider recommends that you continue on your current medications as directed. Please refer to the Current Medication list given to you today.   *If you need a refill on your cardiac medications before your next appointment, please call your pharmacy*  Lab Work: TODAY: BNP If you have labs (blood work) drawn today and your tests are completely normal, you will receive your results only by: Marland Kitchen MyChart Message (if you have MyChart) OR . A paper copy in the mail If you have any lab test that is abnormal or we need to change your treatment, we will call you to review the results.  Testing/Procedures: Your physician has requested that you have a stress echocardiogram. For further information please visit [email or web address]. Please follow  instruction sheet as given.  Follow-Up: We will contact you after your testing for further planning.     Signed, Sherren Mocha, MD  06/26/2020 1:50 PM    [facility]

## 2020-06-24 LAB — PRO B NATRIURETIC PEPTIDE: NT-Pro BNP: 480 pg/mL (ref 0–486)

## 2020-06-25 ENCOUNTER — Encounter: Payer: Self-pay | Admitting: Cardiovascular Disease

## 2020-06-27 DIAGNOSIS — E871 Hypo-osmolality and hyponatremia: Secondary | ICD-10-CM | POA: Diagnosis not present

## 2020-06-28 DIAGNOSIS — I700 Atherosclerosis of aorta: Secondary | ICD-10-CM | POA: Diagnosis not present

## 2020-06-28 DIAGNOSIS — I2510 Atherosclerotic heart disease of native coronary artery without angina pectoris: Secondary | ICD-10-CM | POA: Diagnosis not present

## 2020-06-28 DIAGNOSIS — E871 Hypo-osmolality and hyponatremia: Secondary | ICD-10-CM | POA: Diagnosis not present

## 2020-06-28 DIAGNOSIS — I771 Stricture of artery: Secondary | ICD-10-CM | POA: Diagnosis not present

## 2020-06-30 DIAGNOSIS — E871 Hypo-osmolality and hyponatremia: Secondary | ICD-10-CM | POA: Diagnosis not present

## 2020-07-11 DIAGNOSIS — I10 Essential (primary) hypertension: Secondary | ICD-10-CM | POA: Diagnosis not present

## 2020-07-18 ENCOUNTER — Other Ambulatory Visit: Payer: Medicare Other

## 2020-07-21 ENCOUNTER — Telehealth (HOSPITAL_COMMUNITY): Payer: Self-pay | Admitting: *Deleted

## 2020-07-21 NOTE — Telephone Encounter (Signed)
Left message on voicemail per DPR in reference to upcoming appointment scheduled on 07/27/20 with detailed instructions given per Myocardial Perfusion Study Information Sheet for the test. LM to arrive 15 minutes early, and that it is imperative to arrive on time for appointment to keep from having the test rescheduled. If you need to cancel or reschedule your appointment, please call the office within 24 hours of your appointment. Failure to do so may result in a cancellation of your appointment, and a $50 no show fee. Phone number given for call back for any questions. Kirstie Peri

## 2020-07-27 ENCOUNTER — Encounter (HOSPITAL_COMMUNITY): Payer: Self-pay | Admitting: Cardiology

## 2020-07-27 ENCOUNTER — Other Ambulatory Visit (HOSPITAL_COMMUNITY): Payer: Medicare Other

## 2020-07-27 ENCOUNTER — Telehealth (HOSPITAL_COMMUNITY): Payer: Self-pay | Admitting: *Deleted

## 2020-07-27 NOTE — Progress Notes (Unsigned)
Patient ID: Cristian Nunez, male   DOB: Apr 20, 1946, 74 y.o.   MRN: [id number]   Patient arrived late for stress echocardiogram appointment, unable to complete study at this time.

## 2020-07-27 NOTE — Telephone Encounter (Signed)
This patient was scheduled for a stress echo today at 1:50.  The patient was left a message on his phone to be here at 1:30 but they didn't arrive until closer to 2:00.  The patient's wife was very hostile about the fact we could not start his test at that time as there was another patient due in less than one hour.  They said they got conflicting times to be here.  I tried to explain that we call all our patients but she did not want to hear what I had to say.  I also tried to verify their phone numbers to make sure we had the correct numbers but she would not stay to let me do this.  She said they would take it up with Dr. Lennox Pippins.  Dr. Lennox Pippins made aware.

## 2020-08-01 NOTE — Telephone Encounter (Signed)
Cristian Nunez left me a voicemail request a call back. I called back and received their feedback. Their original appt was scheduled for 8/18 @ 2:05p and they were well aware that someone had told them to arrive by 1:30p. On 8/17, they saw an update come through on MyChart that showed their appointment was changed to 1:50pm so they thought that this was their new arrival time. When they arrived at 1:50pm they were discouraged to hear that they were late and the appointment would have to be rescheduled. They were confused because the clock on the wall showed 1:50pm. 2 women came from the back to let them know that they couldn't be seen today

## 2020-08-10 ENCOUNTER — Telehealth (HOSPITAL_COMMUNITY): Payer: Self-pay | Admitting: *Deleted

## 2020-08-10 NOTE — Telephone Encounter (Signed)
Patient given detailed instructions per Stress Test Requisition Sheet for test on 08/17/2020 at 2000.Patient Notified to arrive 30 minutes early, and that it is imperative to arrive on time for appointment to keep from having the test rescheduled.  Patient verbalized understanding. Cobie Leidner, Ranae Palms

## 2020-08-13 ENCOUNTER — Other Ambulatory Visit (HOSPITAL_COMMUNITY)
Admission: RE | Admit: 2020-08-13 | Discharge: 2020-08-13 | Disposition: A | Discharge status: Home / Self Care | Payer: Medicare Other | Source: Ambulatory Visit | Attending: Cardiovascular Disease | Admitting: Cardiovascular Disease

## 2020-08-13 DIAGNOSIS — Z01812 Encounter for preprocedural laboratory examination: Secondary | ICD-10-CM | POA: Diagnosis not present

## 2020-08-13 DIAGNOSIS — Z20822 Contact with and (suspected) exposure to covid-19: Secondary | ICD-10-CM | POA: Diagnosis not present

## 2020-08-13 LAB — SARS CORONAVIRUS 2 (TAT 6-24 HRS): SARS Coronavirus 2: NEGATIVE

## 2020-08-17 ENCOUNTER — Ambulatory Visit (HOSPITAL_COMMUNITY): Payer: Medicare Other | Attending: Cardiovascular Disease

## 2020-08-17 ENCOUNTER — Ambulatory Visit (HOSPITAL_BASED_OUTPATIENT_CLINIC_OR_DEPARTMENT_OTHER): Payer: Medicare Other

## 2020-08-17 ENCOUNTER — Other Ambulatory Visit: Payer: Self-pay

## 2020-08-17 DIAGNOSIS — I340 Nonrheumatic mitral (valve) insufficiency: Secondary | ICD-10-CM | POA: Insufficient documentation

## 2020-08-24 DIAGNOSIS — E875 Hyperkalemia: Secondary | ICD-10-CM | POA: Diagnosis not present

## 2020-10-14 DIAGNOSIS — Z23 Encounter for immunization: Secondary | ICD-10-CM | POA: Diagnosis not present

## 2020-10-17 ENCOUNTER — Other Ambulatory Visit (HOSPITAL_BASED_OUTPATIENT_CLINIC_OR_DEPARTMENT_OTHER): Payer: Self-pay | Admitting: Internal Medicine

## 2020-10-17 ENCOUNTER — Ambulatory Visit: Payer: Medicare Other | Attending: Internal Medicine

## 2020-10-17 DIAGNOSIS — Z23 Encounter for immunization: Secondary | ICD-10-CM

## 2020-10-17 NOTE — Progress Notes (Signed)
   Covid-19 Vaccination Clinic  Name:  Cristian Nunez    MRN: [id number] DOB: 1946-07-27  10/17/2020  Mr. Cristian Nunez was observed post Covid-19 immunization for 15 minutes without incident. He was provided with Vaccine Information Sheet and instruction to access the V-Safe system.   Mr. Cristian Nunez was instructed to call 911 with any severe reactions post vaccine: Marland Kitchen Difficulty breathing  . Swelling of face and throat  . A fast heartbeat  . A bad rash all over body  . Dizziness and weakness

## 2020-10-21 MED FILL — PFIZER-BIONTECH COVID-19 VA: 30 | 1 days supply | Qty: 0 | Fill #0

## 2020-10-26 DIAGNOSIS — E871 Hypo-osmolality and hyponatremia: Secondary | ICD-10-CM | POA: Diagnosis not present

## 2020-11-07 DIAGNOSIS — L821 Other seborrheic keratosis: Secondary | ICD-10-CM | POA: Diagnosis not present

## 2020-11-07 DIAGNOSIS — L400 Psoriasis vulgaris: Secondary | ICD-10-CM | POA: Diagnosis not present

## 2020-11-07 DIAGNOSIS — L578 Other skin changes due to chronic exposure to nonionizing radiation: Secondary | ICD-10-CM | POA: Diagnosis not present

## 2020-11-07 DIAGNOSIS — L4050 Arthropathic psoriasis, unspecified: Secondary | ICD-10-CM | POA: Diagnosis not present

## 2020-11-10 DIAGNOSIS — Z111 Encounter for screening for respiratory tuberculosis: Secondary | ICD-10-CM | POA: Diagnosis not present

## 2020-11-10 DIAGNOSIS — L400 Psoriasis vulgaris: Secondary | ICD-10-CM | POA: Diagnosis not present

## 2020-11-10 DIAGNOSIS — R531 Weakness: Secondary | ICD-10-CM | POA: Diagnosis not present

## 2020-11-15 ENCOUNTER — Other Ambulatory Visit: Payer: Self-pay

## 2020-11-15 DIAGNOSIS — I10 Essential (primary) hypertension: Secondary | ICD-10-CM | POA: Insufficient documentation

## 2020-11-15 DIAGNOSIS — K219 Gastro-esophageal reflux disease without esophagitis: Secondary | ICD-10-CM | POA: Insufficient documentation

## 2020-11-15 DIAGNOSIS — M1990 Unspecified osteoarthritis, unspecified site: Secondary | ICD-10-CM | POA: Insufficient documentation

## 2020-11-15 DIAGNOSIS — C801 Malignant (primary) neoplasm, unspecified: Secondary | ICD-10-CM | POA: Insufficient documentation

## 2020-11-15 DIAGNOSIS — Z85850 Personal history of malignant neoplasm of thyroid: Secondary | ICD-10-CM | POA: Insufficient documentation

## 2020-11-15 DIAGNOSIS — E039 Hypothyroidism, unspecified: Secondary | ICD-10-CM | POA: Insufficient documentation

## 2020-11-15 DIAGNOSIS — E785 Hyperlipidemia, unspecified: Secondary | ICD-10-CM | POA: Insufficient documentation

## 2020-11-16 ENCOUNTER — Other Ambulatory Visit: Payer: Self-pay

## 2020-11-16 ENCOUNTER — Ambulatory Visit (INDEPENDENT_AMBULATORY_CARE_PROVIDER_SITE_OTHER): Payer: Medicare Other | Admitting: Cardiology

## 2020-11-16 ENCOUNTER — Encounter: Payer: Self-pay | Admitting: Cardiology

## 2020-11-16 VITALS — BP 138/80 | HR 66 | Ht 69.0 in | Wt 156.0 lb

## 2020-11-16 DIAGNOSIS — I10 Essential (primary) hypertension: Secondary | ICD-10-CM | POA: Diagnosis not present

## 2020-11-16 DIAGNOSIS — E782 Mixed hyperlipidemia: Secondary | ICD-10-CM | POA: Diagnosis not present

## 2020-11-16 DIAGNOSIS — I341 Nonrheumatic mitral (valve) prolapse: Secondary | ICD-10-CM

## 2020-11-16 DIAGNOSIS — I340 Nonrheumatic mitral (valve) insufficiency: Secondary | ICD-10-CM | POA: Diagnosis not present

## 2020-11-16 NOTE — Progress Notes (Signed)
Cardiology Office Note:    Date:  11/16/2020   ID:  Cristian Nunez, DOB May 16, 1946, MRN [id number]  PCP:  Renaldo Reel, PA  Cardiologist:  Jenean Lindau, MD   Referring MD: Renaldo Reel, PA    ASSESSMENT:    1. Essential hypertension   2. Mitral valve prolapse   3. Mitral valve insufficiency, unspecified etiology   4. Mixed dyslipidemia    PLAN:    In order of problems listed above:  1. Mitral regurgitation: This is significant and gets severe and worsens with peak of exercise with mild pulmonary hypertension.  Therefore he has been referred for possible surgical evaluation and repair of the valve.  He has an appointment coming in January after the holidays.  Currently he is asymptomatic with activities of daily living.  I reviewed stress echo report with him at length. 2. Essential hypertension: Blood pressure stable.  ARB has been discontinued by primary care physician because of hyperkalemia which has been persistent. 3. Mixed dyslipidemia: Diet was emphasized.  He is on lipid-lowering therapy. 4. Patient will be seen in follow-up appointment in 6 months or earlier if the patient has any concerns    Medication Adjustments/Labs and Tests Ordered: Current medicines are reviewed at length with the patient today.  Concerns regarding medicines are outlined above.  No orders of the defined types were placed in this encounter.  No orders of the defined types were placed in this encounter.    No chief complaint on file.    History of Present Illness:    Cristian Nunez is a 74 y.o. male.  Patient has past medical history of essential hypertension dyslipidemia.  He has mitral valve prolapse with regurgitation.  He denies any problems at this time and takes care of activities of daily living.  No chest pain orthopnea or PND.  He had an appointment with our structural heart disease and valvular heart disease evaluation.  This was for mitral regurgitation.  He underwent stress  testing and his mitral valve regurgitation was found to be significant.  He has been given an appointment with the cardiovascular surgeon in January.  Patient denies any problems at this time he takes care of activities of daily living.  He walks him on a regular basis.  At the time of my evaluation, the patient is alert awake oriented and in no distress.  Past Medical History:  Diagnosis Date  . Abnormal EKG 12/09/2018  . Arthritis    psoriatic  . Cancer ([place])    thyroid  . Essential hypertension 12/09/2018  . GERD (gastroesophageal reflux disease)   . Hyperlipidemia   . Hypertension   . 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 Mitral regurgitation 11/18/2019  . Mitral valve prolapse   . Mixed dyslipidemia 12/09/2018    Past Surgical History:  Procedure Laterality Date  . HERNIA REPAIR Bilateral   . INGUINAL HERNIA REPAIR Left 01/28/2019   Procedure: LEFT INGUINAL HERNIA REPAIR WITH MESH;  Surgeon: Rolm Bookbinder, MD;  Location: [facility];  Service: General;  Laterality: Left;  GENERAL ANESTHESIA AND TAP BLOCK  . KNEE ARTHROSCOPY    . THYROIDECTOMY, PARTIAL    . VARICOSE VEIN SURGERY      Current Medications: Current Meds  Medication Sig  . amLODipine (NORVASC) 5 MG tablet Take 5 mg by mouth daily.  Marland Kitchen aspirin-acetaminophen-caffeine (EXCEDRIN MIGRAINE) 250-250-65 MG tablet Take 1 tablet by mouth every 6 (six) hours as needed for headache.  Marland Kitchen  b complex vitamins tablet Take 1 tablet by mouth daily.  . Biotin 5 MG CAPS Take 5 mg by mouth daily.  . Calcium Citrate-Vitamin D (CALCIUM + D PO) Take 1 tablet by mouth daily.  . clindamycin (CLEOCIN T) 1 % lotion as needed.   . clobetasol ointment (TEMOVATE) 0.05 % as needed.   Arna Medici 75 MCG tablet Take 75 mcg by mouth every other day.   . hydrochlorothiazide (HYDRODIURIL) 12.5 MG tablet Take 12.5 mg by mouth daily.   . Ixekizumab (TALTZ) 80 MG/ML SOSY Inject 80 mg into the skin every 30 (thirty) days.   Marland Kitchen ketoconazole (NIZORAL)  2 % shampoo ROTATE WITH OTC SHAMPOOS  . levothyroxine (SYNTHROID) 50 MCG tablet Take 50 mcg by mouth every other day.   . methocarbamol (ROBAXIN) 500 MG tablet Take 500 mg by mouth every 12 (twelve) hours as needed for muscle pain. for pain  . mupirocin ointment (BACTROBAN) 2 % Apply topically 2 (two) times daily as needed.  Marland Kitchen omeprazole (PRILOSEC) 20 MG capsule Take 20 mg by mouth daily.  . pseudoephedrine (SUDAFED) 30 MG tablet Take 30 mg by mouth daily as needed for congestion.  . rosuvastatin (CRESTOR) 10 MG tablet Take 10 mg by mouth daily.  . vitamin B-12 (CYANOCOBALAMIN) 1000 MCG tablet Take 1,000 mcg by mouth 2 (two) times a week.     Allergies:   Patient has no known allergies.   Social History   Socioeconomic History  . Marital status: Married    Spouse name: Not on file  . Number of children: Not on file  . Years of education: Not on file  . Highest education level: Not on file  Occupational History  . Not on file  Tobacco Use  . Smoking status: Never Smoker  . Smokeless tobacco: Never Used  Vaping Use  . Vaping Use: Never used  Substance and Sexual Activity  . Alcohol use: Yes    Alcohol/week: 7.0 standard drinks    Types: 7 Cans of beer per week  . Drug use: Never  . Sexual activity: Not on file  Other Topics Concern  . Not on file  Social History Narrative  . Not on file   Social Determinants of Health   Financial Resource Strain:   . Difficulty of Paying Living Expenses: Not on file  Food Insecurity:   . Worried About Charity fundraiser in the Last Year: Not on file  . Ran Out of Food in the Last Year: Not on file  Transportation Needs:   . Lack of Transportation (Medical): Not on file  . Lack of Transportation (Non-Medical): Not on file  Physical Activity:   . Days of Exercise per Week: Not on file  . Minutes of Exercise per Session: Not on file  Stress:   . Feeling of Stress : Not on file  Social Connections:   . Frequency of Communication with  Friends and Family: Not on file  . Frequency of Social Gatherings with Friends and Family: Not on file  . Attends Religious Services: Not on file  . Active Member of Clubs or Organizations: Not on file  . Attends Archivist Meetings: Not on file  . Marital Status: Not on file     Family History: The patient's family history includes Heart Problems in his maternal grandmother; Hypertension in his mother.  ROS:   Please see the history of present illness.    All other systems reviewed and are negative.  EKGs/Labs/Other  Studies Reviewed:    The following studies were reviewed today: I discussed my findings with the patient at length.  Including stress echo findings.   Recent Labs: 06/13/2020: BUN 13; Creatinine, Ser 0.80; Hemoglobin 12.8; Platelets 159; Potassium 3.2; Sodium 127 06/23/2020: NT-Pro BNP 480  Recent Lipid Panel    Component Value Date/Time   CHOL 145 05/20/2019 1017   TRIG 96 05/20/2019 1017   HDL 46 05/20/2019 1017   CHOLHDL 3.2 05/20/2019 1017   LDLCALC 80 05/20/2019 1017    Physical Exam:    VS:  BP 138/80   Pulse 66   Ht 5\' 9"  (1.753 m)   Wt 156 lb (70.8 kg)   SpO2 99%   BMI 23.04 kg/m     Wt Readings from Last 3 Encounters:  11/16/20 156 lb (70.8 kg)  06/23/20 152 lb (68.9 kg)  06/13/20 154 lb (69.9 kg)     GEN: Patient is in no acute distress HEENT: Normal NECK: No JVD; No carotid bruits LYMPHATICS: No lymphadenopathy CARDIAC: Hear sounds regular, 2/6 systolic murmur at the apex. RESPIRATORY:  Clear to auscultation without rales, wheezing or rhonchi  ABDOMEN: Soft, non-tender, non-distended MUSCULOSKELETAL:  No edema; No deformity  SKIN: Warm and dry NEUROLOGIC:  Alert and oriented x 3 PSYCHIATRIC:  Normal affect   Signed, Jenean Lindau, MD  11/16/2020 8:22 AM    [facility]

## 2020-11-16 NOTE — Patient Instructions (Signed)

## 2020-12-12 ENCOUNTER — Ambulatory Visit (INDEPENDENT_AMBULATORY_CARE_PROVIDER_SITE_OTHER): Payer: Medicare Other | Admitting: Cardiovascular Disease

## 2020-12-12 ENCOUNTER — Encounter: Payer: Self-pay | Admitting: Cardiovascular Disease

## 2020-12-12 ENCOUNTER — Other Ambulatory Visit: Payer: Self-pay

## 2020-12-12 VITALS — BP 142/76 | HR 73 | Ht 69.0 in | Wt 159.2 lb

## 2020-12-12 DIAGNOSIS — I340 Nonrheumatic mitral (valve) insufficiency: Secondary | ICD-10-CM | POA: Diagnosis not present

## 2020-12-12 NOTE — Progress Notes (Signed)
TEE scheduled 02/03/21 at 0900 with Dr. Eden Emms  Dx: MR Case ID [id number]  L/RHC scheduled 02/03/2021 at 1030 with Dr. Excell Seltzer Dx: MR

## 2020-12-12 NOTE — Progress Notes (Signed)
Cardiology Office Note:    Date:  12/15/2020   ID:  Cristian Nunez, DOB September 01, 1946, MRN [id number]  PCP:  Renaldo Reel, PA  [facility] Cardiologist:  No primary care provider on file.  [facility] Electrophysiologist:  None   Referring MD: Renaldo Reel, PA   Chief Complaint  Patient presents with  . Mitral Regurgitation    History of Present Illness:    Cristian Nunez is a 75 y.o. male with a hx of mitral valve prolapse with severe mitral regurgitation, presenting for follow-up evaluation.  I saw the patient initially in July 2021 and elected to proceed with a stress echocardiogram and BNP in the setting of his asymptomatic status.  The stress echocardiogram demonstrated fair exercise tolerance at 6 minutes and 30 seconds according to the [facility] protocol.  He was noted to have severe mitral regurgitation at baseline that worsened with exercise.  He developed findings of mild pulmonary hypertension at peak exercise.  His BNP level was 480 which is the upper limit of normal.  I discussed surgical referral with him at that time but he preferred to follow-up closely as he did not want to move forward with any intervention during the COVID-19 pandemic.  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 He continues to deny any other functional limitation. He walks at least twice per week and walks 35 minutes without stopping to rest. He develops back discomfort with this level of walking but denies any shortness of breath or chest discomfort. Today, he denies symptoms of palpitations, chest pain, shortness of breath, orthopnea, PND, lower extremity edema, dizziness, or syncope.  Past Medical History:  Diagnosis Date  . Abnormal EKG 12/09/2018  . Arthritis    psoriatic  . Cancer ([place])    thyroid  . Essential hypertension 12/09/2018  . GERD (gastroesophageal reflux disease)   . Hyperlipidemia   . Hypertension   . 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 Mitral  regurgitation 11/18/2019  . Mitral valve prolapse   . Mixed dyslipidemia 12/09/2018    Past Surgical History:  Procedure Laterality Date  . HERNIA REPAIR Bilateral   . INGUINAL HERNIA REPAIR Left 01/28/2019   Procedure: LEFT INGUINAL HERNIA REPAIR WITH MESH;  Surgeon: Rolm Bookbinder, MD;  Location: [facility];  Service: General;  Laterality: Left;  GENERAL ANESTHESIA AND TAP BLOCK  . KNEE ARTHROSCOPY    . THYROIDECTOMY, PARTIAL    . VARICOSE VEIN SURGERY      Current Medications: Current Meds  Medication Sig  . amLODipine (NORVASC) 5 MG tablet Take 5 mg by mouth daily.  Marland Kitchen aspirin-acetaminophen-caffeine (EXCEDRIN MIGRAINE) 250-250-65 MG tablet Take 1 tablet by mouth every 6 (six) hours as needed for headache.  . b complex vitamins tablet Take 1 tablet by mouth daily.  . Biotin 5 MG CAPS Take 5 mg by mouth daily.  . Calcium Citrate-Vitamin D (CALCIUM + D PO) Take 1 tablet by mouth daily.  . clindamycin (CLEOCIN T) 1 % lotion as needed.   . clobetasol ointment (TEMOVATE) 0.05 % as needed.   Arna Medici 75 MCG tablet Take 75 mcg by mouth every other day.   . Ixekizumab 80 MG/ML SOSY Inject 80 mg into the skin every 30 (thirty) days.   Marland Kitchen ketoconazole (NIZORAL) 2 % shampoo ROTATE WITH OTC SHAMPOOS  . levothyroxine (SYNTHROID) 50 MCG tablet Take 50 mcg by mouth every other day.   . methocarbamol (ROBAXIN) 500 MG tablet  Take 500 mg by mouth every 12 (twelve) hours as needed for muscle pain. for pain  . mupirocin ointment (BACTROBAN) 2 % Apply topically 2 (two) times daily as needed.  Marland Kitchen omeprazole (PRILOSEC) 20 MG capsule Take 20 mg by mouth daily.  . pseudoephedrine (SUDAFED) 30 MG tablet Take 30 mg by mouth daily as needed for congestion.  . rosuvastatin (CRESTOR) 10 MG tablet Take 10 mg by mouth daily.  . vitamin B-12 (CYANOCOBALAMIN) 1000 MCG tablet Take 1,000 mcg by mouth 2 (two) times a week.     Allergies:   Patient has no known allergies.   Social History   Socioeconomic History   . Marital status: Married    Spouse name: Not on file  . Number of children: Not on file  . Years of education: Not on file  . Highest education level: Not on file  Occupational History  . Not on file  Tobacco Use  . Smoking status: Never Smoker  . Smokeless tobacco: Never Used  Vaping Use  . Vaping Use: Never used  Substance and Sexual Activity  . Alcohol use: Yes    Alcohol/week: 7.0 standard drinks    Types: 7 Cans of beer per week  . Drug use: Never  . Sexual activity: Not on file  Other Topics Concern  . Not on file  Social History Narrative  . Not on file   Social Determinants of Health   Financial Resource Strain: Not on file  Food Insecurity: Not on file  Transportation Needs: Not on file  Physical Activity: Not on file  Stress: Not on file  Social Connections: Not on file     Family History: The patient's family history includes Heart Problems in his maternal grandmother; Hypertension in his mother.  ROS:   Please see the history of present illness.    All other systems reviewed and are negative.  EKGs/Labs/Other Studies Reviewed:    The following studies were reviewed today: Echo 06/23/2020: IMPRESSIONS    1. Left ventricular ejection fraction, by estimation, is 60 to 65%. The  left ventricle has normal function. The left ventricle has no regional  wall motion abnormalities. Left ventricular diastolic parameters are  consistent with Grade I diastolic  dysfunction (impaired relaxation). Elevated left ventricular end-diastolic  pressure.  2. Right ventricular systolic function is normal. The right ventricular  size is normal. There is normal pulmonary artery systolic pressure.  3. Left atrial size was severely dilated.  4. Moderate elongation of the mitral anterior and posterior leaflets.  5. The mitral valve is myxomatous. 2 separate moderate to severe jets  mitral valve regurgitation. In summary, there is severe regurgitation by  combined  PISA.  6. The aortic valve is tricuspid. Aortic valve regurgitation is not  visualized.  7. The inferior vena cava is normal in size with greater than 50%  respiratory variability, suggesting right atrial pressure of 3 mmHg.   Comparison(s): 12/16/18 EF 60-65%. Moderate-severe MR. Recommend further  evaluation with TEE and work-up for possible MV repair or replacement.   Stress Echo: IMPRESSIONS    1. Excellent LV contractile reserve.  2. Severe mitral regurgitation at rest and stress (worsened severity  during stress) with increase in estimated pulmonary pressures.  3. 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  4. This is a negative stress echocardiogram for ischemia.  5. At baseline, severe mitral regurgitation was present. During peak  stress, severe mitral regurgitation was present.  6. The baseline PASP was estimated to be  32.1 mmHg. The PASP increased to  45.0 mmHg at peak stress.  7. This is an intermediate risk study.   Recent Labs: 06/13/2020: BUN 13; Creatinine, Ser 0.80; Hemoglobin 12.8; Platelets 159; Potassium 3.2; Sodium 127 06/23/2020: NT-Pro BNP 480  Recent Lipid Panel    Component Value Date/Time   CHOL 145 05/20/2019 1017   TRIG 96 05/20/2019 1017   HDL 46 05/20/2019 1017   CHOLHDL 3.2 05/20/2019 1017   LDLCALC 80 05/20/2019 1017     Risk Assessment/Calculations:       Physical Exam:    VS:  BP (!) 142/76   Pulse 73   Ht 5\' 9"  (1.753 m)   Wt 159 lb 3.2 oz (72.2 kg)   SpO2 99%   BMI 23.51 kg/m     Wt Readings from Last 3 Encounters:  12/12/20 159 lb 3.2 oz (72.2 kg)  11/16/20 156 lb (70.8 kg)  06/23/20 152 lb (68.9 kg)     GEN:  Well nourished, well developed in no acute distress HEENT: Normal NECK: No JVD; No carotid bruits LYMPHATICS: No lymphadenopathy CARDIAC: RRR, 3/6 mid-to-late systolic murmur best heard at the apex RESPIRATORY:  Clear to auscultation without rales, wheezing or rhonchi  ABDOMEN: Soft, non-tender,  non-distended MUSCULOSKELETAL:  No edema; No deformity  SKIN: Warm and dry NEUROLOGIC:  Alert and oriented x 3 PSYCHIATRIC:  Normal affect   ASSESSMENT:    1. Nonrheumatic mitral valve regurgitation    PLAN:    In order of problems listed above:  49. 75 year old male with severe, stage C mitral regurgitation secondary to mitral valve prolapse with evidence of exercise-induced pulmonary hypertension and left atrial enlargement.  I have again discussed consideration of surgical intervention and we contrasted that with ongoing close surveillance and medical therapy.  I think that proceeding with diagnostic right and left heart catheterization as well as transesophageal echo studies would be helpful in determining whether he would have a high chance of mitral valve repair at a low morbidity.  If this were the case, I think it would be reasonable to proceed with surgical intervention.  The patient and his wife were counseled at length.  He would like to delay until the COVID-19 surge is under better control, but at that point he would like to proceed with further evaluation.  I reviewed right and left heart catheterization details with him at length today. I have reviewed the risks, indications, and alternatives to cardiac catheterization, possible angioplasty, and stenting with the patient. Risks include but are not limited to bleeding, infection, vascular injury, stroke, myocardial infection, arrhythmia, kidney injury, radiation-related injury in the case of prolonged fluoroscopy use, emergency cardiac surgery, and death. The patient understands the risks of serious complication is 1-2 in [id number] with diagnostic cardiac cath and 1-2% or less with angioplasty/stenting.  We also discussed indication for transesophageal echocardiogram.  Risks and indications were reviewed.  These procedures will be scheduled on the same date.  All of his questions are answered.  I would anticipate sending him for surgical  evaluation once his studies are completed.   Shared Decision Making/Informed Consent The risks [esophageal damage, perforation (1:10,000 risk), bleeding, pharyngeal hematoma as well as other potential complications associated with conscious sedation including aspiration, arrhythmia, respiratory failure and death], benefits (treatment guidance and diagnostic support) and alternatives of a transesophageal echocardiogram were discussed in detail with Mr. Elm and he is willing to proceed.     Medication Adjustments/Labs and Tests Ordered: Current medicines are reviewed at  length with the patient today.  Concerns regarding medicines are outlined above.  Orders Placed This Encounter  Procedures  . EKG 12-Lead   No orders of the defined types were placed in this encounter.   Patient Instructions  Please see attached instruction sheet for future testing information.  Your appointment with Dr. Geraldo Pitter has been rescheduled to 01/25/2021 at 11:00AM.    Signed, Sherren Mocha, MD  12/15/2020 11:30 AM    [place]

## 2020-12-12 NOTE — Patient Instructions (Signed)
Please see attached instruction sheet for future testing information.  Your appointment with Dr. Tomie China has been rescheduled to 01/25/2021 at 11:00AM.

## 2020-12-13 DIAGNOSIS — H524 Presbyopia: Secondary | ICD-10-CM | POA: Diagnosis not present

## 2020-12-13 DIAGNOSIS — Z961 Presence of intraocular lens: Secondary | ICD-10-CM | POA: Diagnosis not present

## 2020-12-13 DIAGNOSIS — H43393 Other vitreous opacities, bilateral: Secondary | ICD-10-CM | POA: Diagnosis not present

## 2020-12-28 DIAGNOSIS — M05749 Rheumatoid arthritis with rheumatoid factor of unspecified hand without organ or systems involvement: Secondary | ICD-10-CM | POA: Diagnosis not present

## 2020-12-28 DIAGNOSIS — E782 Mixed hyperlipidemia: Secondary | ICD-10-CM | POA: Diagnosis not present

## 2020-12-28 DIAGNOSIS — E871 Hypo-osmolality and hyponatremia: Secondary | ICD-10-CM | POA: Diagnosis not present

## 2020-12-28 DIAGNOSIS — I341 Nonrheumatic mitral (valve) prolapse: Secondary | ICD-10-CM | POA: Diagnosis not present

## 2020-12-28 DIAGNOSIS — I10 Essential (primary) hypertension: Secondary | ICD-10-CM | POA: Diagnosis not present

## 2020-12-28 DIAGNOSIS — E039 Hypothyroidism, unspecified: Secondary | ICD-10-CM | POA: Diagnosis not present

## 2020-12-28 DIAGNOSIS — Z6823 Body mass index (BMI) 23.0-23.9, adult: Secondary | ICD-10-CM | POA: Diagnosis not present

## 2020-12-28 DIAGNOSIS — L4050 Arthropathic psoriasis, unspecified: Secondary | ICD-10-CM | POA: Diagnosis not present

## 2020-12-28 DIAGNOSIS — Z23 Encounter for immunization: Secondary | ICD-10-CM | POA: Diagnosis not present

## 2020-12-28 DIAGNOSIS — K219 Gastro-esophageal reflux disease without esophagitis: Secondary | ICD-10-CM | POA: Diagnosis not present

## 2020-12-28 DIAGNOSIS — Z1211 Encounter for screening for malignant neoplasm of colon: Secondary | ICD-10-CM | POA: Diagnosis not present

## 2020-12-28 DIAGNOSIS — Z125 Encounter for screening for malignant neoplasm of prostate: Secondary | ICD-10-CM | POA: Diagnosis not present

## 2021-01-24 DIAGNOSIS — Z1211 Encounter for screening for malignant neoplasm of colon: Secondary | ICD-10-CM | POA: Diagnosis not present

## 2021-01-25 ENCOUNTER — Ambulatory Visit (INDEPENDENT_AMBULATORY_CARE_PROVIDER_SITE_OTHER): Payer: Medicare Other | Admitting: Cardiology

## 2021-01-25 ENCOUNTER — Other Ambulatory Visit: Payer: Self-pay

## 2021-01-25 ENCOUNTER — Encounter: Payer: Self-pay | Admitting: Cardiology

## 2021-01-25 VITALS — BP 154/80 | HR 76 | Ht 68.0 in | Wt 154.6 lb

## 2021-01-25 DIAGNOSIS — E782 Mixed hyperlipidemia: Secondary | ICD-10-CM | POA: Diagnosis not present

## 2021-01-25 DIAGNOSIS — I10 Essential (primary) hypertension: Secondary | ICD-10-CM | POA: Diagnosis not present

## 2021-01-25 DIAGNOSIS — I051 Rheumatic mitral insufficiency: Secondary | ICD-10-CM

## 2021-01-25 DIAGNOSIS — I341 Nonrheumatic mitral (valve) prolapse: Secondary | ICD-10-CM | POA: Diagnosis not present

## 2021-01-25 NOTE — H&P (View-Only) (Signed)
Cardiology Office Note:    Date:  01/25/2021   ID:  Cristian Nunez, DOB 12/22/1945, MRN [id number]  PCP:  Renaldo Reel, PA  Cardiologist:  Jenean Lindau, MD   Referring MD: Renaldo Reel, PA    ASSESSMENT:    1. Mitral valve prolapse   2. Rheumatic mitral regurgitation   3. Essential hypertension   4. Mixed dyslipidemia    PLAN:    In order of problems listed above:  1. Primary prevention stressed with patient.  Importance of compliance with diet medication stressed any vocalized understanding. 2. Essential hypertension: Blood pressure is elevated but he tells me that he had a high salt diet last night but overall his blood pressure stable and he keeps a track of it.  He mentioned numbers to me and they are acceptable. 3. Mitral valve prolapse with regurgitation: Patient planning invasive evaluation with the possibility of repair/replacement.  I discussed this with him at length and questions were answered to his satisfaction. 4. Mixed dyslipidemia: Diet emphasized.  Lipid numbers reviewed. 5. Will be seen in follow-up appointment after his a forementioned evaluation.   Medication Adjustments/Labs and Tests Ordered: Current medicines are reviewed at length with the patient today.  Concerns regarding medicines are outlined above.  No orders of the defined types were placed in this encounter.  No orders of the defined types were placed in this encounter.    No chief complaint on file.    History of Present Illness:    Cristian Nunez is a 75 y.o. male.  Patient has past medical history of essential hypertension dyslipidemia mitral valve prolapse and significant regurgitation.  Is planning to undergo evaluation with right and left heart catheterization coronary angiography and mitral valve assessment.  This is with the view of intervention on the valve.  Patient denies any problems at this time.  He takes care of activities of daily living.  No chest pain orthopnea or PND.   At the time of my evaluation, the patient is alert awake oriented and in no distress.  He leads a sedentary lifestyle at this time in view of his condition.  He is an active gentleman overall.  Past Medical History:  Diagnosis Date  . Abnormal EKG 12/09/2018  . Arthritis    psoriatic  . Cancer ([place])    thyroid  . Essential hypertension 12/09/2018  . GERD (gastroesophageal reflux disease)   . Hyperlipidemia   . Hypertension   . 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 Mitral regurgitation 11/18/2019  . Mitral valve prolapse   . Mixed dyslipidemia 12/09/2018    Past Surgical History:  Procedure Laterality Date  . HERNIA REPAIR Bilateral   . INGUINAL HERNIA REPAIR Left 01/28/2019   Procedure: LEFT INGUINAL HERNIA REPAIR WITH MESH;  Surgeon: Rolm Bookbinder, MD;  Location: [facility];  Service: General;  Laterality: Left;  GENERAL ANESTHESIA AND TAP BLOCK  . KNEE ARTHROSCOPY    . THYROIDECTOMY, PARTIAL    . VARICOSE VEIN SURGERY      Current Medications: Current Meds  Medication Sig  . amLODipine (NORVASC) 5 MG tablet Take 5 mg by mouth daily.  Marland Kitchen aspirin-acetaminophen-caffeine (EXCEDRIN MIGRAINE) 250-250-65 MG tablet Take 1 tablet by mouth every 6 (six) hours as needed for headache.  . b complex vitamins tablet Take 1 tablet by mouth daily.  . Biotin 5 MG CAPS Take 5 mg by mouth daily.  . Calcium Citrate-Vitamin D (CALCIUM + D PO) Take 1 tablet  by mouth 3 (three) times a week.  . clindamycin (CLEOCIN T) 1 % lotion Apply 1 application topically daily as needed (irritation).  . clobetasol ointment (TEMOVATE) 5.00 % Apply 1 application topically 2 (two) times daily as needed (irritation).  Arna Medici 75 MCG tablet Take 75 mcg by mouth every other day. Alternates with 50 mcg every other day  . Ixekizumab 80 MG/ML SOSY Inject 80 mg into the skin every 30 (thirty) days.   Marland Kitchen ketoconazole (NIZORAL) 2 % shampoo Apply 1 application topically every 30 (thirty) days.  Marland Kitchen levothyroxine  (EUTHYROX) 50 MCG tablet Take 50 mcg by mouth every other day.  . methocarbamol (ROBAXIN) 500 MG tablet Take 500 mg by mouth every 12 (twelve) hours as needed for muscle pain. for pain  . omeprazole (PRILOSEC) 20 MG capsule Take 20 mg by mouth daily.  . pseudoephedrine (SUDAFED) 30 MG tablet Take 30 mg by mouth daily as needed for congestion.  . rosuvastatin (CRESTOR) 10 MG tablet Take 10 mg by mouth daily.  . vitamin B-12 (CYANOCOBALAMIN) 1000 MCG tablet Take 1,000 mcg by mouth 2 (two) times a week.     Allergies:   Patient has no known allergies.   Social History   Socioeconomic History  . Marital status: Married    Spouse name: Not on file  . Number of children: Not on file  . Years of education: Not on file  . Highest education level: Not on file  Occupational History  . Not on file  Tobacco Use  . Smoking status: Never Smoker  . Smokeless tobacco: Never Used  Vaping Use  . Vaping Use: Never used  Substance and Sexual Activity  . Alcohol use: Yes    Alcohol/week: 7.0 standard drinks    Types: 7 Cans of beer per week  . Drug use: Never  . Sexual activity: Not on file  Other Topics Concern  . Not on file  Social History Narrative  . Not on file   Social Determinants of Health   Financial Resource Strain: Not on file  Food Insecurity: Not on file  Transportation Needs: Not on file  Physical Activity: Not on file  Stress: Not on file  Social Connections: Not on file     Family History: The patient's family history includes Heart Problems in his maternal grandmother; Hypertension in his mother.  ROS:   Please see the history of present illness.    All other systems reviewed and are negative.  EKGs/Labs/Other Studies Reviewed:    The following studies were reviewed today: EKG reveals sinus rhythm PACs and nonspecific ST-T changes. I discussed my findings with the patient at length   Recent Labs: 06/13/2020: BUN 13; Creatinine, Ser 0.80; Hemoglobin 12.8;  Platelets 159; Potassium 3.2; Sodium 127 06/23/2020: NT-Pro BNP 480  Recent Lipid Panel    Component Value Date/Time   CHOL 145 05/20/2019 1017   TRIG 96 05/20/2019 1017   HDL 46 05/20/2019 1017   CHOLHDL 3.2 05/20/2019 1017   LDLCALC 80 05/20/2019 1017    Physical Exam:    VS:  BP (!) 154/80   Pulse 76   Ht 5\' 8"  (1.727 m)   Wt 154 lb 9.6 oz (70.1 kg)   SpO2 99%   BMI 23.51 kg/m     Wt Readings from Last 3 Encounters:  01/25/21 154 lb 9.6 oz (70.1 kg)  12/12/20 159 lb 3.2 oz (72.2 kg)  11/16/20 156 lb (70.8 kg)     GEN: Patient is  in no acute distress HEENT: Normal NECK: No JVD; No carotid bruits LYMPHATICS: No lymphadenopathy CARDIAC: Hear sounds regular, 2/6 systolic murmur at the apex. RESPIRATORY:  Clear to auscultation without rales, wheezing or rhonchi  ABDOMEN: Soft, non-tender, non-distended MUSCULOSKELETAL:  No edema; No deformity  SKIN: Warm and dry NEUROLOGIC:  Alert and oriented x 3 PSYCHIATRIC:  Normal affect   Signed, Jenean Lindau, MD  01/25/2021 11:23 AM    [place]

## 2021-01-25 NOTE — Progress Notes (Signed)
Cardiology Office Note:    Date:  01/25/2021   ID:  [name], DOB 05/19/1946, MRN [id number]  PCP:  Renaldo Reel, PA  Cardiologist:  Jenean Lindau, MD   Referring MD: Renaldo Reel, PA    ASSESSMENT:    1. Mitral valve prolapse   2. Rheumatic mitral regurgitation   3. Essential hypertension   4. Mixed dyslipidemia    PLAN:    In order of problems listed above:  1. Primary prevention stressed with patient.  Importance of compliance with diet medication stressed any vocalized understanding. 2. Essential hypertension: Blood pressure is elevated but he tells me that he had a high salt diet last night but overall his blood pressure stable and he keeps a track of it.  He mentioned numbers to me and they are acceptable. 3. Mitral valve prolapse with regurgitation: Patient planning invasive evaluation with the possibility of repair/replacement.  I discussed this with him at length and questions were answered to his satisfaction. 4. Mixed dyslipidemia: Diet emphasized.  Lipid numbers reviewed. 5. Will be seen in follow-up appointment after his a forementioned evaluation.   Medication Adjustments/Labs and Tests Ordered: Current medicines are reviewed at length with the patient today.  Concerns regarding medicines are outlined above.  No orders of the defined types were placed in this encounter.  No orders of the defined types were placed in this encounter.    No chief complaint on file.    History of Present Illness:    Cristian Nunez is a 75 y.o. male.  Patient has past medical history of essential hypertension dyslipidemia mitral valve prolapse and significant regurgitation.  Is planning to undergo evaluation with right and left heart catheterization coronary angiography and mitral valve assessment.  This is with the view of intervention on the valve.  Patient denies any problems at this time.  He takes care of activities of daily living.  No chest pain orthopnea or PND.   At the time of my evaluation, the patient is alert awake oriented and in no distress.  He leads a sedentary lifestyle at this time in view of his condition.  He is an active gentleman overall.  Past Medical History:  Diagnosis Date  . Abnormal EKG 12/09/2018  . Arthritis    psoriatic  . Cancer ([place])    thyroid  . Essential hypertension 12/09/2018  . GERD (gastroesophageal reflux disease)   . Hyperlipidemia   . Hypertension   . 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 Mitral regurgitation 11/18/2019  . Mitral valve prolapse   . Mixed dyslipidemia 12/09/2018    Past Surgical History:  Procedure Laterality Date  . HERNIA REPAIR Bilateral   . INGUINAL HERNIA REPAIR Left 01/28/2019   Procedure: LEFT INGUINAL HERNIA REPAIR WITH MESH;  Surgeon: Rolm Bookbinder, MD;  Location: [facility];  Service: General;  Laterality: Left;  GENERAL ANESTHESIA AND TAP BLOCK  . KNEE ARTHROSCOPY    . THYROIDECTOMY, PARTIAL    . VARICOSE VEIN SURGERY      Current Medications: Current Meds  Medication Sig  . amLODipine (NORVASC) 5 MG tablet Take 5 mg by mouth daily.  Marland Kitchen aspirin-acetaminophen-caffeine (EXCEDRIN MIGRAINE) 250-250-65 MG tablet Take 1 tablet by mouth every 6 (six) hours as needed for headache.  . b complex vitamins tablet Take 1 tablet by mouth daily.  . Biotin 5 MG CAPS Take 5 mg by mouth daily.  . Calcium Citrate-Vitamin D (CALCIUM + D PO) Take 1 tablet  by mouth 3 (three) times a week.  . clindamycin (CLEOCIN T) 1 % lotion Apply 1 application topically daily as needed (irritation).  . clobetasol ointment (TEMOVATE) 3.55 % Apply 1 application topically 2 (two) times daily as needed (irritation).  Arna Medici 75 MCG tablet Take 75 mcg by mouth every other day. Alternates with 50 mcg every other day  . Ixekizumab 80 MG/ML SOSY Inject 80 mg into the skin every 30 (thirty) days.   Marland Kitchen ketoconazole (NIZORAL) 2 % shampoo Apply 1 application topically every 30 (thirty) days.  Marland Kitchen levothyroxine  (EUTHYROX) 50 MCG tablet Take 50 mcg by mouth every other day.  . methocarbamol (ROBAXIN) 500 MG tablet Take 500 mg by mouth every 12 (twelve) hours as needed for muscle pain. for pain  . omeprazole (PRILOSEC) 20 MG capsule Take 20 mg by mouth daily.  . pseudoephedrine (SUDAFED) 30 MG tablet Take 30 mg by mouth daily as needed for congestion.  . rosuvastatin (CRESTOR) 10 MG tablet Take 10 mg by mouth daily.  . vitamin B-12 (CYANOCOBALAMIN) 1000 MCG tablet Take 1,000 mcg by mouth 2 (two) times a week.     Allergies:   Patient has no known allergies.   Social History   Socioeconomic History  . Marital status: Married    Spouse name: Not on file  . Number of children: Not on file  . Years of education: Not on file  . Highest education level: Not on file  Occupational History  . Not on file  Tobacco Use  . Smoking status: Never Smoker  . Smokeless tobacco: Never Used  Vaping Use  . Vaping Use: Never used  Substance and Sexual Activity  . Alcohol use: Yes    Alcohol/week: 7.0 standard drinks    Types: 7 Cans of beer per week  . Drug use: Never  . Sexual activity: Not on file  Other Topics Concern  . Not on file  Social History Narrative  . Not on file   Social Determinants of Health   Financial Resource Strain: Not on file  Food Insecurity: Not on file  Transportation Needs: Not on file  Physical Activity: Not on file  Stress: Not on file  Social Connections: Not on file     Family History: The patient's family history includes Heart Problems in his maternal grandmother; Hypertension in his mother.  ROS:   Please see the history of present illness.    All other systems reviewed and are negative.  EKGs/Labs/Other Studies Reviewed:    The following studies were reviewed today: EKG reveals sinus rhythm PACs and nonspecific ST-T changes. I discussed my findings with the patient at length   Recent Labs: 06/13/2020: BUN 13; Creatinine, Ser 0.80; Hemoglobin 12.8;  Platelets 159; Potassium 3.2; Sodium 127 06/23/2020: NT-Pro BNP 480  Recent Lipid Panel    Component Value Date/Time   CHOL 145 05/20/2019 1017   TRIG 96 05/20/2019 1017   HDL 46 05/20/2019 1017   CHOLHDL 3.2 05/20/2019 1017   LDLCALC 80 05/20/2019 1017    Physical Exam:    VS:  BP (!) 154/80   Pulse 76   Ht 5\' 8"  (1.727 m)   Wt 154 lb 9.6 oz (70.1 kg)   SpO2 99%   BMI 23.51 kg/m     Wt Readings from Last 3 Encounters:  01/25/21 154 lb 9.6 oz (70.1 kg)  12/12/20 159 lb 3.2 oz (72.2 kg)  11/16/20 156 lb (70.8 kg)     GEN: Patient is  in no acute distress HEENT: Normal NECK: No JVD; No carotid bruits LYMPHATICS: No lymphadenopathy CARDIAC: Hear sounds regular, 2/6 systolic murmur at the apex. RESPIRATORY:  Clear to auscultation without rales, wheezing or rhonchi  ABDOMEN: Soft, non-tender, non-distended MUSCULOSKELETAL:  No edema; No deformity  SKIN: Warm and dry NEUROLOGIC:  Alert and oriented x 3 PSYCHIATRIC:  Normal affect   Signed, Jenean Lindau, MD  01/25/2021 11:23 AM    [place]

## 2021-01-25 NOTE — Patient Instructions (Addendum)
Medication Instructions:  No medication changes. *If you need a refill on your cardiac medications before your next appointment, please call your pharmacy*   Lab Work: Your physician recommends that you have a CBC and BMET done today for your upcoming procedure.  If you have labs (blood work) drawn today and your tests are completely normal, you will receive your results only by: Marland Kitchen MyChart Message (if you have MyChart) OR . A paper copy in the mail If you have any lab test that is abnormal or we need to change your treatment, we will call you to review the results.   Testing/Procedures: None ordered   Follow-Up: At [facility], you and your health needs are our priority.  As part of our continuing mission to provide you with exceptional heart care, we have created designated Provider Care Teams.  These Care Teams include your primary Cardiologist (physician) and Advanced Practice Providers (APPs -  Physician Assistants and Nurse Practitioners) who all work together to provide you with the care you need, when you need it.  We recommend signing up for the patient portal called "MyChart".  Sign up information is provided on this After Visit Summary.  MyChart is used to connect with patients for Virtual Visits (Telemedicine).  Patients are able to view lab/test results, encounter notes, upcoming appointments, etc.  Non-urgent messages can be sent to your provider as well.   To learn more about what you can do with MyChart, go to [email or web address].    Your next appointment:   3 month(s)  The format for your next appointment:   In Person  Provider:   Jyl Heinz, MD   Other Instructions NA

## 2021-01-26 LAB — CBC WITH DIFFERENTIAL/PLATELET
Basophils Absolute: 0 x10E3/uL (ref 0.0–0.2)
Basos: 1 %
EOS (ABSOLUTE): 0.2 x10E3/uL (ref 0.0–0.4)
Eos: 5 %
Hematocrit: 45 % (ref 37.5–51.0)
Hemoglobin: 14.8 g/dL (ref 13.0–17.7)
Immature Grans (Abs): 0 x10E3/uL (ref 0.0–0.1)
Immature Granulocytes: 0 %
Lymphocytes Absolute: 1.1 x10E3/uL (ref 0.7–3.1)
Lymphs: 29 %
MCH: 30.8 pg (ref 26.6–33.0)
MCHC: 32.9 g/dL (ref 31.5–35.7)
MCV: 94 fL (ref 79–97)
Monocytes Absolute: 0.6 x10E3/uL (ref 0.1–0.9)
Monocytes: 16 %
Neutrophils Absolute: 1.8 x10E3/uL (ref 1.4–7.0)
Neutrophils: 49 %
Platelets: 182 x10E3/uL (ref 150–450)
RBC: 4.8 x10E6/uL (ref 4.14–5.80)
RDW: 12 % (ref 11.6–15.4)
WBC: 3.7 x10E3/uL (ref 3.4–10.8)

## 2021-01-26 LAB — BASIC METABOLIC PANEL WITH GFR
BUN/Creatinine Ratio: 15 (ref 10–24)
BUN: 13 mg/dL (ref 8–27)
CO2: 25 mmol/L (ref 20–29)
Calcium: 9.7 mg/dL (ref 8.6–10.2)
Chloride: 96 mmol/L (ref 96–106)
Creatinine, Ser: 0.88 mg/dL (ref 0.76–1.27)
GFR calc Af Amer: 96 mL/min/1.73
GFR calc non Af Amer: 83 mL/min/1.73
Glucose: 99 mg/dL (ref 65–99)
Potassium: 4.6 mmol/L (ref 3.5–5.2)
Sodium: 136 mmol/L (ref 134–144)

## 2021-02-01 ENCOUNTER — Other Ambulatory Visit (HOSPITAL_COMMUNITY)
Admission: RE | Admit: 2021-02-01 | Discharge: 2021-02-01 | Disposition: A | Discharge status: Home / Self Care | Payer: Medicare Other | Source: Ambulatory Visit | Attending: Cardiovascular Disease | Admitting: Cardiovascular Disease

## 2021-02-01 DIAGNOSIS — Z01812 Encounter for preprocedural laboratory examination: Secondary | ICD-10-CM | POA: Diagnosis not present

## 2021-02-01 DIAGNOSIS — Z20822 Contact with and (suspected) exposure to covid-19: Secondary | ICD-10-CM | POA: Diagnosis not present

## 2021-02-01 LAB — SARS CORONAVIRUS 2 (TAT 6-24 HRS): SARS Coronavirus 2: NEGATIVE

## 2021-02-02 ENCOUNTER — Telehealth: Payer: Self-pay | Admitting: *Deleted

## 2021-02-02 NOTE — Telephone Encounter (Signed)
Pt contacted pre-catheterization scheduled at [facility] for: Friday February 03, 2021 10:30 AM/TEE 9 AM Verified arrival time and place: [facility]) at: 8 AM   Nothing to eat or drink after midnight, may have sips of water to take medications.  AM meds can be  taken pre-cath with sips of water including: ASA 81 mg   Confirmed patient has responsible adult to drive home post procedure and be with patient first 24 hours after arriving home: yes  You are allowed ONE visitor in the waiting room during the time you are at the hospital for your procedure. Both you and your visitor must wear a mask once you enter the hospital.    Reviewed procedure /mask/visitor instructions with patient.

## 2021-02-03 ENCOUNTER — Encounter (HOSPITAL_COMMUNITY): Payer: Self-pay | Admitting: Cardiovascular Disease

## 2021-02-03 ENCOUNTER — Ambulatory Visit (HOSPITAL_COMMUNITY): Admission: RE | Admit: 2021-02-03 | Payer: Medicare Other | Source: Ambulatory Visit | Admitting: Cardiovascular Disease

## 2021-02-03 ENCOUNTER — Other Ambulatory Visit: Payer: Self-pay

## 2021-02-03 ENCOUNTER — Ambulatory Visit (HOSPITAL_COMMUNITY)
Admission: RE | Admit: 2021-02-03 | Discharge: 2021-02-03 | Disposition: A | Discharge status: Home / Self Care | Payer: Medicare Other | Attending: Cardiovascular Disease | Admitting: Cardiovascular Disease

## 2021-02-03 ENCOUNTER — Ambulatory Visit (HOSPITAL_COMMUNITY): Payer: Medicare Other | Admitting: Anesthesiology

## 2021-02-03 ENCOUNTER — Encounter (HOSPITAL_COMMUNITY)
Admission: RE | Disposition: A | Discharge status: Home / Self Care | Payer: Self-pay | Source: Home / Self Care | Attending: Cardiovascular Disease

## 2021-02-03 DIAGNOSIS — I10 Essential (primary) hypertension: Secondary | ICD-10-CM | POA: Insufficient documentation

## 2021-02-03 DIAGNOSIS — I2510 Atherosclerotic heart disease of native coronary artery without angina pectoris: Secondary | ICD-10-CM | POA: Insufficient documentation

## 2021-02-03 DIAGNOSIS — Z79890 Hormone replacement therapy (postmenopausal): Secondary | ICD-10-CM | POA: Diagnosis not present

## 2021-02-03 DIAGNOSIS — I080 Rheumatic disorders of both mitral and aortic valves: Secondary | ICD-10-CM | POA: Diagnosis not present

## 2021-02-03 DIAGNOSIS — I340 Nonrheumatic mitral (valve) insufficiency: Secondary | ICD-10-CM

## 2021-02-03 DIAGNOSIS — E782 Mixed hyperlipidemia: Secondary | ICD-10-CM | POA: Diagnosis not present

## 2021-02-03 DIAGNOSIS — Z79899 Other long term (current) drug therapy: Secondary | ICD-10-CM | POA: Diagnosis not present

## 2021-02-03 HISTORY — PX: RIGHT/LEFT HEART CATH AND CORONARY ANGIOGRAPHY: CATH118266

## 2021-02-03 LAB — POCT I-STAT EG7
Acid-Base Excess: 2 mmol/L (ref 0.0–2.0)
Acid-Base Excess: 2 mmol/L (ref 0.0–2.0)
Acid-Base Excess: 4 mmol/L — ABNORMAL HIGH (ref 0.0–2.0)
Bicarbonate: 28.1 mmol/L — ABNORMAL HIGH (ref 20.0–28.0)
Bicarbonate: 28.9 mmol/L — ABNORMAL HIGH (ref 20.0–28.0)
Bicarbonate: 30.9 mmol/L — ABNORMAL HIGH (ref 20.0–28.0)
Calcium, Ion: 1.06 mmol/L — ABNORMAL LOW (ref 1.15–1.40)
Calcium, Ion: 1.12 mmol/L — ABNORMAL LOW (ref 1.15–1.40)
Calcium, Ion: 1.18 mmol/L (ref 1.15–1.40)
HCT: 42 % (ref 39.0–52.0)
HCT: 44 % (ref 39.0–52.0)
HCT: 44 % (ref 39.0–52.0)
Hemoglobin: 14.3 g/dL (ref 13.0–17.0)
Hemoglobin: 15 g/dL (ref 13.0–17.0)
Hemoglobin: 15 g/dL (ref 13.0–17.0)
O2 Saturation: 80 %
O2 Saturation: 81 %
O2 Saturation: 98 %
Potassium: 3.3 mmol/L — ABNORMAL LOW (ref 3.5–5.1)
Potassium: 3.6 mmol/L (ref 3.5–5.1)
Potassium: 3.6 mmol/L (ref 3.5–5.1)
Sodium: 135 mmol/L (ref 135–145)
Sodium: 135 mmol/L (ref 135–145)
Sodium: 139 mmol/L (ref 135–145)
TCO2: 30 mmol/L (ref 22–32)
TCO2: 30 mmol/L (ref 22–32)
TCO2: 33 mmol/L — ABNORMAL HIGH (ref 22–32)
pCO2, Ven: 49.1 mmHg (ref 44.0–60.0)
pCO2, Ven: 51.6 mmHg (ref 44.0–60.0)
pCO2, Ven: 54.5 mmHg (ref 44.0–60.0)
pH, Ven: 7.356 (ref 7.250–7.430)
pH, Ven: 7.361 (ref 7.250–7.430)
pH, Ven: 7.366 (ref 7.250–7.430)
pO2, Ven: 116 mmHg — ABNORMAL HIGH (ref 32.0–45.0)
pO2, Ven: 47 mmHg — ABNORMAL HIGH (ref 32.0–45.0)
pO2, Ven: 48 mmHg — ABNORMAL HIGH (ref 32.0–45.0)

## 2021-02-03 SURGERY — INVASIVE LAB ABORTED CASE
Anesthesia: General

## 2021-02-03 SURGERY — RIGHT/LEFT HEART CATH AND CORONARY ANGIOGRAPHY
Anesthesia: LOCAL

## 2021-02-03 MED ORDER — ONDANSETRON HCL 4 MG/2ML IJ SOLN: 4.0000 mg | Freq: Four times a day (QID) | INTRAMUSCULAR | Status: DC | PRN

## 2021-02-03 MED ORDER — BUTAMBEN-TETRACAINE-BENZOCAINE 2-2-14 % EX AERO
INHALATION_SPRAY | CUTANEOUS | Status: DC | PRN
  Administered 2021-02-03: 2 via TOPICAL

## 2021-02-03 MED ORDER — LACTATED RINGERS IV SOLN: INTRAVENOUS | Status: DC | PRN

## 2021-02-03 MED ORDER — SODIUM CHLORIDE 0.9% FLUSH: 3.0000 mL | Freq: Two times a day (BID) | INTRAVENOUS | Status: DC

## 2021-02-03 MED ORDER — VERAPAMIL HCL 2.5 MG/ML IV SOLN
INTRAVENOUS | Status: AC
  Filled 2021-02-03: qty 2

## 2021-02-03 MED ORDER — SODIUM CHLORIDE 0.9 % WEIGHT BASED INFUSION: 1.0000 mL/kg/h | INTRAVENOUS | Status: DC

## 2021-02-03 MED ORDER — SODIUM CHLORIDE 0.9 % IV SOLN: 250.0000 mL | INTRAVENOUS | Status: DC | PRN

## 2021-02-03 MED ORDER — LIDOCAINE HCL (PF) 1 % IJ SOLN
INTRAMUSCULAR | Status: AC
  Filled 2021-02-03: qty 30

## 2021-02-03 MED ORDER — SODIUM CHLORIDE 0.9 % WEIGHT BASED INFUSION: 3.0000 mL/kg/h | INTRAVENOUS | Status: AC

## 2021-02-03 MED ORDER — ROCURONIUM BROMIDE 10 MG/ML (PF) SYRINGE
PREFILLED_SYRINGE | INTRAVENOUS | Status: DC | PRN
  Administered 2021-02-03: 50 mg via INTRAVENOUS

## 2021-02-03 MED ORDER — VERAPAMIL HCL 2.5 MG/ML IV SOLN
INTRAVENOUS | Status: DC | PRN
  Administered 2021-02-03: 10 mL via INTRA_ARTERIAL

## 2021-02-03 MED ORDER — FENTANYL CITRATE (PF) 100 MCG/2ML IJ SOLN
INTRAMUSCULAR | Status: AC
  Filled 2021-02-03: qty 2

## 2021-02-03 MED ORDER — HEPARIN SODIUM (PORCINE) 1000 UNIT/ML IJ SOLN
INTRAMUSCULAR | Status: AC
  Filled 2021-02-03: qty 1

## 2021-02-03 MED ORDER — ONDANSETRON HCL 4 MG/2ML IJ SOLN
INTRAMUSCULAR | Status: DC | PRN
  Administered 2021-02-03: 4 mg via INTRAVENOUS

## 2021-02-03 MED ORDER — HYDRALAZINE HCL 20 MG/ML IJ SOLN: 10.0000 mg | INTRAMUSCULAR | Status: DC | PRN

## 2021-02-03 MED ORDER — PROPOFOL 10 MG/ML IV BOLUS
INTRAVENOUS | Status: DC | PRN
  Administered 2021-02-03: 100 mg via INTRAVENOUS
  Administered 2021-02-03: 25 mg via INTRAVENOUS
  Administered 2021-02-03: 20 mg via INTRAVENOUS

## 2021-02-03 MED ORDER — FENTANYL CITRATE (PF) 100 MCG/2ML IJ SOLN
INTRAMUSCULAR | Status: DC | PRN
  Administered 2021-02-03: 25 ug via INTRAVENOUS

## 2021-02-03 MED ORDER — LIDOCAINE HCL (PF) 1 % IJ SOLN
INTRAMUSCULAR | Status: DC | PRN
  Administered 2021-02-03 (×2): 2 mL via INTRADERMAL

## 2021-02-03 MED ORDER — LABETALOL HCL 5 MG/ML IV SOLN: 10.0000 mg | INTRAVENOUS | Status: DC | PRN

## 2021-02-03 MED ORDER — ACETAMINOPHEN 325 MG PO TABS: 650.0000 mg | ORAL_TABLET | ORAL | Status: DC | PRN

## 2021-02-03 MED ORDER — SUGAMMADEX SODIUM 200 MG/2ML IV SOLN
INTRAVENOUS | Status: DC | PRN
  Administered 2021-02-03: 400 mg via INTRAVENOUS

## 2021-02-03 MED ORDER — SODIUM CHLORIDE 0.9% FLUSH: 3.0000 mL | INTRAVENOUS | Status: DC | PRN

## 2021-02-03 MED ORDER — ASPIRIN 81 MG PO CHEW: 81.0000 mg | CHEWABLE_TABLET | ORAL | Status: DC

## 2021-02-03 MED ORDER — HEPARIN (PORCINE) IN NACL 1000-0.9 UT/500ML-% IV SOLN
INTRAVENOUS | Status: AC
  Filled 2021-02-03: qty 1000

## 2021-02-03 MED ORDER — SODIUM CHLORIDE 0.9 % IV SOLN: INTRAVENOUS | Status: DC

## 2021-02-03 MED ORDER — HEPARIN SODIUM (PORCINE) 1000 UNIT/ML IJ SOLN
INTRAMUSCULAR | Status: DC | PRN
  Administered 2021-02-03: 4000 [IU] via INTRAVENOUS

## 2021-02-03 MED ORDER — HEPARIN (PORCINE) IN NACL 1000-0.9 UT/500ML-% IV SOLN
INTRAVENOUS | Status: DC | PRN
  Administered 2021-02-03 (×2): 500 mL

## 2021-02-03 MED ORDER — MIDAZOLAM HCL 2 MG/2ML IJ SOLN
INTRAMUSCULAR | Status: AC
  Filled 2021-02-03: qty 2

## 2021-02-03 MED ORDER — LIDOCAINE 2% (20 MG/ML) 5 ML SYRINGE
INTRAMUSCULAR | Status: DC | PRN
  Administered 2021-02-03: 40 mg via INTRAVENOUS

## 2021-02-03 MED ORDER — IOHEXOL 350 MG/ML SOLN
INTRAVENOUS | Status: DC | PRN
  Administered 2021-02-03: 50 mL

## 2021-02-03 MED ORDER — MIDAZOLAM HCL 2 MG/2ML IJ SOLN
INTRAMUSCULAR | Status: DC | PRN
  Administered 2021-02-03: 2 mg via INTRAVENOUS

## 2021-02-03 MED ORDER — PROPOFOL 500 MG/50ML IV EMUL
INTRAVENOUS | Status: DC | PRN
  Administered 2021-02-03: 100 ug/kg/min via INTRAVENOUS

## 2021-02-03 SURGICAL SUPPLY — 11 items

## 2021-02-03 NOTE — Anesthesia Preprocedure Evaluation (Signed)
Anesthesia Evaluation  Patient identified by MRN, date of birth, ID band Patient awake    Reviewed: Allergy & Precautions, H&P , NPO status , Patient's Chart, lab work & pertinent test results  Airway Mallampati: II   Neck ROM: full    Dental   Pulmonary neg pulmonary ROS,    breath sounds clear to auscultation       Cardiovascular hypertension, + Valvular Problems/Murmurs MR and MVP  Rhythm:regular Rate:Normal     Neuro/Psych    GI/Hepatic GERD  ,  Endo/Other  Hypothyroidism   Renal/GU      Musculoskeletal  (+) Arthritis ,   Abdominal   Peds  Hematology   Anesthesia Other Findings   Reproductive/Obstetrics                             Anesthesia Physical Anesthesia Plan  ASA: III  Anesthesia Plan: MAC   Post-op Pain Management:    Induction: Intravenous  PONV Risk Score and Plan: 1 and Propofol infusion and Treatment may vary due to age or medical condition  Airway Management Planned: Nasal Cannula  Additional Equipment:   Intra-op Plan:   Post-operative Plan:   Informed Consent: I have reviewed the patients History and Physical, chart, labs and discussed the procedure including the risks, benefits and alternatives for the proposed anesthesia with the patient or authorized representative who has indicated his/her understanding and acceptance.     Dental advisory given  Plan Discussed with: CRNA, Anesthesiologist and Surgeon  Anesthesia Plan Comments:         Anesthesia Quick Evaluation

## 2021-02-03 NOTE — Anesthesia Procedure Notes (Signed)
Procedure Name: Intubation Date/Time: 02/03/2021 9:18 AM Performed by: Renato Shin, CRNA Pre-anesthesia Checklist: Patient identified, Emergency Drugs available, Suction available and Patient being monitored Patient Re-evaluated:Patient Re-evaluated prior to induction Oxygen Delivery Method: Circle system utilized Preoxygenation: Pre-oxygenation with 100% oxygen Induction Type: IV induction Ventilation: Mask ventilation without difficulty and Oral airway inserted - appropriate to patient size Laryngoscope Size: Glidescope and 4 Grade View: Grade I Tube type: Oral Tube size: 7.5 mm Number of attempts: 1 Airway Equipment and Method: Oral airway,  Rigid stylet and Video-laryngoscopy Placement Confirmation: ETT inserted through vocal cords under direct vision,  positive ETCO2 and breath sounds checked- equal and bilateral Secured at: 21 cm Tube secured with: Tape Dental Injury: Teeth and Oropharynx as per pre-operative assessment

## 2021-02-03 NOTE — CV Procedure (Signed)
TEE: Attempted : Unable to pass probe Patient has history of neck radiation for tonsils  Anesthesia: Dr Marcie Bal assisted Patient intubated and anesthetized  After intubation direct visualization with glide scope attempted And still not able to pass probe After attempts no evidence of trauma  Discussed with Dr Burt Knack and Roxy Manns Family wife and daughter updated  Would do barium swallow to further assess anatomy prior To MV surgery and have anesthesia preoperative consultation ? Use of nasal fiberoptic visualization and pediatric scope use  Jenkins Rouge MD [facility]

## 2021-02-03 NOTE — Interval H&P Note (Signed)
History and Physical Interval Note:  02/03/2021 7:49 AM  Cristian Nunez  has presented today for surgery, with the diagnosis of MITRAL REGURGITATION.  The various methods of treatment have been discussed with the patient and family. After consideration of risks, benefits and other options for treatment, the patient has consented to  Procedure(s): TRANSESOPHAGEAL ECHOCARDIOGRAM (TEE) (N/A) as a surgical intervention.  The patient's history has been reviewed, patient examined, no change in status, stable for surgery.  I have reviewed the patient's chart and labs.  Questions were answered to the patient's satisfaction.     Jenkins Rouge

## 2021-02-03 NOTE — Transfer of Care (Signed)
Immediate Anesthesia Transfer of Care Note  Patient: Cristian Nunez  Procedure(s) Performed: INVASIVE LAB ABORTED CASE  Patient Location: Endoscopy Unit  Anesthesia Type:General  Level of Consciousness: awake and patient cooperative  Airway & Oxygen Therapy: Patient Spontanous Breathing and Patient connected to face mask oxygen  Post-op Assessment: Report given to RN and Post -op Vital signs reviewed and stable  Post vital signs: Reviewed and stable  Last Vitals:  Vitals Value Taken Time  BP 161/77 02/03/21 0943  Temp    Pulse 72 02/03/21 0952  Resp 14 02/03/21 0952  SpO2 100 % 02/03/21 0952  Vitals shown include unvalidated device data.  Last Pain:  Vitals:   02/03/21 0943  TempSrc:   PainSc: Asleep         Complications: No complications documented.

## 2021-02-03 NOTE — Discharge Instructions (Signed)
Radial Site Care ELEVATE RIGHT WRIST 24 HOURS   This sheet gives you information about how to care for yourself after your procedure. Your health care provider may also give you more specific instructions. If you have problems or questions, contact your health care provider. What can I expect after the procedure? After the procedure, it is common to have:  Bruising and tenderness at the catheter insertion area. Follow these instructions at home: Medicines  Take over-the-counter and prescription medicines only as told by your health care provider. Insertion site care  Follow instructions from your health care provider about how to take care of your insertion site. Make sure you: ? Wash your hands with soap and water before you change your bandage (dressing). If soap and water are not available, use hand sanitizer. ? Change your dressing as told by your health care provider. ? Leave stitches (sutures), skin glue, or adhesive strips in place. These skin closures may need to stay in place for 2 weeks or longer. If adhesive strip edges start to loosen and curl up, you may trim the loose edges. Do not remove adhesive strips completely unless your health care provider tells you to do that.  Check your insertion site every day for signs of infection. Check for: ? Redness, swelling, or pain. ? Fluid or blood. ? Pus or a bad smell. ? Warmth.  Do not take baths, swim, or use a hot tub until your health care provider approves.  You may shower 24-48 hours after the procedure, or as directed by your health care provider. ? Remove the dressing and gently wash the site with plain soap and water. ? Pat the area dry with a clean towel. ? Do not rub the site. That could cause bleeding.  Do not apply powder or lotion to the site. Activity  For 24 hours after the procedure, or as directed by your health care provider: ? Do not flex or bend the affected arm. ? Do not push or pull heavy objects with  the affected arm. ? Do not drive yourself home from the hospital or clinic. You may drive 24 hours after the procedure unless your health care provider tells you not to. ? Do not operate machinery or power tools.  Do not lift anything that is heavier than 10 lb (4.5 kg), or the limit that you are told, until your health care provider says that it is safe.  Ask your health care provider when it is okay to: ? Return to work or school. ? Resume usual physical activities or sports. ? Resume sexual activity.   General instructions  If the catheter site starts to bleed, raise your arm and put firm pressure on the site. If the bleeding does not stop, get help right away. This is a medical emergency.  If you went home on the same day as your procedure, a responsible adult should be with you for the first 24 hours after you arrive home.  Keep all follow-up visits as told by your health care provider. This is important. Contact a health care provider if:  You have a fever.  You have redness, swelling, or yellow drainage around your insertion site. Get help right away if:  You have unusual pain at the radial site.  The catheter insertion area swells very fast.  The insertion area is bleeding, and the bleeding does not stop when you hold steady pressure on the area.  Your arm or hand becomes pale, cool, tingly, or numb.   These symptoms may represent a serious problem that is an emergency. Do not wait to see if the symptoms will go away. Get medical help right away. Call your local emergency services (911 in the [place] Do not drive yourself to the hospital. Summary  After the procedure, it is common to have bruising and tenderness at the site.  Follow instructions from your health care provider about how to take care of your radial site wound. Check the wound every day for signs of infection.  Do not lift anything that is heavier than 10 lb (4.5 kg), or the limit that you are told, until your  health care provider says that it is safe. This information is not intended to replace advice given to you by your health care provider. Make sure you discuss any questions you have with your health care provider. Document Revised: 01/01/2018 Document Reviewed: 01/01/2018 Elsevier Patient Education  2021 Elsevier Inc.  

## 2021-02-03 NOTE — Interval H&P Note (Signed)
History and Physical Interval Note:  02/03/2021 11:13 AM  Cristian Nunez  has presented today for surgery, with the diagnosis of mr.  The various methods of treatment have been discussed with the patient and family. After consideration of risks, benefits and other options for treatment, the patient has consented to  Procedure(s): RIGHT/LEFT HEART CATH AND CORONARY ANGIOGRAPHY (N/A) as a surgical intervention.  The patient's history has been reviewed, patient examined, no change in status, stable for surgery.  I have reviewed the patient's chart and labs.  Questions were answered to the patient's satisfaction.     Sherren Mocha

## 2021-02-06 ENCOUNTER — Encounter (HOSPITAL_COMMUNITY): Payer: Self-pay | Admitting: Cardiovascular Disease

## 2021-02-07 ENCOUNTER — Other Ambulatory Visit: Payer: Self-pay

## 2021-02-07 ENCOUNTER — Encounter (HOSPITAL_COMMUNITY): Payer: Self-pay | Admitting: Cardiovascular Disease

## 2021-02-07 DIAGNOSIS — Z923 Personal history of irradiation: Secondary | ICD-10-CM

## 2021-02-07 DIAGNOSIS — I340 Nonrheumatic mitral (valve) insufficiency: Secondary | ICD-10-CM

## 2021-02-07 NOTE — Anesthesia Postprocedure Evaluation (Signed)
Anesthesia Post Note  Patient: Cristian Nunez  Procedure(s) Performed: INVASIVE LAB ABORTED CASE     Patient location during evaluation: Endoscopy Anesthesia Type: General Level of consciousness: awake and alert Pain management: pain level controlled Vital Signs Assessment: post-procedure vital signs reviewed and stable Respiratory status: spontaneous breathing, nonlabored ventilation, respiratory function stable and patient connected to nasal cannula oxygen Cardiovascular status: blood pressure returned to baseline and stable Postop Assessment: no apparent nausea or vomiting Anesthetic complications: no   No complications documented.  Last Vitals:  Vitals:   02/03/21 1349 02/03/21 1420  BP: 135/68 137/72  Pulse: 75 82  Resp:    Temp:    SpO2: 98% 100%    Last Pain:  Vitals:   02/03/21 1236  TempSrc:   PainSc: 0-No pain                 HODIERNE,ADAM S

## 2021-02-15 ENCOUNTER — Other Ambulatory Visit: Payer: Self-pay

## 2021-02-15 ENCOUNTER — Ambulatory Visit (HOSPITAL_COMMUNITY)
Admission: RE | Admit: 2021-02-15 | Discharge: 2021-02-15 | Disposition: A | Discharge status: Home / Self Care | Payer: Medicare Other | Source: Ambulatory Visit | Attending: Cardiovascular Disease | Admitting: Cardiovascular Disease

## 2021-02-15 DIAGNOSIS — Z923 Personal history of irradiation: Secondary | ICD-10-CM | POA: Diagnosis not present

## 2021-02-15 DIAGNOSIS — I340 Nonrheumatic mitral (valve) insufficiency: Secondary | ICD-10-CM | POA: Diagnosis not present

## 2021-02-15 DIAGNOSIS — K449 Diaphragmatic hernia without obstruction or gangrene: Secondary | ICD-10-CM | POA: Diagnosis not present

## 2021-02-21 ENCOUNTER — Other Ambulatory Visit: Payer: Self-pay

## 2021-02-21 DIAGNOSIS — R933 Abnormal findings on diagnostic imaging of other parts of digestive tract: Secondary | ICD-10-CM

## 2021-02-21 DIAGNOSIS — I340 Nonrheumatic mitral (valve) insufficiency: Secondary | ICD-10-CM

## 2021-03-07 ENCOUNTER — Telehealth: Payer: Self-pay

## 2021-03-07 NOTE — Telephone Encounter (Signed)
-----   Message from Gatha Mayer, MD sent at 03/07/2021 12:05 PM EDT ----- Ronalee Belts,  I have work-in slots. My RN Barb Merino will get him added on - Araminta Zorn please find an 16 or 350 that works. Thanks  Glendell Docker ----- Message ----- From: Sherren Mocha, MD Sent: 03/07/2021   9:21 AM EDT To: Barkley Boards, RN, Gatha Mayer, MD  Althia Forts - this is a patient who will require mitral valve surgery for treatment of severe mitral regurgitation. He needs a TEE to better define the functional anatomy of the valve, but TEE was unsuccessful because of inability to pass the probe. Complicated history with prior thyroid surgery and radiation. 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 Patient's daughter is a Economist in procedural short stay and would like for the patient to see you. I think your first availability is 5/25. If there's any chance you could see him sooner, I would really appreciate it. I'm sure you're swamped and if you're not able to work him in I completely understand.   Thx Ronalee Belts

## 2021-03-07 NOTE — Telephone Encounter (Signed)
Patient has been scheduled for 03/13/21 3:50

## 2021-03-13 ENCOUNTER — Encounter: Payer: Self-pay | Admitting: Internal Medicine

## 2021-03-13 ENCOUNTER — Other Ambulatory Visit: Payer: Self-pay

## 2021-03-13 ENCOUNTER — Ambulatory Visit (INDEPENDENT_AMBULATORY_CARE_PROVIDER_SITE_OTHER): Payer: Medicare Other | Admitting: Internal Medicine

## 2021-03-13 VITALS — BP 130/80 | HR 64 | Ht 65.25 in | Wt 157.4 lb

## 2021-03-13 DIAGNOSIS — K5900 Constipation, unspecified: Secondary | ICD-10-CM | POA: Diagnosis not present

## 2021-03-13 DIAGNOSIS — K449 Diaphragmatic hernia without obstruction or gangrene: Secondary | ICD-10-CM | POA: Diagnosis not present

## 2021-03-13 DIAGNOSIS — Z923 Personal history of irradiation: Secondary | ICD-10-CM

## 2021-03-13 DIAGNOSIS — K222 Esophageal obstruction: Secondary | ICD-10-CM

## 2021-03-13 NOTE — H&P (View-Only) (Signed)
Cristian Nunez 75 y.o. November 10, 1946 [id number]  Assessment & Plan:   Encounter Diagnoses  Name Primary?  . Esophageal stricture Yes  . Hx neck radiation   . Paraesophageal hiatal hernia   . Constipation, unspecified constipation type    He seems to have a cervical stricture or cricopharyngeal impression and I think that is why the TEE scope was not able to be passed.  The narrowing at the thoracic inlet I think is related to his paraesophageal hiatal hernia.  That could pose an issue with passage of a TEE scope as well.  Radiation in the past may have something to do with the cervical esophageal issue as well at the cricopharyngeus.  I think upper GI endoscopy with likely esophageal dilation is appropriate and it would be optimal to do this right before a TEE.  I will try to coordinate this.  I reviewed the risks benefits and indications of the procedure with the patient and his wife and they understand and agreed to proceed.  They also understand it may take some time to get the logistics of this set up so we can do this.  I think having fluoroscopy would be appropriate and best in order to accomplish his dilation.  I might need the ultraslim gastroscope as well.  I have advised trying MiraLAX on a daily basis for his hard stools with difficult defecation.  We will follow up on that when I see him for his EGD.  I appreciate the opportunity to care for this patient. CC: Renaldo Reel, PA Dr. Sherren Mocha Dr. Jenkins Rouge Dr. Jyl Heinz   Subjective:   Chief Complaint: Esophageal stricture  HPI The patient is a 75 year old white man who had an unsuccessful transesophageal echocardiogram which led to a barium swallow that shows a cervical esophageal stricture and a paraesophageal hiatal hernia.  The patient is not complaining of dysphagia though for years he has chewed very well cut his food small and taken significant liquids and sits up when he eats.  He states that when he had  an umbilical hernia repair that seemed to help his dysphagia as well.  That was years ago.  He had radiation to the neck for thyroid cancer after subtotal thyroidectomy, years ago.  Apparently he also had some scar tissue surgery there afterwards.  He needs a mitral valve repair or replacement and it is essential that he have a transesophageal echocardiogram.  I was asked by Dr. Burt Knack to see the patient.  Note that he takes omeprazole 20 mg daily for many years if he does not take it he has heartburn but it controls his heartburn symptoms very well.  The barium swallow was reviewed (report and images) IMPRESSION: Esophageal narrowing at the thoracic inlet as described. Endoscopic assessment may be helpful to further evaluate this area and exclude underlying lesion and assess for degree of narrowing.  Moderate cricopharyngeal impression, this is also seen in concert with the narrowing noted at endoscopy and on the fluoroscopic assessment and may contribute to narrowing on the current study.  Sliding and paraesophageal, mixed type hiatal hernia with limited assessment due to patient positioning. Distal esophagus not as well assessed given lack of full column evaluation in prone RAO position. Grossly normal distensibility of the remainder of the esophagus is however noted on the current study.   He also complains of progressively drier stools, some straining he is using Metamucil about 3 days out of the week and eats a very high-fiber  diet with lots of roughage.  Strains to stool sometime.  Denies any bleeding.    Patient has psoriatic arthritis followed by dermatologist in [place], the patient has been treated with a medication called Donnetta Hail which is an interleukin inhibitor but that is currently on hold because of cost issues. No Known Allergies Current Meds  Medication Sig  . acetaminophen (TYLENOL) 500 MG tablet Take 500 mg by mouth every 6 (six) hours as needed.  Marland Kitchen amLODipine  (NORVASC) 5 MG tablet Take 5 mg by mouth daily.  Marland Kitchen aspirin-acetaminophen-caffeine (EXCEDRIN MIGRAINE) 250-250-65 MG tablet Take 1 tablet by mouth every 6 (six) hours as needed for headache.  . b complex vitamins tablet Take 1 tablet by mouth daily.  . Biotin 5 MG CAPS Take 5 mg by mouth daily.  . Calcium Citrate-Vitamin D (CALCIUM + D PO) Take 1 tablet by mouth 3 (three) times a week.  . clindamycin (CLEOCIN T) 1 % lotion Apply 1 application topically daily as needed (irritation).  . clobetasol ointment (TEMOVATE) 1.61 % Apply 1 application topically 2 (two) times daily as needed (irritation).  Arna Medici 75 MCG tablet Take 75 mcg by mouth every other day. Alternates with 50 mcg every other day  . fexofenadine (ALLEGRA) 180 MG tablet Take 180 mg by mouth daily. During the Spring  . ketoconazole (NIZORAL) 2 % shampoo Apply 1 application topically every 30 (thirty) days.  Marland Kitchen levothyroxine (SYNTHROID) 50 MCG tablet Take 50 mcg by mouth every other day.  . methocarbamol (ROBAXIN) 500 MG tablet Take 500 mg by mouth every 12 (twelve) hours as needed for muscle pain. for pain  . omeprazole (PRILOSEC) 20 MG capsule Take 20 mg by mouth daily.  . pseudoephedrine (SUDAFED) 30 MG tablet Take 30 mg by mouth daily as needed for congestion.  . rosuvastatin (CRESTOR) 10 MG tablet Take 10 mg by mouth daily.  . vitamin B-12 (CYANOCOBALAMIN) 1000 MCG tablet Take 1,000 mcg by mouth 2 (two) times a week.   Past Medical History:  Diagnosis Date  . Abnormal EKG 12/09/2018  . Essential hypertension 12/09/2018  . GERD (gastroesophageal reflux disease)   . Hyperlipidemia   . Hypertension   . 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 Mitral regurgitation 11/18/2019  . Mitral valve prolapse   . Mixed dyslipidemia 12/09/2018  . Psoriatic arthritis ([place])   . Raynaud's syndrome   . Thyroid cancer [facility])    Past Surgical History:  Procedure Laterality Date  . COLONOSCOPY    . INGUINAL HERNIA REPAIR Left 01/28/2019    Procedure: LEFT INGUINAL HERNIA REPAIR WITH MESH;  Surgeon: Rolm Bookbinder, MD;  Location: [place];  Service: General;  Laterality: Left;  GENERAL ANESTHESIA AND TAP BLOCK  . KNEE ARTHROSCOPY Left   . RIGHT/LEFT HEART CATH AND CORONARY ANGIOGRAPHY N/A 02/03/2021   Procedure: RIGHT/LEFT HEART CATH AND CORONARY ANGIOGRAPHY;  Surgeon: Sherren Mocha, MD;  Location: [place] [facility];  Service: Cardiovascular;  Laterality: N/A;  . THYROIDECTOMY, PARTIAL    . UMBILICAL HERNIA REPAIR    . 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   Review of Systems As per HPI he has some decreased hearing pedal edema back pain and joint pains  Objective:   Physical Exam BP 130/80 (BP Location: Left Arm, Patient Position: Sitting, Cuff Size: Normal)   Pulse 64 Comment: irregular  Ht 5' 5.25" (1.657 m) Comment: height measured without shoes  Wt 157 lb 6 oz (71.4 kg)   BMI 25.99 kg/m  Thin well-developed white man no acute distress Eyes anicteric Lungs are clear Neck without mass status post surgical scars The heart reveals a 3/6 systolic ejection murmur best heard at the apex consistent with mitral regurgitation, S1 and S2 with an occasional premature beat. Abdomen is thin medium diastases recti versus ventral hernia favor the former.  Soft nontender without organomegaly or mass. The patient declined a rectal  exam. Extremities show hand and digit deformities consistent with psoriatic arthritis He is alert and oriented x3  Data reviewed includes things mentioned above in the HPI I have reviewed cardiology notes his cardiac cath report his attempted TEE report labs in the EMR

## 2021-03-13 NOTE — Patient Instructions (Signed)
Use over the counter Miralax for your constipation.  Dr Carlean Purl is going to communicate with your other Doctor's to figure out a day to do your procedures.  I appreciate the opportunity to care for you. Silvano Rusk, MD, [facility]

## 2021-03-13 NOTE — Progress Notes (Addendum)
Cristian Nunez 75 y.o. 1946/07/14 [id number]  Assessment & Plan:   Encounter Diagnoses  Name Primary?  . Esophageal stricture Yes  . Hx neck radiation   . Paraesophageal hiatal hernia   . Constipation, unspecified constipation type    He seems to have a cervical stricture or cricopharyngeal impression and I think that is why the TEE scope was not able to be passed.  The narrowing at the thoracic inlet I think is related to his paraesophageal hiatal hernia.  That could pose an issue with passage of a TEE scope as well.  Radiation in the past may have something to do with the cervical esophageal issue as well at the cricopharyngeus.  I think upper GI endoscopy with likely esophageal dilation is appropriate and it would be optimal to do this right before a TEE.  I will try to coordinate this.  I reviewed the risks benefits and indications of the procedure with the patient and his wife and they understand and agreed to proceed.  They also understand it may take some time to get the logistics of this set up so we can do this.  I think having fluoroscopy would be appropriate and best in order to accomplish his dilation.  I might need the ultraslim gastroscope as well.  I have advised trying MiraLAX on a daily basis for his hard stools with difficult defecation.  We will follow up on that when I see him for his EGD.  I appreciate the opportunity to care for this patient. CC: Renaldo Reel, PA Dr. Sherren Mocha Dr. Jenkins Rouge Dr. Jyl Heinz   Subjective:   Chief Complaint: Esophageal stricture  HPI The patient is a 75 year old white man who had an unsuccessful transesophageal echocardiogram which led to a barium swallow that shows a cervical esophageal stricture and a paraesophageal hiatal hernia.  The patient is not complaining of dysphagia though for years he has chewed very well cut his food small and taken significant liquids and sits up when he eats.  He states that when he had  an umbilical hernia repair that seemed to help his dysphagia as well.  That was years ago.  He had radiation to the neck for thyroid cancer after subtotal thyroidectomy, years ago.  Apparently he also had some scar tissue surgery there afterwards.  He needs a mitral valve repair or replacement and it is essential that he have a transesophageal echocardiogram.  I was asked by Dr. Burt Knack to see the patient.  Note that he takes omeprazole 20 mg daily for many years if he does not take it he has heartburn but it controls his heartburn symptoms very well.  The barium swallow was reviewed (report and images) IMPRESSION: Esophageal narrowing at the thoracic inlet as described. Endoscopic assessment may be helpful to further evaluate this area and exclude underlying lesion and assess for degree of narrowing.  Moderate cricopharyngeal impression, this is also seen in concert with the narrowing noted at endoscopy and on the fluoroscopic assessment and may contribute to narrowing on the current study.  Sliding and paraesophageal, mixed type hiatal hernia with limited assessment due to patient positioning. Distal esophagus not as well assessed given lack of full column evaluation in prone RAO position. Grossly normal distensibility of the remainder of the esophagus is however noted on the current study.   He also complains of progressively drier stools, some straining he is using Metamucil about 3 days out of the week and eats a very high-fiber  diet with lots of roughage.  Strains to stool sometime.  Denies any bleeding.    Patient has psoriatic arthritis followed by dermatologist in [place], the patient has been treated with a medication called Donnetta Hail which is an interleukin inhibitor but that is currently on hold because of cost issues. No Known Allergies Current Meds  Medication Sig  . acetaminophen (TYLENOL) 500 MG tablet Take 500 mg by mouth every 6 (six) hours as needed.  Marland Kitchen amLODipine  (NORVASC) 5 MG tablet Take 5 mg by mouth daily.  Marland Kitchen aspirin-acetaminophen-caffeine (EXCEDRIN MIGRAINE) 250-250-65 MG tablet Take 1 tablet by mouth every 6 (six) hours as needed for headache.  . b complex vitamins tablet Take 1 tablet by mouth daily.  . Biotin 5 MG CAPS Take 5 mg by mouth daily.  . Calcium Citrate-Vitamin D (CALCIUM + D PO) Take 1 tablet by mouth 3 (three) times a week.  . clindamycin (CLEOCIN T) 1 % lotion Apply 1 application topically daily as needed (irritation).  . clobetasol ointment (TEMOVATE) 9.56 % Apply 1 application topically 2 (two) times daily as needed (irritation).  Arna Medici 75 MCG tablet Take 75 mcg by mouth every other day. Alternates with 50 mcg every other day  . fexofenadine (ALLEGRA) 180 MG tablet Take 180 mg by mouth daily. During the Spring  . ketoconazole (NIZORAL) 2 % shampoo Apply 1 application topically every 30 (thirty) days.  Marland Kitchen levothyroxine (SYNTHROID) 50 MCG tablet Take 50 mcg by mouth every other day.  . methocarbamol (ROBAXIN) 500 MG tablet Take 500 mg by mouth every 12 (twelve) hours as needed for muscle pain. for pain  . omeprazole (PRILOSEC) 20 MG capsule Take 20 mg by mouth daily.  . pseudoephedrine (SUDAFED) 30 MG tablet Take 30 mg by mouth daily as needed for congestion.  . rosuvastatin (CRESTOR) 10 MG tablet Take 10 mg by mouth daily.  . vitamin B-12 (CYANOCOBALAMIN) 1000 MCG tablet Take 1,000 mcg by mouth 2 (two) times a week.   Past Medical History:  Diagnosis Date  . Abnormal EKG 12/09/2018  . Essential hypertension 12/09/2018  . GERD (gastroesophageal reflux disease)   . Hyperlipidemia   . Hypertension   . 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 Mitral regurgitation 11/18/2019  . Mitral valve prolapse   . Mixed dyslipidemia 12/09/2018  . Psoriatic arthritis ([place])   . Raynaud's syndrome   . Thyroid cancer [facility])    Past Surgical History:  Procedure Laterality Date  . COLONOSCOPY    . INGUINAL HERNIA REPAIR Left 01/28/2019    Procedure: LEFT INGUINAL HERNIA REPAIR WITH MESH;  Surgeon: Rolm Bookbinder, MD;  Location: [place];  Service: General;  Laterality: Left;  GENERAL ANESTHESIA AND TAP BLOCK  . KNEE ARTHROSCOPY Left   . RIGHT/LEFT HEART CATH AND CORONARY ANGIOGRAPHY N/A 02/03/2021   Procedure: RIGHT/LEFT HEART CATH AND CORONARY ANGIOGRAPHY;  Surgeon: Sherren Mocha, MD;  Location: [place] [facility];  Service: Cardiovascular;  Laterality: N/A;  . THYROIDECTOMY, PARTIAL    . UMBILICAL HERNIA REPAIR    . 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   Review of Systems As per HPI he has some decreased hearing pedal edema back pain and joint pains  Objective:   Physical Exam BP 130/80 (BP Location: Left Arm, Patient Position: Sitting, Cuff Size: Normal)   Pulse 64 Comment: irregular  Ht 5' 5.25" (1.657 m) Comment: height measured without shoes  Wt 157 lb 6 oz (71.4 kg)   BMI 25.99 kg/m  Thin well-developed white man no acute distress Eyes anicteric Lungs are clear Neck without mass status post surgical scars The heart reveals a 3/6 systolic ejection murmur best heard at the apex consistent with mitral regurgitation, S1 and S2 with an occasional premature beat. Abdomen is thin medium diastases recti versus ventral hernia favor the former.  Soft nontender without organomegaly or mass. The patient declined a rectal  exam. Extremities show hand and digit deformities consistent with psoriatic arthritis He is alert and oriented x3  Data reviewed includes things mentioned above in the HPI I have reviewed cardiology notes his cardiac cath report his attempted TEE report labs in the EMR

## 2021-03-15 ENCOUNTER — Other Ambulatory Visit: Payer: Self-pay

## 2021-03-15 ENCOUNTER — Telehealth: Payer: Self-pay

## 2021-03-15 DIAGNOSIS — K222 Esophageal obstruction: Secondary | ICD-10-CM

## 2021-03-15 NOTE — Telephone Encounter (Signed)
Confirmed with Larene Beach in endoscopy the patient is correctly scheduled for EGD/TEE 04/12/21.

## 2021-03-15 NOTE — Telephone Encounter (Signed)
Scheduled the patient for TEE on Apr 12, 2021 with Dr. Gasper Sells.  Dx: [facility] will arrange EGD with dilation at 1030.  Left message to call back to update patient and to arrange follow-up with Nell Range for pre-procedure visit.

## 2021-03-15 NOTE — Telephone Encounter (Signed)
Patient notified of appointment for EGD dil preceding re-attempt of TEE.  Patient is advised to arrive at [facility] admitting 04/12/21 10:30 am for 12:00 EGD.  He is asked to be NPO after midnight.  COVID screen scheduled for 5/2 8:40.  He will call back for any additional questions or concerns. Instructions sent to him via [facility]

## 2021-03-15 NOTE — Telephone Encounter (Signed)
Scheduled the patient for virtual visit with Nell Range 4/14 to update H&P for EGD/TEE.  Consent obtained.    Patient Consent for Virtual Visit         Cristian Nunez has provided verbal consent on 03/15/2021 for a virtual visit (video or telephone).   CONSENT FOR VIRTUAL VISIT FOR:  Cristian Nunez  By participating in this virtual visit I agree to the following:  I hereby voluntarily request, consent and authorize [facility] and its employed or contracted physicians, physician assistants, nurse practitioners or other licensed health care professionals (the Practitioner), to provide me with telemedicine health care services (the "Services") as deemed necessary by the treating Practitioner. I acknowledge and consent to receive the Services by the Practitioner via telemedicine. I understand that the telemedicine visit will involve communicating with the Practitioner through live audiovisual communication technology and the disclosure of certain medical information by electronic transmission. I acknowledge that I have been given the opportunity to request an in-person assessment or other available alternative prior to the telemedicine visit and am voluntarily participating in the telemedicine visit.  I understand that I have the right to withhold or withdraw my consent to the use of telemedicine in the course of my care at any time, without affecting my right to future care or treatment, and that the Practitioner or I may terminate the telemedicine visit at any time. I understand that I have the right to inspect all information obtained and/or recorded in the course of the telemedicine visit and may receive copies of available information for a reasonable fee.  I understand that some of the potential risks of receiving the Services via telemedicine include:  Marland Kitchen Delay or interruption in medical evaluation due to technological equipment failure or disruption; . Information transmitted may not be  sufficient (e.g. poor resolution of images) to allow for appropriate medical decision making by the Practitioner; and/or  . In rare instances, security protocols could fail, causing a breach of personal health information.  Furthermore, I acknowledge that it is my responsibility to provide information about my medical history, conditions and care that is complete and accurate to the best of my ability. I acknowledge that Practitioner's advice, recommendations, and/or decision may be based on factors not within their control, such as incomplete or inaccurate data provided by me or distortions of diagnostic images or specimens that may result from electronic transmissions. I understand that the practice of medicine is not an exact science and that Practitioner makes no warranties or guarantees regarding treatment outcomes. I acknowledge that a copy of this consent can be made available to me via my patient portal ([facility]), or I can request a printed copy by calling the office of [facility].    I understand that my insurance will be billed for this visit.   I have read or had this consent read to me. . I understand the contents of this consent, which adequately explains the benefits and risks of the Services being provided via telemedicine.  . I have been provided ample opportunity to ask questions regarding this consent and the Services and have had my questions answered to my satisfaction. . I give my informed consent for the services to be provided through the use of telemedicine in my medical care

## 2021-03-17 ENCOUNTER — Ambulatory Visit: Payer: Medicare Other | Admitting: Cardiology

## 2021-03-21 NOTE — Progress Notes (Signed)
[facility]ART AND VASCULAR [facility]ENTER   MULTIDISCIPLINARY HEART VALVE TEAM  Virtual Visit via Video Note   This visit type was conducted due to national recommendations for restrictions regarding the COVID-19 Pandemic (e.g. social distancing) in an effort to limit this patient's exposure and mitigate transmission in our community.  Due to his co-morbid illnesses, this patient is at least at moderate risk for complications without adequate follow up.  This format is felt to be most appropriate for this patient at this time.  All issues noted in this document were discussed and addressed.  A limited physical exam was performed with this format.  Please refer to the patient's chart for his consent to telehealth for [facility].      Evaluation Performed:  Follow-up visit  Date:  03/23/2021   ID:  Cristian Nunez, DOB 1946-03-10, MRN [id number]  Patient Location: Home Provider Location: Office/Clinic  PCP:  Renaldo Reel, PA  Cardiologist: Dr. Geraldo Pitter  Electrophysiologist:  None   Chief Complaint:  Set up EGD/TEE  History of Present Illness:    Cristian Nunez is a 75 y.o. 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   He has hx of mitral valve prolapse with stage C severe mitral regurgitation. The patient was initially seen by Dr. Burt Knack in July 2021 and elected to proceed with a stress echocardiogram and BNP in the setting of his asymptomatic status.  The stress echocardiogram demonstrated fair exercise tolerance at 6 minutes and 30 seconds according to the [facility] protocol.  He was noted to have severe mitral regurgitation at baseline that worsened with exercise.  He developed findings of mild pulmonary hypertension at peak exercise.  His BNP level was 480 which is the upper limit of normal.  He discussed surgical referral with him at that time but he preferred to follow-up closely as he did  not want to move forward with any intervention during the COVID-19 pandemic. He was seen back by Dr. Burt Knack on 12/12/20 and it was decided to proceed with TEE for further evaluation of mitral valve and potential surgical referral.   He was set up for TEE on 02/03/21 which was aborted bc the probe could not be safely passed down the esophagus. He was referred to Dr. Carlean Purl with GI who felt he has a cervical stricture or cricopharyngeal impression related to hiatal hernia or previous neck radiation. He planned to proceed with upper endoscopy with esophageal dilation followed by TEE. This has been set up for 4/19.  Today he presents via virtual medicine to discuss upcoming procedure. No CP or SOB. No LE edema, orthopnea or PND. No dizziness or syncope. No blood in stool or urine. No palpitations.    Past Medical History:  Diagnosis Date  . Abnormal EKG 12/09/2018  . Essential hypertension 12/09/2018  . GERD (gastroesophageal reflux disease)   . Hyperlipidemia   . Hypertension   . 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 Mitral regurgitation 11/18/2019  . Mitral valve prolapse   . Mixed dyslipidemia 12/09/2018  . Psoriatic arthritis ([place])   . Raynaud's syndrome   . Thyroid cancer [facility])    Past Surgical History:  Procedure Laterality Date  . COLONOSCOPY    . INGUINAL HERNIA REPAIR Left 01/28/2019   Procedure: LEFT INGUINAL HERNIA REPAIR WITH MESH;  Surgeon: Rolm Bookbinder, MD;  Location: [facility];  Service: General;  Laterality: Left;  GENERAL ANESTHESIA AND TAP BLOCK  . KNEE ARTHROSCOPY Left   . RIGHT/LEFT HEART CATH AND CORONARY ANGIOGRAPHY N/A 02/03/2021   Procedure: RIGHT/LEFT HEART CATH AND CORONARY ANGIOGRAPHY;  Surgeon: Sherren Mocha, MD;  Location: [facility];  Service: Cardiovascular;  Laterality: N/A;  . THYROIDECTOMY, PARTIAL    . UMBILICAL HERNIA REPAIR    . VARICOSE VEIN SURGERY Right      Current Meds  Medication Sig  . acetaminophen (TYLENOL) 500 MG tablet  Take 500 mg by mouth every 6 (six) hours as needed.  Marland Kitchen amLODipine (NORVASC) 5 MG tablet Take 5 mg by mouth daily.  Marland Kitchen aspirin-acetaminophen-caffeine (EXCEDRIN MIGRAINE) 250-250-65 MG tablet Take 1 tablet by mouth every 6 (six) hours as needed for headache.  . b complex vitamins tablet Take 1 tablet by mouth daily.  . Biotin 5 MG CAPS Take 5 mg by mouth daily.  . Calcium Citrate-Vitamin D (CALCIUM + D PO) Take 1 tablet by mouth 3 (three) times a week.  . clindamycin (CLEOCIN T) 1 % lotion Apply 1 application topically daily as needed (irritation).  . clobetasol ointment (TEMOVATE) 4.08 % Apply 1 application topically 2 (two) times daily as needed (irritation).  Arna Medici 75 MCG tablet Take 75 mcg by mouth every other day. Alternates with 50 mcg every other day  . fexofenadine (ALLEGRA) 180 MG tablet Take 180 mg by mouth daily. During the Spring  . ketoconazole (NIZORAL) 2 % shampoo Apply 1 application topically every 30 (thirty) days.  Marland Kitchen levothyroxine (SYNTHROID) 50 MCG tablet Take 50 mcg by mouth every other day.  . methocarbamol (ROBAXIN) 500 MG tablet Take 500 mg by mouth every 12 (twelve) hours as needed for muscle pain. for pain  . omeprazole (PRILOSEC) 20 MG capsule Take 20 mg by mouth daily.  . pseudoephedrine (SUDAFED) 30 MG tablet Take 30 mg by mouth daily as needed for congestion.  . rosuvastatin (CRESTOR) 10 MG tablet Take 10 mg by mouth daily.  . vitamin B-12 (CYANOCOBALAMIN) 1000 MCG tablet Take 1,000 mcg by mouth 2 (two) times a week.     Allergies:   Patient has no known allergies.   Social History   Tobacco Use  . Smoking status: Never Smoker  . Smokeless tobacco: Never Used  Vaping Use  . Vaping Use: Never used  Substance Use Topics  . Alcohol use: Yes    Alcohol/week: 7.0 standard drinks    Types: 7 Cans of beer per week    Comment: 2-3 beers daily  . Drug use: Never     Family Hx: The patient's family history includes Arthritis in his mother; Crohn's disease in  his sister; Heart Problems in his maternal grandmother; Heart attack in his maternal grandfather; Hypertension in his mother; Leukemia in his brother; Skin cancer in his sister; Stroke in his father.  ROS:   Please see the history of present illness.    All other systems reviewed and are negative.   Prior CV studies:   The following studies were reviewed today:  Echo 06/23/2020: IMPRESSIONS 1. Left ventricular ejection fraction, by estimation, is 60 to 65%. The  left ventricle has normal function. The left ventricle has no regional  wall motion abnormalities. Left ventricular diastolic parameters are  consistent with Grade I diastolic  dysfunction (impaired relaxation). Elevated left ventricular end-diastolic  pressure.  2. Right ventricular systolic function is normal. The right ventricular  size is normal. There is normal pulmonary artery systolic pressure.  3.  Left atrial size was severely dilated.  4. Moderate elongation of the mitral anterior and posterior leaflets.  5. The mitral valve is myxomatous. 2 separate moderate to severe jets  mitral valve regurgitation. In summary, there is severe regurgitation by  combined PISA.  6. The aortic valve is tricuspid. Aortic valve regurgitation is not  visualized.  7. The inferior vena cava is normal in size with greater than 50%  respiratory variability, suggesting right atrial pressure of 3 mmHg.   Comparison(s): 12/16/18 EF 60-65%. Moderate-severe MR. Recommend further  evaluation with TEE and work-up for possible MV repair or replacement.   Stress Echo: IMPRESSIONS  1. Excellent LV contractile reserve.  2. Severe mitral regurgitation at rest and stress (worsened severity  during stress) with increase in estimated pulmonary pressures.  3. 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  4. This is a negative stress echocardiogram for ischemia.  5. At baseline, severe mitral regurgitation was present. During peak   stress, severe mitral regurgitation was present.  6. The baseline PASP was estimated to be 32.1 mmHg. The PASP increased to  45.0 mmHg at peak stress.  7. This is an intermediate risk study.   Labs/Other Tests and Data Reviewed:    EKG:  No ECG reviewed.  Recent Labs: 06/23/2020: NT-Pro BNP 480 01/25/2021: BUN 13; Creatinine, Ser 0.88; Platelets 182 02/03/2021: Hemoglobin 15.0; Potassium 3.6; Sodium 135   Recent Lipid Panel Lab Results  Component Value Date/Time   CHOL 145 05/20/2019 10:17 AM   TRIG 96 05/20/2019 10:17 AM   HDL 46 05/20/2019 10:17 AM   CHOLHDL 3.2 05/20/2019 10:17 AM   LDLCALC 80 05/20/2019 10:17 AM    Wt Readings from Last 3 Encounters:  03/23/21 155 lb (70.3 kg)  03/13/21 157 lb 6 oz (71.4 kg)  02/03/21 155 lb (70.3 kg)     Objective:    Vital Signs:  BP 134/79   Temp (!) 97.1 F (36.2 C)   Ht 5\' 7"  (1.702 m)   Wt 155 lb (70.3 kg)   BMI 24.28 kg/m    Well nourished, well developed male in noacute distress.   ASSESSMENT & PLAN:    Mitral valve prolapse with severe MR: planning for TEE followed by EGD with esophageal dilation on 04/12/21  The risks [esophageal damage, perforation (1:10,000 risk), bleeding, pharyngeal hematoma as well as other potential complications associated with conscious sedation including aspiration, arrhythmia, respiratory failure and death], benefits (treatment guidance and diagnostic support) and alternatives of a transesophageal echocardiogram were discussed in detail with Mr. Colberg and he is willing to proceed.    COVID-19 Education: The signs and symptoms of COVID-19 were discussed with the patient and how to seek care for testing (follow up with PCP or arrange E-visit).  The importance of social distancing was discussed today.  Time:   Today, I have spent 10 minutes with the patient with telehealth technology discussing the above problems.     Medication Adjustments/Labs and Tests Ordered: Current medicines are  reviewed at length with the patient today.  Concerns regarding medicines are outlined above.   Tests Ordered: No orders of the defined types were placed in this encounter.   Medication Changes: No orders of the defined types were placed in this encounter.    Disposition:  Follow up as directed after TEE  Signed, Angelena Form, PA-C  03/23/2021 1:37 PM    [facility]

## 2021-03-23 ENCOUNTER — Other Ambulatory Visit: Payer: Self-pay

## 2021-03-23 ENCOUNTER — Telehealth (INDEPENDENT_AMBULATORY_CARE_PROVIDER_SITE_OTHER): Payer: Medicare Other | Admitting: Physician Assistant

## 2021-03-23 VITALS — BP 134/79 | Temp 97.1°F | Ht 67.0 in | Wt 155.0 lb

## 2021-03-23 DIAGNOSIS — I340 Nonrheumatic mitral (valve) insufficiency: Secondary | ICD-10-CM | POA: Diagnosis not present

## 2021-03-23 NOTE — Addendum Note (Signed)
Addended by: Harland German A on: 03/23/2021 02:50 PM   Modules accepted: Orders

## 2021-03-23 NOTE — Patient Instructions (Addendum)
Hello Cristian Nunez!  Nice to speak with you today. Below are details about your upcoming appointments:  LABS (4/28 or 4/29): Please proceed to [facility] in [place] (or any [facility]) on April 28 or 29th to have your pre-procedural labs drawn. You do not need to be fasting.  COVID SCREENING INFORMATION (5/2): You are scheduled for your drive-thru COVID screening on 5/2 between 8:30AM and 9:30AM. Pre-Procedural COVID-19 Testing Site [address] [place], [place] [address] You will need to go home after your screening and quarantine until your procedure.  EGD and TEE (5/4): Arrive at 10:30AM in Admitting (Main Entrance A at [facility]) for procedures starting at noon.  Your instructions have already been given per GI.  Let us know if you need anything. Good luck!

## 2021-03-23 NOTE — Addendum Note (Signed)
Addended by: Harland German A on: 03/23/2021 02:14 PM   Modules accepted: Orders

## 2021-04-06 DIAGNOSIS — I340 Nonrheumatic mitral (valve) insufficiency: Secondary | ICD-10-CM | POA: Diagnosis not present

## 2021-04-06 LAB — CBC WITH DIFFERENTIAL/PLATELET
Basophils Absolute: 0 10*3/uL (ref 0.0–0.2)
Basos: 1 %
EOS (ABSOLUTE): 0.2 10*3/uL (ref 0.0–0.4)
Eos: 6 %
Hematocrit: 42.4 % (ref 37.5–51.0)
Hemoglobin: 14.3 g/dL (ref 13.0–17.7)
Immature Grans (Abs): 0 10*3/uL (ref 0.0–0.1)
Immature Granulocytes: 1 %
Lymphocytes Absolute: 1.1 10*3/uL (ref 0.7–3.1)
Lymphs: 26 %
MCH: 31.6 pg (ref 26.6–33.0)
MCHC: 33.7 g/dL (ref 31.5–35.7)
MCV: 94 fL (ref 79–97)
Monocytes Absolute: 0.6 10*3/uL (ref 0.1–0.9)
Monocytes: 14 %
Neutrophils Absolute: 2.2 10*3/uL (ref 1.4–7.0)
Neutrophils: 52 %
Platelets: 183 10*3/uL (ref 150–450)
RBC: 4.52 x10E6/uL (ref 4.14–5.80)
RDW: 11.9 % (ref 11.6–15.4)
WBC: 4.1 10*3/uL (ref 3.4–10.8)

## 2021-04-07 LAB — BASIC METABOLIC PANEL
BUN/Creatinine Ratio: 15 (ref 10–24)
BUN: 14 mg/dL (ref 8–27)
CO2: 28 mmol/L (ref 20–29)
Calcium: 9.6 mg/dL (ref 8.6–10.2)
Chloride: 96 mmol/L (ref 96–106)
Creatinine, Ser: 0.94 mg/dL (ref 0.76–1.27)
Glucose: 92 mg/dL (ref 65–99)
Potassium: 4.9 mmol/L (ref 3.5–5.2)
Sodium: 136 mmol/L (ref 134–144)
eGFR: 84 mL/min/{1.73_m2} (ref >59)

## 2021-04-10 ENCOUNTER — Other Ambulatory Visit (HOSPITAL_COMMUNITY)
Admission: RE | Admit: 2021-04-10 | Discharge: 2021-04-10 | Disposition: A | Discharge status: Home / Self Care | Payer: Medicare Other | Source: Ambulatory Visit | Attending: Internal Medicine | Admitting: Internal Medicine

## 2021-04-10 DIAGNOSIS — Z01812 Encounter for preprocedural laboratory examination: Secondary | ICD-10-CM | POA: Diagnosis not present

## 2021-04-10 DIAGNOSIS — Z20822 Contact with and (suspected) exposure to covid-19: Secondary | ICD-10-CM | POA: Insufficient documentation

## 2021-04-11 ENCOUNTER — Other Ambulatory Visit: Payer: Self-pay

## 2021-04-11 ENCOUNTER — Encounter (HOSPITAL_COMMUNITY): Payer: Self-pay | Admitting: Internal Medicine

## 2021-04-11 LAB — SARS CORONAVIRUS 2 (TAT 6-24 HRS): SARS Coronavirus 2: NEGATIVE

## 2021-04-11 NOTE — Progress Notes (Signed)
Cristian Nunez denies chest pain or shortness of breath. Patient was tested for Covid and has been in quarantine since that time.

## 2021-04-12 ENCOUNTER — Ambulatory Visit (HOSPITAL_COMMUNITY): Payer: Medicare Other | Admitting: Certified Registered"

## 2021-04-12 ENCOUNTER — Encounter (HOSPITAL_COMMUNITY)
Admission: RE | Disposition: A | Discharge status: Home / Self Care | Payer: Self-pay | Source: Home / Self Care | Attending: Internal Medicine

## 2021-04-12 ENCOUNTER — Ambulatory Visit (HOSPITAL_COMMUNITY)
Admission: RE | Admit: 2021-04-12 | Discharge: 2021-04-12 | Disposition: A | Discharge status: Home / Self Care | Payer: Medicare Other | Attending: Internal Medicine | Admitting: Internal Medicine

## 2021-04-12 ENCOUNTER — Encounter (HOSPITAL_COMMUNITY): Payer: Self-pay | Admitting: Internal Medicine

## 2021-04-12 ENCOUNTER — Ambulatory Visit (HOSPITAL_BASED_OUTPATIENT_CLINIC_OR_DEPARTMENT_OTHER): Payer: Medicare Other

## 2021-04-12 DIAGNOSIS — Z79890 Hormone replacement therapy (postmenopausal): Secondary | ICD-10-CM | POA: Diagnosis not present

## 2021-04-12 DIAGNOSIS — I081 Rheumatic disorders of both mitral and tricuspid valves: Secondary | ICD-10-CM | POA: Diagnosis not present

## 2021-04-12 DIAGNOSIS — Z419 Encounter for procedure for purposes other than remedying health state, unspecified: Secondary | ICD-10-CM

## 2021-04-12 DIAGNOSIS — Z85850 Personal history of malignant neoplasm of thyroid: Secondary | ICD-10-CM | POA: Diagnosis not present

## 2021-04-12 DIAGNOSIS — I77810 Thoracic aortic ectasia: Secondary | ICD-10-CM | POA: Diagnosis not present

## 2021-04-12 DIAGNOSIS — E039 Hypothyroidism, unspecified: Secondary | ICD-10-CM | POA: Diagnosis not present

## 2021-04-12 DIAGNOSIS — G9389 Other specified disorders of brain: Secondary | ICD-10-CM | POA: Diagnosis not present

## 2021-04-12 DIAGNOSIS — Z79899 Other long term (current) drug therapy: Secondary | ICD-10-CM | POA: Diagnosis not present

## 2021-04-12 DIAGNOSIS — Z7982 Long term (current) use of aspirin: Secondary | ICD-10-CM | POA: Insufficient documentation

## 2021-04-12 DIAGNOSIS — Z8379 Family history of other diseases of the digestive system: Secondary | ICD-10-CM | POA: Diagnosis not present

## 2021-04-12 DIAGNOSIS — I340 Nonrheumatic mitral (valve) insufficiency: Secondary | ICD-10-CM | POA: Insufficient documentation

## 2021-04-12 DIAGNOSIS — K222 Esophageal obstruction: Secondary | ICD-10-CM | POA: Insufficient documentation

## 2021-04-12 DIAGNOSIS — I700 Atherosclerosis of aorta: Secondary | ICD-10-CM | POA: Diagnosis not present

## 2021-04-12 DIAGNOSIS — Q211 Atrial septal defect: Secondary | ICD-10-CM | POA: Insufficient documentation

## 2021-04-12 DIAGNOSIS — Z923 Personal history of irradiation: Secondary | ICD-10-CM | POA: Insufficient documentation

## 2021-04-12 DIAGNOSIS — Z9889 Other specified postprocedural states: Secondary | ICD-10-CM

## 2021-04-12 DIAGNOSIS — K449 Diaphragmatic hernia without obstruction or gangrene: Secondary | ICD-10-CM | POA: Diagnosis not present

## 2021-04-12 DIAGNOSIS — E782 Mixed hyperlipidemia: Secondary | ICD-10-CM | POA: Diagnosis not present

## 2021-04-12 DIAGNOSIS — L4050 Arthropathic psoriasis, unspecified: Secondary | ICD-10-CM | POA: Insufficient documentation

## 2021-04-12 HISTORY — PX: TEE WITHOUT CARDIOVERSION: SHX5443

## 2021-04-12 HISTORY — PX: ESOPHAGOGASTRODUODENOSCOPY (EGD) WITH PROPOFOL: SHX5813

## 2021-04-12 HISTORY — PX: BALLOON DILATION: SHX5330

## 2021-04-12 HISTORY — DX: Other specified postprocedural states: Z98.890

## 2021-04-12 SURGERY — ESOPHAGOGASTRODUODENOSCOPY (EGD) WITH PROPOFOL
Anesthesia: Monitor Anesthesia Care

## 2021-04-12 SURGERY — ECHOCARDIOGRAM, TRANSESOPHAGEAL
Anesthesia: Monitor Anesthesia Care

## 2021-04-12 MED ORDER — SODIUM CHLORIDE 0.9 % IV SOLN: INTRAVENOUS | Status: DC

## 2021-04-12 MED ORDER — PROPOFOL 10 MG/ML IV BOLUS
INTRAVENOUS | Status: DC | PRN
  Administered 2021-04-12: 20 mg via INTRAVENOUS
  Administered 2021-04-12: 60 mg via INTRAVENOUS
  Administered 2021-04-12: 20 mg via INTRAVENOUS

## 2021-04-12 MED ORDER — PROPOFOL 500 MG/50ML IV EMUL
INTRAVENOUS | Status: DC | PRN
  Administered 2021-04-12: 150 ug/kg/min via INTRAVENOUS

## 2021-04-12 SURGICAL SUPPLY — 14 items

## 2021-04-12 NOTE — Anesthesia Procedure Notes (Signed)
Procedure Name: MAC Date/Time: 04/12/2021 1:03 PM Performed by: Leonor Liv, CRNA Pre-anesthesia Checklist: Patient identified, Emergency Drugs available, Suction available, Patient being monitored and Timeout performed Patient Re-evaluated:Patient Re-evaluated prior to induction Oxygen Delivery Method: Nasal cannula Airway Equipment and Method: Bite block Placement Confirmation: positive ETCO2 Dental Injury: Teeth and Oropharynx as per pre-operative assessment

## 2021-04-12 NOTE — Brief Op Note (Signed)
04/12/2021  1:27 PM  PATIENT:  Cristian Nunez  75 y.o. male  PRE-OPERATIVE DIAGNOSIS:  esophageal stricture  POST-OPERATIVE DIAGNOSIS:  esophageal stricture, post radation scarring, hiatal hernia  PROCEDURE:  Procedure(s) with comments: ESOPHAGOGASTRODUODENOSCOPY (EGD) WITH PROPOFOL (N/A) - needs fluoro BALLOON DILATION (N/A) TRANSESOPHAGEAL ECHOCARDIOGRAM (TEE) (N/A)  SURGEON:  Surgeon(s) and Role: Panel 1:    * Gatha Mayer, MD - Primary Panel 2:    Werner Lean, MD - Primary   EUS stenosis/stricture dilated to 18 mm w/ balloon  5 cm hiatal hernia  Otherwise NL  TEE scope inserted successfully by cardiology

## 2021-04-12 NOTE — Discharge Instructions (Signed)
YOU HAD AN ENDOSCOPIC PROCEDURE TODAY: Refer to the procedure report and other information in the discharge instructions given to you for any specific questions about what was found during the examination. If this information does not answer your questions, please call [place] office at [phone] to clarify.   YOU SHOULD EXPECT: Some feelings of bloating in the abdomen. Passage of more gas than usual. Walking can help get rid of the air that was put into your GI tract during the procedure and reduce the bloating. If you had a lower endoscopy (such as a colonoscopy or flexible sigmoidoscopy) you may notice spotting of blood in your stool or on the toilet paper. Some abdominal soreness may be present for a day or two, also.  DIET: Your first meal following the procedure should be a light meal and then it is ok to progress to your normal diet. A half-sandwich or bowl of soup is an example of a good first meal. Heavy or fried foods are harder to digest and may make you feel nauseous or bloated. Drink plenty of fluids but you should avoid alcoholic beverages for 24 hours. If you had a esophageal dilation, please see attached instructions for diet.    ACTIVITY: Your care partner should take you home directly after the procedure. You should plan to take it easy, moving slowly for the rest of the day. You can resume normal activity the day after the procedure however YOU SHOULD NOT DRIVE, use power tools, machinery or perform tasks that involve climbing or major physical exertion for 24 hours (because of the sedation medicines used during the test).   SYMPTOMS TO REPORT IMMEDIATELY: A gastroenterologist can be reached at any hour. Please call [phone]  for any of the following symptoms:  . Following lower endoscopy (colonoscopy, flexible sigmoidoscopy) Excessive amounts of blood in the stool  Significant tenderness, worsening of abdominal pains  Swelling of the abdomen that is new, acute  Fever of  100 or higher  . Following upper endoscopy (EGD, EUS, ERCP, esophageal dilation) Vomiting of blood or coffee ground material  New, significant abdominal pain  New, significant chest pain or pain under the shoulder blades  Painful or persistently difficult swallowing  New shortness of breath  Black, tarry-looking or red, bloody stools  FOLLOW UP:  If any biopsies were taken you will be contacted by phone or by letter within the next 1-3 weeks. Call [phone]  if you have not heard about the biopsies in 3 weeks.  Please also call with any specific questions about appointments or follow up tests. [email or web address]  Esophageal Dilatation Esophageal dilatation, also called esophageal dilation, is a procedure to widen or open a blocked or narrowed part of the esophagus. The esophagus is the part of the body that moves food and liquid from the mouth to the stomach. You may need this procedure if:  You have a buildup of scar tissue in your esophagus that makes it difficult, painful, or impossible to swallow. This can be caused by gastroesophageal reflux disease (GERD).  You have cancer of the esophagus.  There is a problem with how food moves through your esophagus. In some cases, you may need this procedure repeated at a later time to dilate the esophagus gradually. Tell a health care provider about:  Any allergies you have.  All medicines you are taking, including vitamins, herbs, eye drops, creams, and over-the-counter medicines.  Any problems you or family members have had with anesthetic medicines.  Any blood disorders you  have.  Any surgeries you have had.  Any medical conditions you have.  Any antibiotic medicines you are required to take before dental procedures.  Whether you are pregnant or may be pregnant. What are the risks? Generally, this is a safe procedure. However, problems may occur,  including:  Bleeding due to a tear in the lining of the esophagus.  A hole, or perforation, in the esophagus. What happens before the procedure?  Ask your health care provider about: ? Changing or stopping your regular medicines. This is especially important if you are taking diabetes medicines or blood thinners. ? Taking medicines such as aspirin and ibuprofen. These medicines can thin your blood. Do not take these medicines unless your health care provider tells you to take them. ? Taking over-the-counter medicines, vitamins, herbs, and supplements.  Follow instructions from your health care provider about eating or drinking restrictions.  Plan to have a responsible adult take you home from the hospital or clinic.  Plan to have a responsible adult care for you for the time you are told after you leave the hospital or clinic. This is important. What happens during the procedure?  You may be given a medicine to help you relax (sedative).  A numbing medicine may be sprayed into the back of your throat, or you may gargle the medicine.  Your health care provider may perform the dilatation using various surgical instruments, such as: ? Simple dilators. This instrument is carefully placed in the esophagus to stretch it. ? Guided wire bougies. This involves using an endoscope to insert a wire into the esophagus. A dilator is passed over this wire to enlarge the esophagus. Then the wire is removed. ? Balloon dilators. An endoscope with a small balloon is inserted into the esophagus. The balloon is inflated to stretch the esophagus and open it up. The procedure may vary among health care providers and hospitals. What can I expect after the procedure?  Your blood pressure, heart rate, breathing rate, and blood oxygen level will be monitored until you leave the hospital or clinic.  Your throat may feel slightly sore and numb. This will get better over time.  You will not be allowed to eat or  drink until your throat is no longer numb.  When you are able to drink, urinate, and sit on the edge of the bed without nausea or dizziness, you may be able to return home. Follow these instructions at home:  Take over-the-counter and prescription medicines only as told by your health care provider.  If you were given a sedative during the procedure, it can affect you for several hours. Do not drive or operate machinery until your health care provider says that it is safe.  Plan to have a responsible adult care for you for the time you are told. This is important.  Follow instructions from your health care provider about any eating or drinking restrictions.  Do not use any products that contain nicotine or tobacco, such as cigarettes, e-cigarettes, and chewing tobacco. If you need help quitting, ask your health care provider.  Keep all follow-up visits. This is important. Contact a health care provider if:  You have a fever.  You have pain that is not relieved by medicine. Get help right away if:  You have chest pain.  You have trouble breathing.  You have trouble swallowing.  You vomit blood.  You have black, tarry, or bloody stools. These symptoms may represent a serious problem that is an emergency.  Do not wait to see if the symptoms will go away. Get medical help right away. Call your local emergency services (911 in the [place] Do not drive yourself to the hospital. Summary  Esophageal dilatation, also called esophageal dilation, is a procedure to widen or open a blocked or narrowed part of the esophagus.  Plan to have a responsible adult take you home from the hospital or clinic.  For this procedure, a numbing medicine may be sprayed into the back of your throat, or you may gargle the medicine.  Do not drive or operate machinery until your health care provider says that it is safe. This information is not intended to replace advice given to you by your health care  provider. Make sure you discuss any questions you have with your health care provider. Document Revised: 04/13/2020 Document Reviewed: 04/13/2020 Elsevier Patient Education  2021 [place].  TEE  YOU HAD AN CARDIAC PROCEDURE TODAY: Refer to the procedure report and other information in the discharge instructions given to you for any specific questions about what was found during the examination. If this information does not answer your questions, please call [facility] office at [phone] to clarify.   DIET: Your first meal following the procedure should be a light meal and then it is ok to progress to your normal diet. A half-sandwich or bowl of soup is an example of a good first meal. Heavy or fried foods are harder to digest and may make you feel nauseous or bloated. Drink plenty of fluids but you should avoid alcoholic beverages for 24 hours. If you had a esophageal dilation, please see attached instructions for diet.   ACTIVITY: Your care partner should take you home directly after the procedure. You should plan to take it easy, moving slowly for the rest of the day. You can resume normal activity the day after the procedure however YOU SHOULD NOT DRIVE, use power tools, machinery or perform tasks that involve climbing or major physical exertion for 24 hours (because of the sedation medicines used during the test).

## 2021-04-12 NOTE — CV Procedure (Signed)
    TRANSESOPHAGEAL ECHOCARDIOGRAM   NAME:  Cristian Nunez    MRN: [id number] DOB:  Jul 27, 1946    ADMIT DATE: 04/12/2021  INDICATIONS: Mitral regurgitation.  PROCEDURE:   Informed consent was obtained prior to the procedure. The risks, benefits and alternatives for the procedure were discussed and the patient comprehended these risks.  Risks include, but are not limited to, cough, sore throat, vomiting, nausea, somnolence, esophageal and stomach trauma or perforation, bleeding, low blood pressure, aspiration, pneumonia, infection, trauma to the teeth and death.    Procedural time out performed. The oropharynx was anesthetized .  Patient was under anesthesia from combination procedure with EGD (see Dr. Celesta Aver note for further details.  Anesthesia was administered by Dr. Linna Caprice and team.  The patient was administered a total of 500 mg Propofol to achieve and maintain moderate conscious sedation (through both procedures).  The patient's heart rate, blood pressure, and oxygen saturation are monitored continuously during the procedure. The period of conscious sedation is 27 minutes, of which I was present face-to-face 100% of this time.   The transesophageal probe was inserted in the esophagus and stomach without difficulty and multiple views were obtained.   COMPLICATIONS:    There were no immediate complications.  KEY FINDINGS:  1. Severe Mitral Regurgitation with measurements for edge to edge repair procedure. 2. Chiari Malformation 3. PFO.  4. Full report to follow. 5. Further management per primary team.   Rudean Haskell, MD [employer]  2:00 PM

## 2021-04-12 NOTE — Anesthesia Postprocedure Evaluation (Signed)
Anesthesia Post Note  Patient: Cristian Nunez  Procedure(s) Performed: ESOPHAGOGASTRODUODENOSCOPY (EGD) WITH PROPOFOL (N/A ) BALLOON DILATION (N/A ) TRANSESOPHAGEAL ECHOCARDIOGRAM (TEE) (N/A )     Patient location during evaluation: Endoscopy Anesthesia Type: MAC Level of consciousness: awake and alert Pain management: pain level controlled Vital Signs Assessment: post-procedure vital signs reviewed and stable Respiratory status: spontaneous breathing, nonlabored ventilation, respiratory function stable and patient connected to nasal cannula oxygen Cardiovascular status: stable and blood pressure returned to baseline Postop Assessment: no apparent nausea or vomiting Anesthetic complications: no   No complications documented.  Last Vitals:  Vitals:   04/12/21 1410 04/12/21 1420  BP: (!) 142/71 (!) 152/76  Pulse: 62 (!) 59  Resp: 18 14  Temp:    SpO2: 98% 100%    Last Pain:  Vitals:   04/12/21 1420  TempSrc:   PainSc: 0-No pain                 Allan Minotti COKER

## 2021-04-12 NOTE — Progress Notes (Signed)
  Echocardiogram Echocardiogram Transesophageal has been performed.  Cristian Nunez 04/12/2021, 2:03 PM

## 2021-04-12 NOTE — Transfer of Care (Signed)
Immediate Anesthesia Transfer of Care Note  Patient: Cristian Nunez  Procedure(s) Performed: ESOPHAGOGASTRODUODENOSCOPY (EGD) WITH PROPOFOL (N/A ) BALLOON DILATION (N/A ) TRANSESOPHAGEAL ECHOCARDIOGRAM (TEE) (N/A )  Patient Location: Endoscopy Unit  Anesthesia Type:MAC  Level of Consciousness: sedated  Airway & Oxygen Therapy: Patient Spontanous Breathing and Patient connected to nasal cannula oxygen  Post-op Assessment: Report given to RN and Post -op Vital signs reviewed and stable  Post vital signs: Reviewed and stable  Last Vitals:  Vitals Value Taken Time  BP    Temp    Pulse 67 04/12/21 1403  Resp 17 04/12/21 1403  SpO2 96 % 04/12/21 1403  Vitals shown include unvalidated device data.  Last Pain:  Vitals:   04/12/21 1050  TempSrc: Temporal  PainSc: 0-No pain         Complications: No complications documented.

## 2021-04-12 NOTE — Interval H&P Note (Signed)
History and Physical Interval Note:  04/12/2021 11:56 AM  Cristian Nunez  has presented today for surgery, with the diagnosis of esophageal stricture.  The various methods of treatment have been discussed with the patient and family. After consideration of risks, benefits and other options for treatment, the patient has consented to  Procedure(s) with comments: ESOPHAGOGASTRODUODENOSCOPY (EGD) WITH PROPOFOL (N/A) - needs fluoro BALLOON DILATION (N/A) SAVORY DILATION (N/A) TRANSESOPHAGEAL ECHOCARDIOGRAM (TEE) (N/A) as a surgical intervention.  The patient's history has been reviewed, patient examined, no change in status, stable for surgery.  I have reviewed the patient's chart and labs.  Questions were answered to the patient's satisfaction.     Silvano Rusk

## 2021-04-12 NOTE — Anesthesia Preprocedure Evaluation (Signed)
Anesthesia Evaluation  Patient identified by MRN, date of birth, ID band Patient awake    Reviewed: Allergy & Precautions, NPO status , Patient's Chart, lab work & pertinent test results  Airway Mallampati: II  TM Distance: >3 FB Neck ROM: Full    Dental  (+) Teeth Intact, Dental Advisory Given   Pulmonary    breath sounds clear to auscultation       Cardiovascular hypertension,  Rhythm:Regular Rate:Normal + Systolic murmurs    Neuro/Psych    GI/Hepatic   Endo/Other    Renal/GU      Musculoskeletal   Abdominal   Peds  Hematology   Anesthesia Other Findings   Reproductive/Obstetrics                             Anesthesia Physical Anesthesia Plan  ASA: III  Anesthesia Plan: MAC   Post-op Pain Management:    Induction: Intravenous  PONV Risk Score and Plan: Ondansetron and Dexamethasone  Airway Management Planned: Natural Airway and Nasal Cannula  Additional Equipment:   Intra-op Plan:   Post-operative Plan:   Informed Consent: I have reviewed the patients History and Physical, chart, labs and discussed the procedure including the risks, benefits and alternatives for the proposed anesthesia with the patient or authorized representative who has indicated his/her understanding and acceptance.     Dental advisory given  Plan Discussed with: CRNA and Anesthesiologist  Anesthesia Plan Comments:         Anesthesia Quick Evaluation

## 2021-04-13 ENCOUNTER — Telehealth: Payer: Self-pay

## 2021-04-13 ENCOUNTER — Encounter (HOSPITAL_COMMUNITY): Payer: Self-pay | Admitting: Internal Medicine

## 2021-04-13 LAB — ECHO TEE
MV M vel: 5.5 m/s
MV Peak grad: 121 mmHg
Radius: 1 cm

## 2021-04-13 NOTE — Op Note (Signed)
[facility] Patient Name: Cristian Nunez Procedure Date : 04/12/2021 MRN: [id number] Attending MD: Gatha Mayer , MD Date of Birth: March 07, 1946 CSN: [id number] Age: 75 Admit Type: Inpatient Procedure:                Upper GI endoscopy Indications:              Stricture of the esophagus - PREVIOUS ATTEMPT AT                            TRANSESOPHAGEAL ECHOCARDIOGRAM UNSUCCESSFUL Providers:                Gatha Mayer, MD, Benay Pillow, RN, Ladona Ridgel, Technician Referring MD:              Medicines:                Propofol per Anesthesia, Monitored Anesthesia Care Complications:            No immediate complications. Estimated Blood Loss:     Estimated blood loss: none. Procedure:                Pre-Anesthesia Assessment:                           - Prior to the procedure, a History and Physical                            was performed, and patient medications and                            allergies were reviewed. The patient's tolerance of                            previous anesthesia was also reviewed. The risks                            and benefits of the procedure and the sedation                            options and risks were discussed with the patient.                            All questions were answered, and informed consent                            was obtained. Prior Anticoagulants: The patient has                            taken no previous anticoagulant or antiplatelet                            agents. ASA Grade Assessment: III - A patient with  severe systemic disease. After reviewing the risks                            and benefits, the patient was deemed in                            satisfactory condition to undergo the procedure.                           After obtaining informed consent, the endoscope was                            passed under direct vision. Throughout the                             procedure, the patient's blood pressure, pulse, and                            oxygen saturations were monitored continuously. The                            GIF-H190 ([id number] Olympus gastroscope was                            introduced through the mouth, and advanced to the                            second part of duodenum. The upper GI endoscopy was                            accomplished without difficulty. The patient                            tolerated the procedure well. Scope In: Scope Out: Findings:      One benign-appearing, intrinsic moderate (circumferential scarring or       stenosis; an endoscope may pass) stenosis was found at the       cricopharyngeus. The stenosis was traversed. A TTS dilator was passed       through the scope. Dilation with an 18-19-20 mm balloon dilator was       performed to 20 mm. The dilation site was examined and showed moderate       improvement in luminal narrowing. Estimated blood loss: none.      A 6 cm hiatal hernia was present.      The exam was otherwise without abnormality.      The gastroesophageal flap valve was visualized endoscopically and       classified as Hill Grade IV (no fold, wide open lumen, hiatal hernia       present).      The cardia and gastric fundus were normal on retroflexion. Impression:               - Benign-appearing esophageal stenosis. Dilated.                            Stricture at UES - initially somewhat  difficult to                            pass scope. No effect w/ 10-12 dilator. 15-18 used                            retrograde to dilate. Suspect post XRT procedure.                           - 6 cm hiatal hernia. No paraesophageal component                            seen asshown on UGI                           - The examination was otherwise normal.                           - Gastroesophageal flap valve classified as Hill                            Grade IV (no fold, wide open lumen,  hiatal hernia                            present).                           - No specimens collected. Recommendation:           - Patient has a contact number available for                            emergencies. The signs and symptoms of potential                            delayed complications were discussed with the                            patient. Return to normal activities tomorrow.                            Written discharge instructions were provided to the                            patient.                           - Resume previous diet.                           - Continue present medications.                           TRANSESOPHAGEAL ECHOCARDIOGRAPHY WAS SUCCESSFULLY                            PERFORMED AFTER THIS PROCEDURE Procedure  Code(s):        --- Professional ---                           [phone], Esophagogastroduodenoscopy, flexible,                            transoral; with transendoscopic balloon dilation of                            esophagus (less than 30 mm diameter) Diagnosis Code(s):        --- Professional ---                           K22.2, Esophageal obstruction                           K44.9, Diaphragmatic hernia without obstruction or                            gangrene CPT copyright 2019 American Medical Association. All rights reserved. The codes documented in this report are preliminary and upon coder review may  be revised to meet current compliance requirements. Gatha Mayer, MD 04/13/2021 7:19:15 AM This report has been signed electronically. Number of Addenda: 0

## 2021-04-13 NOTE — Telephone Encounter (Signed)
Patient's daughter called on call MD for cold sweats and abdominal pain this am.  Patient is s/p endo dil and TEE yesterday.  Patient's daughter received a call from the patient's wife that patient was weak, diaphoretic, and abdominal pain this am.  Cristian Nunez arrived at the patient's home.  Patient was weak and having lower abdominal pains that did progress to several episodes of diarrhea.  Patient denies CP, SOB, dysphagia, or any upper GI symptoms.  Cristian Nunez is an Therapist, sports and she assessed the patient and believes he is just mildly dehydrated and that he is feeling much better than when she placed the call this am.  She has encouraged him to drink water and gatorade and he is feeling much better.  He continues to deny any upper GI symptoms and tolerating the liquids without any difficulty.  Cristian Nunez will call back if she has any additional concerns.  She thanked me for the call.

## 2021-04-17 NOTE — Progress Notes (Signed)
Cristian Nunez DOB May 06, 1946  This looks like a clippable valve. The fossa looks approachable for transseptal puncture in both the SAXB and Bicaval views. LA dimensions are large enough for device steering and straddle. There appears to be 2 jets; One is near the lateral commissure and the other is on the medial A2/P2- A3/P3. Both leaflets are prolapsed. The posterior leaflet is measures 1.8 cm in the 128 LVOT grasping view. MVA was not not provided. Gradient measures 1 mmHg. I'd like to verify MVA in the procedure prior to the start. Based on this information, I'd recommend an XTW as long as we can achieve enough height above the valve and go for the medial jet.  Thank you for uploading!

## 2021-04-18 ENCOUNTER — Other Ambulatory Visit: Payer: Self-pay

## 2021-04-18 DIAGNOSIS — I7300 Raynaud's syndrome without gangrene: Secondary | ICD-10-CM | POA: Insufficient documentation

## 2021-04-18 DIAGNOSIS — C73 Malignant neoplasm of thyroid gland: Secondary | ICD-10-CM | POA: Insufficient documentation

## 2021-04-18 DIAGNOSIS — L4050 Arthropathic psoriasis, unspecified: Secondary | ICD-10-CM | POA: Insufficient documentation

## 2021-04-25 ENCOUNTER — Ambulatory Visit (INDEPENDENT_AMBULATORY_CARE_PROVIDER_SITE_OTHER): Payer: Medicare Other | Admitting: Cardiology

## 2021-04-25 ENCOUNTER — Telehealth: Payer: Self-pay

## 2021-04-25 ENCOUNTER — Encounter: Payer: Self-pay | Admitting: Cardiology

## 2021-04-25 ENCOUNTER — Other Ambulatory Visit: Payer: Self-pay

## 2021-04-25 VITALS — BP 140/76 | HR 62 | Ht 67.0 in | Wt 158.4 lb

## 2021-04-25 DIAGNOSIS — I051 Rheumatic mitral insufficiency: Secondary | ICD-10-CM

## 2021-04-25 DIAGNOSIS — I341 Nonrheumatic mitral (valve) prolapse: Secondary | ICD-10-CM

## 2021-04-25 DIAGNOSIS — K222 Esophageal obstruction: Secondary | ICD-10-CM | POA: Diagnosis not present

## 2021-04-25 DIAGNOSIS — I10 Essential (primary) hypertension: Secondary | ICD-10-CM

## 2021-04-25 DIAGNOSIS — E782 Mixed hyperlipidemia: Secondary | ICD-10-CM

## 2021-04-25 NOTE — Patient Instructions (Signed)

## 2021-04-25 NOTE — Progress Notes (Signed)
Cardiology Office Note:    Date:  04/25/2021   ID:  Cristian Nunez, DOB [birth year], MRN [id number]  PCP:  Renaldo Reel, PA  Cardiologist:  Jenean Lindau, MD   Referring MD: Renaldo Reel, PA    ASSESSMENT:    1. Mitral valve prolapse   2. Rheumatic mitral regurgitation   3. Essential hypertension   4. Mixed hyperlipidemia   5. Primary hypertension   6. Mixed dyslipidemia   7. Esophageal stricture    PLAN:    In order of problems listed above:  1. Primary prevention stressed to the patient.  Importance of compliance with diet medication stressed any vocalized understanding. 2. Essential hypertension: Blood pressure stable and diet was emphasized.  Patient walks about 2 miles a day without any problems I told him to continue doing this at least 5 days a week and he promises to do so. 3. Mitral valve regurgitation with prolapse: Managed by our structural heart colleagues.  They have deferred this evaluation to November as the patient is asymptomatic and I respect their opinion and I told the patient to follow-up with them in November. 4. Mixed dyslipidemia: Diet was emphasized.  Patient is on statin therapy followed by primary care. 5. Patient will be seen in follow-up appointment in 6 months or earlier if the patient has any concerns    Medication Adjustments/Labs and Tests Ordered: Current medicines are reviewed at length with the patient today.  Concerns regarding medicines are outlined above.  No orders of the defined types were placed in this encounter.  No orders of the defined types were placed in this encounter.    No chief complaint on file.    History of Present Illness:    Cristian Nunez is a 75 y.o. male.  Patient has past medical history of mitral valve prolapse with significant regurgitation, essential hypertension dyslipidemia.  Patient was evaluated by our structural heart colleagues.  Patient had a esophageal stricture therefore TEE was deferred.   Patient notes has had stricture dilation and doing well.  He denies any chest pain orthopnea or PND.  He feels a significant difference in his eating habits after the dilation [place] and happy with it.  His wife accompanies him for this visit.  At the time of my evaluation, the patient is alert awake oriented and in no distress.  Past Medical History:  Diagnosis Date  . Abnormal EKG 12/09/2018  . Arthritis    psoriatic  . Esophageal stricture   . Essential hypertension 12/09/2018  . GERD (gastroesophageal reflux disease)   . History of thyroid cancer    thyroid  . Hyperlipidemia   . Hypertension   . 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 Mitral regurgitation 11/18/2019  . Mitral valve prolapse   . Mixed dyslipidemia 12/09/2018  . Psoriatic arthritis ([place])   . Raynaud's syndrome   . Thyroid cancer [facility])     Past Surgical History:  Procedure Laterality Date  . BALLOON DILATION N/A 04/12/2021   Procedure: BALLOON DILATION;  Surgeon: Gatha Mayer, MD;  Location: [facility] ENDOSCOPY;  Service: Endoscopy;  Laterality: N/A;  . COLONOSCOPY    . ESOPHAGOGASTRODUODENOSCOPY (EGD) WITH PROPOFOL N/A 04/12/2021   Procedure: ESOPHAGOGASTRODUODENOSCOPY (EGD) WITH PROPOFOL;  Surgeon: Gatha Mayer, MD;  Location: [place];  Service: Endoscopy;  Laterality: N/A;  needs fluoro  . INGUINAL HERNIA REPAIR Left 01/28/2019   Procedure: LEFT INGUINAL HERNIA REPAIR WITH MESH;  Surgeon: Rolm Bookbinder, MD;  Location:  [facility] OR;  Service: General;  Laterality: Left;  GENERAL ANESTHESIA AND TAP BLOCK  . KNEE ARTHROSCOPY Left   . RIGHT/LEFT HEART CATH AND CORONARY ANGIOGRAPHY N/A 02/03/2021   Procedure: RIGHT/LEFT HEART CATH AND CORONARY ANGIOGRAPHY;  Surgeon: Sherren Mocha, MD;  Location: [place] CV LAB;  Service: Cardiovascular;  Laterality: N/A;  . TEE WITHOUT CARDIOVERSION N/A 04/12/2021   Procedure: TRANSESOPHAGEAL ECHOCARDIOGRAM (TEE);  Surgeon: Werner Lean, MD;  Location: [facility] ENDOSCOPY;   Service: Cardiovascular;  Laterality: N/A;  . THYROIDECTOMY, PARTIAL    . UMBILICAL HERNIA REPAIR    . VARICOSE VEIN SURGERY Right     Current Medications: Current Meds  Medication Sig  . acetaminophen (TYLENOL) 500 MG tablet Take 500 mg by mouth every 6 (six) hours as needed for moderate pain.  Marland Kitchen amLODipine (NORVASC) 5 MG tablet Take 5 mg by mouth daily.  Marland Kitchen aspirin-acetaminophen-caffeine (EXCEDRIN MIGRAINE) 250-250-65 MG tablet Take 1 tablet by mouth every 6 (six) hours as needed for headache.  . b complex vitamins tablet Take 1 tablet by mouth daily.  . Biotin 5 MG CAPS Take 5 mg by mouth daily.  . clindamycin (CLEOCIN T) 1 % lotion Apply 1 application topically daily as needed (irritation).  . clobetasol ointment (TEMOVATE) 0.73 % Apply 1 application topically 2 (two) times daily as needed (irritation).  . fexofenadine (ALLEGRA) 180 MG tablet Take 180 mg by mouth daily as needed for allergies. During the Spring  . ketoconazole (NIZORAL) 2 % shampoo Apply 1 application topically every 30 (thirty) days.  Marland Kitchen levothyroxine (SYNTHROID) 50 MCG tablet Take 50 mcg by mouth every other day.  . levothyroxine (SYNTHROID) 75 MCG tablet Take 75 mcg by mouth every other day.  . methocarbamol (ROBAXIN) 500 MG tablet Take 500 mg by mouth every 12 (twelve) hours as needed for muscle pain. for pain  . omeprazole (PRILOSEC) 20 MG capsule Take 20 mg by mouth daily.  . pseudoephedrine (SUDAFED) 30 MG tablet Take 30 mg by mouth daily as needed for congestion.  . rosuvastatin (CRESTOR) 10 MG tablet Take 10 mg by mouth daily.     Allergies:   Patient has no known allergies.   Social History   Socioeconomic History  . Marital status: Married    Spouse name: Not on file  . Number of children: Not on file  . Years of education: Not on file  . Highest education level: Not on file  Occupational History  . Occupation: retired    Comment: Software engineer  Tobacco Use  . Smoking status: Never Smoker  .  Smokeless tobacco: Never Used  Vaping Use  . Vaping Use: Never used  Substance and Sexual Activity  . Alcohol use: Yes    Alcohol/week: 14.0 - 21.0 standard drinks    Types: 14 - 21 Cans of beer per week  . Drug use: Never  . Sexual activity: Not on file  Other Topics Concern  . 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 working for [employer] in [employer]   4 daughters 1 daughter Harland German is the Surveyor, quantity in charge of short stay at [employer] [facility]   Never smoker 2 beers a day no caffeine no drug use no other tobacco   Social Determinants of Health   Financial Resource Strain: Not on file  Food Insecurity: Not on file  Transportation Needs: Not on file  Physical Activity: Not on  file  Stress: Not on file  Social Connections: Not on file     Family History: The patient's family history includes Arthritis in his mother; Crohn's disease in his sister; Heart Problems in his maternal grandmother; Heart attack in his maternal grandfather; Hypertension in his mother; Leukemia in his brother; Skin cancer in his sister; Stroke in his father.  ROS:   Please see the history of present illness.    All other systems reviewed and are negative.  EKGs/Labs/Other Studies Reviewed:    The following studies were reviewed today: I discussed my findings with the patient at length and previous evaluations by the specialist were reviewed   Recent Labs: 06/23/2020: NT-Pro BNP 480 04/06/2021: BUN 14; Creatinine, Ser 0.94; Hemoglobin 14.3; Platelets 183; Potassium 4.9; Sodium 136  Recent Lipid Panel    Component Value Date/Time   CHOL 145 05/20/2019 1017   TRIG 96 05/20/2019 1017   HDL 46 05/20/2019 1017   CHOLHDL 3.2 05/20/2019 1017   LDLCALC 80 05/20/2019 1017    Physical Exam:    VS:  BP 140/76   Pulse 62   Ht 5\' 7"  (1.702 m)   Wt 158 lb 6.4 oz (71.8 kg)   SpO2 96%   BMI 24.81 kg/m     Wt  Readings from Last 3 Encounters:  04/25/21 158 lb 6.4 oz (71.8 kg)  04/12/21 158 lb (71.7 kg)  03/23/21 155 lb (70.3 kg)     GEN: Patient is in no acute distress HEENT: Normal NECK: No JVD; No carotid bruits LYMPHATICS: No lymphadenopathy CARDIAC: Hear sounds regular, 2/6 systolic murmur at the apex. RESPIRATORY:  Clear to auscultation without rales, wheezing or rhonchi  ABDOMEN: Soft, non-tender, non-distended MUSCULOSKELETAL:  No edema; No deformity  SKIN: Warm and dry NEUROLOGIC:  Alert and oriented x 3 PSYCHIATRIC:  Normal affect   Signed, Jenean Lindau, MD  04/25/2021 11:39 AM    [facility]

## 2021-04-25 NOTE — Telephone Encounter (Signed)
Sherren Mocha, MD  Rexene Alberts, MD; Barkley Boards, RN; Revankar, Reita Cliche, MD; Theodoro Parma, RN I know we talked about this but wanted to respond for Aspasia Rude. I agree - we should continue to observe him and follow with serial echo studies. He sees Dr Geraldo Pitter in follow-up this week and I can see him in valve clinic with a follow-up echo in 6 months.        Previous Messages   ----- Message -----  From: Rexene Alberts, MD  Sent: 04/13/2021  3:35 PM EDT  To: Barkley Boards, RN, Sherren Mocha, MD   I read Carl's note and looked at TEE. 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 There are no ruptured chords, no flail segment. I'm inclined to recommend close observation if he is really stage C. What do you think Ronalee Belts?   ----- Message -----  From: Barkley Boards, RN  Sent: 04/13/2021  1:04 PM EDT  To: Rexene Alberts, MD, Sherren Mocha, MD   Hey,   Mr Mentzer underwent successful esophageal dilitation and TEE yesterday for evaluation of severe MR (previous TEE aborted due to being unable to pass probe). Please review and let me know next steps.    Thanks,  Adesuwa Osgood        I contacted the pt and made him aware of recommendations from Dr Burt Knack and Dr Roxy Manns.  The pt is scheduled for routine cardiology follow-up with Dr Lennox Pippins today.  We will plan to contact the pt to arrange echocardiogram and follow-up in November with Dr Burt Knack. Pt verbalized understanding and agreed with plan.

## 2021-05-01 ENCOUNTER — Ambulatory Visit: Payer: Medicare Other

## 2021-05-01 NOTE — Progress Notes (Signed)
   Covid-19 Vaccination Clinic  Name:  TYQUAN CARMICKLE    MRN: [id number] DOB: 1946/01/26  05/01/2021  Mr. Vanderburg was observed post Covid-19 immunization for 15 minutes without incident. He was provided with Vaccine Information Sheet and instruction to access the V-Safe system.   Mr. Stahl was instructed to call 911 with any severe reactions post vaccine: Marland Kitchen Difficulty breathing  . Swelling of face and throat  . A fast heartbeat  . A bad rash all over body  . Dizziness and weakness

## 2021-05-03 ENCOUNTER — Ambulatory Visit: Payer: Medicare Other | Admitting: Internal Medicine

## 2021-05-04 ENCOUNTER — Ambulatory Visit: Payer: Medicare Other

## 2021-05-09 ENCOUNTER — Other Ambulatory Visit (HOSPITAL_BASED_OUTPATIENT_CLINIC_OR_DEPARTMENT_OTHER): Payer: Self-pay

## 2021-05-09 DIAGNOSIS — Z9181 History of falling: Secondary | ICD-10-CM | POA: Diagnosis not present

## 2021-05-09 DIAGNOSIS — Z0000 Encounter for general adult medical examination without abnormal findings: Secondary | ICD-10-CM | POA: Diagnosis not present

## 2021-05-09 DIAGNOSIS — E785 Hyperlipidemia, unspecified: Secondary | ICD-10-CM | POA: Diagnosis not present

## 2021-05-09 DIAGNOSIS — Z1331 Encounter for screening for depression: Secondary | ICD-10-CM | POA: Diagnosis not present

## 2021-05-09 DIAGNOSIS — Z23 Encounter for immunization: Secondary | ICD-10-CM | POA: Diagnosis not present

## 2021-05-09 MED ORDER — PFIZER-BIONT COVID-19 VAC-TRIS 30 MCG/0.3ML IM SUSP
INTRAMUSCULAR | 0 refills | Status: DC
  Filled 2021-05-09: qty 0.3, 1d supply, fill #0

## 2021-05-18 ENCOUNTER — Telehealth: Payer: Self-pay

## 2021-05-18 NOTE — Telephone Encounter (Signed)
Scheduled the patient's 6 month echo and Valve Clinic visit on 11/16. He understands to arrive at 1005 for echo at 1020 and subsequent visit with Dr. Burt Knack at 1120. He was grateful for call and agrees with plan.

## 2021-06-30 DIAGNOSIS — K219 Gastro-esophageal reflux disease without esophagitis: Secondary | ICD-10-CM | POA: Diagnosis not present

## 2021-06-30 DIAGNOSIS — Z1331 Encounter for screening for depression: Secondary | ICD-10-CM | POA: Diagnosis not present

## 2021-06-30 DIAGNOSIS — I341 Nonrheumatic mitral (valve) prolapse: Secondary | ICD-10-CM | POA: Diagnosis not present

## 2021-06-30 DIAGNOSIS — E782 Mixed hyperlipidemia: Secondary | ICD-10-CM | POA: Diagnosis not present

## 2021-06-30 DIAGNOSIS — L4050 Arthropathic psoriasis, unspecified: Secondary | ICD-10-CM | POA: Diagnosis not present

## 2021-06-30 DIAGNOSIS — Z9181 History of falling: Secondary | ICD-10-CM | POA: Diagnosis not present

## 2021-06-30 DIAGNOSIS — M05749 Rheumatoid arthritis with rheumatoid factor of unspecified hand without organ or systems involvement: Secondary | ICD-10-CM | POA: Diagnosis not present

## 2021-06-30 DIAGNOSIS — E039 Hypothyroidism, unspecified: Secondary | ICD-10-CM | POA: Diagnosis not present

## 2021-06-30 DIAGNOSIS — M549 Dorsalgia, unspecified: Secondary | ICD-10-CM | POA: Diagnosis not present

## 2021-06-30 DIAGNOSIS — G8929 Other chronic pain: Secondary | ICD-10-CM | POA: Diagnosis not present

## 2021-06-30 DIAGNOSIS — I10 Essential (primary) hypertension: Secondary | ICD-10-CM | POA: Diagnosis not present

## 2021-06-30 DIAGNOSIS — Z125 Encounter for screening for malignant neoplasm of prostate: Secondary | ICD-10-CM | POA: Diagnosis not present

## 2021-06-30 DIAGNOSIS — Z139 Encounter for screening, unspecified: Secondary | ICD-10-CM | POA: Diagnosis not present

## 2021-07-31 DIAGNOSIS — M79645 Pain in left finger(s): Secondary | ICD-10-CM | POA: Diagnosis not present

## 2021-07-31 DIAGNOSIS — S61209A Unspecified open wound of unspecified finger without damage to nail, initial encounter: Secondary | ICD-10-CM | POA: Diagnosis not present

## 2021-07-31 DIAGNOSIS — M79644 Pain in right finger(s): Secondary | ICD-10-CM | POA: Diagnosis not present

## 2021-07-31 DIAGNOSIS — Z6824 Body mass index (BMI) 24.0-24.9, adult: Secondary | ICD-10-CM | POA: Diagnosis not present

## 2021-08-10 DIAGNOSIS — M79645 Pain in left finger(s): Secondary | ICD-10-CM | POA: Diagnosis not present

## 2021-08-14 ENCOUNTER — Other Ambulatory Visit: Payer: Self-pay

## 2021-08-14 ENCOUNTER — Emergency Department (HOSPITAL_COMMUNITY)
Admission: EM | Admit: 2021-08-14 | Discharge: 2021-08-14 | Disposition: A | Discharge status: Home / Self Care | Payer: Medicare Other | Attending: Emergency Medicine | Admitting: Emergency Medicine

## 2021-08-14 ENCOUNTER — Emergency Department (HOSPITAL_COMMUNITY): Payer: Medicare Other

## 2021-08-14 ENCOUNTER — Encounter (HOSPITAL_COMMUNITY): Payer: Self-pay | Admitting: Radiology

## 2021-08-14 DIAGNOSIS — R110 Nausea: Secondary | ICD-10-CM | POA: Diagnosis not present

## 2021-08-14 DIAGNOSIS — R009 Unspecified abnormalities of heart beat: Secondary | ICD-10-CM | POA: Diagnosis not present

## 2021-08-14 DIAGNOSIS — K5900 Constipation, unspecified: Secondary | ICD-10-CM

## 2021-08-14 DIAGNOSIS — Z79899 Other long term (current) drug therapy: Secondary | ICD-10-CM | POA: Insufficient documentation

## 2021-08-14 DIAGNOSIS — Z7982 Long term (current) use of aspirin: Secondary | ICD-10-CM | POA: Diagnosis not present

## 2021-08-14 DIAGNOSIS — Z85850 Personal history of malignant neoplasm of thyroid: Secondary | ICD-10-CM | POA: Insufficient documentation

## 2021-08-14 DIAGNOSIS — R339 Retention of urine, unspecified: Secondary | ICD-10-CM | POA: Diagnosis not present

## 2021-08-14 DIAGNOSIS — I10 Essential (primary) hypertension: Secondary | ICD-10-CM | POA: Diagnosis not present

## 2021-08-14 DIAGNOSIS — N3289 Other specified disorders of bladder: Secondary | ICD-10-CM | POA: Diagnosis not present

## 2021-08-14 DIAGNOSIS — E039 Hypothyroidism, unspecified: Secondary | ICD-10-CM | POA: Insufficient documentation

## 2021-08-14 DIAGNOSIS — R109 Unspecified abdominal pain: Secondary | ICD-10-CM | POA: Diagnosis not present

## 2021-08-14 DIAGNOSIS — N281 Cyst of kidney, acquired: Secondary | ICD-10-CM | POA: Diagnosis not present

## 2021-08-14 DIAGNOSIS — M549 Dorsalgia, unspecified: Secondary | ICD-10-CM | POA: Diagnosis not present

## 2021-08-14 DIAGNOSIS — I700 Atherosclerosis of aorta: Secondary | ICD-10-CM | POA: Diagnosis not present

## 2021-08-14 DIAGNOSIS — K449 Diaphragmatic hernia without obstruction or gangrene: Secondary | ICD-10-CM | POA: Diagnosis not present

## 2021-08-14 LAB — COMPREHENSIVE METABOLIC PANEL
ALT: 29 U/L (ref 0–44)
AST: 37 U/L (ref 15–41)
Albumin: 4.6 g/dL (ref 3.5–5.0)
Alkaline Phosphatase: 65 U/L (ref 38–126)
Anion gap: 14 (ref 5–15)
BUN: 16 mg/dL (ref 8–23)
CO2: 21 mmol/L — ABNORMAL LOW (ref 22–32)
Calcium: 9.3 mg/dL (ref 8.9–10.3)
Chloride: 93 mmol/L — ABNORMAL LOW (ref 98–111)
Creatinine, Ser: 0.72 mg/dL (ref 0.61–1.24)
GFR, Estimated: >60 mL/min (ref >60)
Glucose, Bld: 163 mg/dL — ABNORMAL HIGH (ref 70–99)
Potassium: 3.7 mmol/L (ref 3.5–5.1)
Sodium: 128 mmol/L — ABNORMAL LOW (ref 135–145)
Total Bilirubin: 0.8 mg/dL (ref 0.3–1.2)
Total Protein: 7.2 g/dL (ref 6.5–8.1)

## 2021-08-14 LAB — CBC WITH DIFFERENTIAL/PLATELET
Abs Immature Granulocytes: 0.04 10*3/uL (ref 0.00–0.07)
Basophils Absolute: 0 10*3/uL (ref 0.0–0.1)
Basophils Relative: 0 %
Eosinophils Absolute: 0 10*3/uL (ref 0.0–0.5)
Eosinophils Relative: 0 %
HCT: 44.4 % (ref 39.0–52.0)
Hemoglobin: 15.3 g/dL (ref 13.0–17.0)
Immature Granulocytes: 0 %
Lymphocytes Relative: 7 %
Lymphs Abs: 0.9 10*3/uL (ref 0.7–4.0)
MCH: 31.5 pg (ref 26.0–34.0)
MCHC: 34.5 g/dL (ref 30.0–36.0)
MCV: 91.5 fL (ref 80.0–100.0)
Monocytes Absolute: 0.6 10*3/uL (ref 0.1–1.0)
Monocytes Relative: 5 %
Neutro Abs: 10.8 10*3/uL — ABNORMAL HIGH (ref 1.7–7.7)
Neutrophils Relative %: 88 %
Platelets: 181 10*3/uL (ref 150–400)
RBC: 4.85 MIL/uL (ref 4.22–5.81)
RDW: 12.2 % (ref 11.5–15.5)
WBC: 12.4 10*3/uL — ABNORMAL HIGH (ref 4.0–10.5)
nRBC: 0 % (ref 0.0–0.2)

## 2021-08-14 LAB — URINALYSIS, ROUTINE W REFLEX MICROSCOPIC
Bacteria, UA: NONE SEEN
Bilirubin Urine: NEGATIVE
Glucose, UA: >=500 mg/dL — AB
Ketones, ur: 5 mg/dL — AB
Leukocytes,Ua: NEGATIVE
Nitrite: NEGATIVE
Protein, ur: NEGATIVE mg/dL
Specific Gravity, Urine: 1.007 (ref 1.005–1.030)
pH: 7 (ref 5.0–8.0)

## 2021-08-14 MED ORDER — SODIUM CHLORIDE 0.9 % IV BOLUS: 500.0000 mL | Freq: Once | INTRAVENOUS | Status: DC

## 2021-08-14 MED ORDER — IOHEXOL 350 MG/ML SOLN
75.0000 mL | Freq: Once | INTRAVENOUS | Status: AC | PRN
  Administered 2021-08-14: 75 mL via INTRAVENOUS

## 2021-08-14 NOTE — ED Notes (Signed)
This RN attempted x2 for IV access, consulting 2nd RN for access at this time.

## 2021-08-14 NOTE — ED Notes (Signed)
Pt ambulating to RR at this time.

## 2021-08-14 NOTE — ED Notes (Signed)
In and out cath done at this time. Pt tolerated well. Cleared for discharge.

## 2021-08-14 NOTE — ED Provider Notes (Signed)
[facility] EMERGENCY DEPARTMENT Provider Note   CSN: [id number] Arrival date & time: 08/14/21  [id number]     History Chief Complaint  Patient presents with   Constipation    Cristian Nunez is a 75 y.o. male.   Constipation Associated symptoms: back pain and nausea   Associated symptoms: no fever   Patient presents with constipation and some abdominal pain.  States last bowel movement was couple days ago.  Has had some nausea no vomiting.  Pain in the abdomen.  States she is trying to urinate and really not going and not able to have a bowel movement either.  States he strains hard and nothing comes.  Does struggle with constipation somewhat.  Pain is in the lower abdomen.  States he does not feel as if he has to urinate severely.  No fevers or chills.  Abdomen is not necessarily larger than baseline. has previous hernia repair.    Past Medical History:  Diagnosis Date   Abnormal EKG 12/09/2018   Arthritis    psoriatic   Esophageal stricture    Essential hypertension 12/09/2018   GERD (gastroesophageal reflux disease)    History of thyroid cancer    thyroid   Hyperlipidemia    Hypertension    Hypothyroidism    90% thyroidectomy 1973   Mitral regurgitation 11/18/2019   Mitral valve prolapse    Mixed dyslipidemia 12/09/2018   Psoriatic arthritis ([place])    Raynaud's syndrome    Thyroid cancer [facility])     Patient Active Problem List   Diagnosis Date Noted   Psoriatic arthritis ([place])    Raynaud's syndrome    Thyroid cancer ([place])    Esophageal stricture    Arthritis    History of thyroid cancer    GERD (gastroesophageal reflux disease)    Hyperlipidemia    Hypertension    Hypothyroidism    Mitral valve prolapse 11/18/2019   Mitral regurgitation 11/18/2019   Essential hypertension 12/09/2018   Mixed dyslipidemia 12/09/2018   Abnormal EKG 12/09/2018    Past Surgical History:  Procedure Laterality Date   BALLOON DILATION N/A 04/12/2021   Procedure:  BALLOON DILATION;  Surgeon: Gatha Mayer, MD;  Location: [facility] ENDOSCOPY;  Service: Endoscopy;  Laterality: N/A;   COLONOSCOPY     ESOPHAGOGASTRODUODENOSCOPY (EGD) WITH PROPOFOL N/A 04/12/2021   Procedure: ESOPHAGOGASTRODUODENOSCOPY (EGD) WITH PROPOFOL;  Surgeon: Gatha Mayer, MD;  Location: [place];  Service: Endoscopy;  Laterality: N/A;  needs fluoro   INGUINAL HERNIA REPAIR Left 01/28/2019   Procedure: LEFT INGUINAL HERNIA REPAIR WITH MESH;  Surgeon: Rolm Bookbinder, MD;  Location: [place];  Service: General;  Laterality: Left;  GENERAL ANESTHESIA AND TAP BLOCK   KNEE ARTHROSCOPY Left    RIGHT/LEFT HEART CATH AND CORONARY ANGIOGRAPHY N/A 02/03/2021   Procedure: RIGHT/LEFT HEART CATH AND CORONARY ANGIOGRAPHY;  Surgeon: Sherren Mocha, MD;  Location: [place] CV LAB;  Service: Cardiovascular;  Laterality: N/A;   TEE WITHOUT CARDIOVERSION N/A 04/12/2021   Procedure: TRANSESOPHAGEAL ECHOCARDIOGRAM (TEE);  Surgeon: Werner Lean, MD;  Location: [facility] ENDOSCOPY;  Service: Cardiovascular;  Laterality: N/A;   THYROIDECTOMY, PARTIAL     UMBILICAL HERNIA REPAIR     VARICOSE VEIN SURGERY Right        Family History  Problem Relation Age of Onset   Hypertension Mother    Arthritis Mother    Heart Problems Maternal Grandmother    Stroke Father    Crohn's disease Sister    Skin cancer  Sister    Leukemia Brother    Heart attack Maternal Grandfather     Social History   Tobacco Use   Smoking status: Never   Smokeless tobacco: Never  Vaping Use   Vaping Use: Never used  Substance Use Topics   Alcohol use: Yes    Alcohol/week: 14.0 - 21.0 standard drinks    Types: 14 - 21 Cans of beer per week   Drug use: Never    Home Medications Prior to Admission medications   Medication Sig Start Date End Date Taking? Authorizing Provider  acetaminophen (TYLENOL) 500 MG tablet Take 500 mg by mouth every 6 (six) hours as needed for moderate pain.    [provider]   amLODipine (NORVASC) 5 MG tablet Take 5 mg by mouth daily. 07/21/20   [provider]  aspirin-acetaminophen-caffeine (EXCEDRIN MIGRAINE) [phone] MG tablet Take 1 tablet by mouth every 6 (six) hours as needed for headache.    [provider]  b complex vitamins tablet Take 1 tablet by mouth daily.    [provider]  Biotin 5 MG CAPS Take 5 mg by mouth daily.    [provider]  clindamycin (CLEOCIN T) 1 % lotion Apply 1 application topically daily as needed (irritation). 07/31/19   [provider]  clobetasol ointment (TEMOVATE) [id number] % Apply 1 application topically 2 (two) times daily as needed (irritation). 09/15/19   [provider]  COVID-19 mRNA Vac-TriS, Pfizer, (PFIZER-BIONT COVID-19 VAC-TRIS) SUSP injection Inject into the muscle. 05/01/21   Carlyle Basques, MD  fexofenadine (ALLEGRA) 180 MG tablet Take 180 mg by mouth daily as needed for allergies. During the Spring    [provider]  ketoconazole (NIZORAL) 2 % shampoo Apply 1 application topically every 30 (thirty) days. 01/08/19   [provider]  levothyroxine (SYNTHROID) 50 MCG tablet Take 50 mcg by mouth every other day.    [provider]  levothyroxine (SYNTHROID) 75 MCG tablet Take 75 mcg by mouth every other day.    [provider]  methocarbamol (ROBAXIN) 500 MG tablet Take 500 mg by mouth every 12 (twelve) hours as needed for muscle pain. for pain 08/20/18   [provider]  omeprazole (PRILOSEC) 20 MG capsule Take 20 mg by mouth daily. 06/22/20   [provider]  pseudoephedrine (SUDAFED) 30 MG tablet Take 30 mg by mouth daily as needed for congestion.    [provider]  rosuvastatin (CRESTOR) 10 MG tablet Take 10 mg by mouth daily.    [provider]    Allergies    Patient has no known allergies.  Review of Systems   Review of Systems  Constitutional:  Negative for appetite change and fever.   HENT:  Negative for congestion.   Respiratory:  Negative for shortness of breath.   Cardiovascular:  Negative for chest pain.  Gastrointestinal:  Positive for constipation and nausea.  Genitourinary:  Positive for difficulty urinating.  Musculoskeletal:  Positive for back pain.  Skin:  Negative for rash.  Neurological:  Negative for weakness.  Psychiatric/Behavioral:  Negative for confusion.    Physical Exam Updated Vital Signs BP (!) 153/88 (BP Location: Right Arm)   Pulse 84   Temp 98.1 F (36.7 C) (Oral)   Resp 15   SpO2 99%   Physical Exam Vitals and nursing note reviewed.  HENT:     Head: Normocephalic.  Eyes:     Pupils: Pupils are equal, round, and reactive to light.  Cardiovascular:  Rate and Rhythm: Rhythm irregular.  Abdominal:     Tenderness: There is abdominal tenderness.     Comments: Abdominal tenderness.  Worse in the lower abdomen.  No rebound or guarding.  No hernia palpated.  Genitourinary:    Comments: Large amount of soft stool in rectum.  Broken up.  Patient began to urinate well breaking up the stool. Musculoskeletal:     Cervical back: Neck supple.  Skin:    General: Skin is warm.     Capillary Refill: Capillary refill takes less than 2 seconds.  Neurological:     Mental Status: He is alert and oriented to person, place, and time.    ED Results / Procedures / Treatments   Labs (all labs ordered are listed, but only abnormal results are displayed) Labs Reviewed  URINALYSIS, ROUTINE W REFLEX MICROSCOPIC - Abnormal; Notable for the following components:      Result Value   Glucose, UA >=500 (*)    Hgb urine dipstick SMALL (*)    Ketones, ur 5 (*)    All other components within normal limits  COMPREHENSIVE METABOLIC PANEL - Abnormal; Notable for the following components:   Sodium 128 (*)    Chloride 93 (*)    CO2 21 (*)    Glucose, Bld 163 (*)    All other components within normal limits  CBC WITH DIFFERENTIAL/PLATELET - Abnormal;  Notable for the following components:   WBC 12.4 (*)    Neutro Abs 10.8 (*)    All other components within normal limits    EKG None  Radiology DG Abdomen 1 View  Result Date: 08/14/2021 CLINICAL DATA:  Constipation, abdominal pain, recent finger surgery EXAM: ABDOMEN - 1 VIEW COMPARISON:  None. FINDINGS: No dilated small bowel loops. Large colonic stool volume, most prominent in the right and transverse colon. No evidence of pneumatosis or pneumoperitoneum. Clear lung bases. No radiopaque nephrolithiasis. Marked lumbar spondylosis. IMPRESSION: Nonobstructive bowel gas pattern. Large colonic stool volume, most prominent in the right and transverse colon, suggesting constipation. Electronically Signed   By: Ilona Sorrel M.D.   On: 08/14/2021 09:11   CT ABDOMEN PELVIS W CONTRAST  Result Date: 08/14/2021 CLINICAL DATA:  Left lower quadrant pain EXAM: CT ABDOMEN AND PELVIS WITH CONTRAST TECHNIQUE: Multidetector CT imaging of the abdomen and pelvis was performed using the standard protocol following bolus administration of intravenous contrast. CONTRAST:  74m OMNIPAQUE IOHEXOL 350 MG/ML SOLN COMPARISON:  None. FINDINGS: Lower chest: Lung bases demonstrate no acute consolidation or effusion. Normal cardiac size. Coronary vascular calcification. Small hiatal hernia. Hepatobiliary: No calcified gallstone. No focal hepatic abnormality. No biliary dilatation Pancreas: Unremarkable. No pancreatic ductal dilatation or surrounding inflammatory changes. Spleen: Normal in size without focal abnormality. Adrenals/Urinary Tract: Adrenal glands are normal. Kidneys show no hydronephrosis. Cysts in the bilateral kidneys. Subcentimeter hypodensities too small to further characterize. Punctate stones versus early excreted contrast at the lower poles of the kidneys. Bladder is distended Stomach/Bowel: The stomach is nonenlarged. No dilated small bowel. Moderate stool throughout colon. Negative appendix. No acute bowel wall  thickening Vascular/Lymphatic: Advanced aortic atherosclerosis. No aneurysm. No suspicious nodes. Reproductive: Prostate is unremarkable. Other: Negative for free air. Trace fluid within the lower quadrants. Musculoskeletal: No acute osseous abnormality. Postsurgical changes of the lumbar spine. Fusion L2-L3 and L4-L5 vertebral bodies. IMPRESSION: 1. No CT evidence for acute intra-abdominal or pelvic abnormality. Moderate stool burden. 2. Trace free fluid in the lower quadrants. Electronically Signed   By: KMadie RenoD.  On: 08/14/2021 16:01    Procedures Procedures   Medications Ordered in ED Medications  iohexol (OMNIPAQUE) 350 MG/ML injection 75 mL (75 mLs Intravenous Contrast Given 08/14/21 1543)    ED Course  I have reviewed the triage vital signs and the nursing notes.  Pertinent labs & imaging results that were available during my care of the patient were reviewed by me and considered in my medical decision making (see chart for details).    MDM Rules/Calculators/A&P                           Patient presents with constipation abdominal pain.  Has had some issues with constipation in the past.  Last bowel movement 2 days ago.  Also states having some difficulty urinating.  Rectal exam done and showed large amount of soft stool which was broken up on my finger.  Did continue to have some bowel movements.  Initially small but then states he had a couple large bowel movements.  States he also urinated what he considered a normal amount instead of the normal small amounts.  Had had a postvoid residual of around 700.  CT scan done and showed, constipation.  Also large amount of urine in the bladder.  Patient states he is urinated 2 more times since the CAT scan but still had a fair amount of urine in the bladder.  Discussed with patient about In-N-Out cath versus Foley catheter.  We discussed with him and his daughter, who is an Therapist, sports that we feel an In-N-Out cath is hopefully enough since  patient had continued to have bowel movements and hopefully this was a mechanical obstruction from the stool.  Abdominal tenderness much improved from prior.  However was a small amount of free fluid on the CAT scan.  Likely secondary to constipation, discussed with patient and daughter however about follow-up instructions and how the fluid is normally pathologic.  Patient has MiraLAX at home that he will use.  Follow-up with PCP and urology. Final Clinical Impression(s) / ED Diagnoses Final diagnoses:  Constipation, unspecified constipation type  Urinary retention    Rx / DC Orders ED Discharge Orders     None        Davonna Belling, MD 08/15/21 [phone]

## 2021-08-14 NOTE — ED Notes (Signed)
This RN chaperoned EDP for bowel disimpaction, upon completion patient requested to to ambulate to restroom. Patient ambulated with minimal assistance and was provided specimen cup for urine sample.

## 2021-08-14 NOTE — Discharge Instructions (Addendum)
Take the MiraLAX you have at home to help with constipation.  If you feel as if you are having difficulty urina watch for increasing abdominal pain or fevers again you may need to return to the ER or follow the primary care doctor sooner to make sure you are urinating.

## 2021-08-14 NOTE — ED Triage Notes (Signed)
Pt reports last BM two days ago. Has abdominal pain and nausea without vomiting. Also reports straining so hard to have a bowel movement that he now feels like he needs to urinate constantly.

## 2021-08-15 ENCOUNTER — Other Ambulatory Visit (HOSPITAL_BASED_OUTPATIENT_CLINIC_OR_DEPARTMENT_OTHER): Payer: Self-pay

## 2021-08-31 DIAGNOSIS — M79644 Pain in right finger(s): Secondary | ICD-10-CM | POA: Diagnosis not present

## 2021-08-31 DIAGNOSIS — M79645 Pain in left finger(s): Secondary | ICD-10-CM | POA: Diagnosis not present

## 2021-09-21 DIAGNOSIS — M79644 Pain in right finger(s): Secondary | ICD-10-CM | POA: Diagnosis not present

## 2021-09-21 DIAGNOSIS — M79645 Pain in left finger(s): Secondary | ICD-10-CM | POA: Diagnosis not present

## 2021-10-02 DIAGNOSIS — Z23 Encounter for immunization: Secondary | ICD-10-CM | POA: Diagnosis not present

## 2021-10-17 ENCOUNTER — Ambulatory Visit: Payer: Medicare Other | Attending: Internal Medicine

## 2021-10-17 DIAGNOSIS — Z23 Encounter for immunization: Secondary | ICD-10-CM

## 2021-10-17 NOTE — Progress Notes (Signed)
   Covid-19 Vaccination Clinic  Name:  Cristian Nunez    MRN: [id number] DOB: 07/19/1946  10/17/2021  [name] was observed post Covid-19 immunization for 15 minutes without incident. He was provided with Vaccine Information Sheet and instruction to access the V-Safe system.   [name] was instructed to call 911 with any severe reactions post vaccine: Difficulty breathing  Swelling of face and throat  A fast heartbeat  A bad rash all over body  Dizziness and weakness   Immunizations Administered     Name Date Dose VIS Date Route   Pfizer Covid-19 Vaccine Bivalent Booster 10/17/2021 10:57 AM 0.3 mL 08/09/2021 Intramuscular   Manufacturer: [place]   Lot: [id number]   [place]: [phone]

## 2021-10-18 NOTE — Addendum Note (Signed)
Addended by: Harland German A on: 10/18/2021 10:03 AM   Modules accepted: Orders

## 2021-10-25 ENCOUNTER — Other Ambulatory Visit: Payer: Self-pay

## 2021-10-25 ENCOUNTER — Ambulatory Visit (INDEPENDENT_AMBULATORY_CARE_PROVIDER_SITE_OTHER): Payer: Medicare Other | Admitting: Cardiovascular Disease

## 2021-10-25 ENCOUNTER — Ambulatory Visit (HOSPITAL_COMMUNITY): Payer: Medicare Other | Attending: Cardiovascular Disease

## 2021-10-25 ENCOUNTER — Encounter: Payer: Self-pay | Admitting: Cardiovascular Disease

## 2021-10-25 VITALS — BP 120/82 | HR 66 | Ht 68.0 in | Wt 160.2 lb

## 2021-10-25 DIAGNOSIS — I340 Nonrheumatic mitral (valve) insufficiency: Secondary | ICD-10-CM

## 2021-10-25 DIAGNOSIS — I051 Rheumatic mitral insufficiency: Secondary | ICD-10-CM | POA: Diagnosis not present

## 2021-10-25 DIAGNOSIS — I341 Nonrheumatic mitral (valve) prolapse: Secondary | ICD-10-CM | POA: Insufficient documentation

## 2021-10-25 LAB — ECHOCARDIOGRAM COMPLETE
Area-P 1/2: 4.21 cm2
MV M vel: 6.11 m/s
MV Peak grad: 149.3 mmHg
Radius: 0.7 cm
S' Lateral: 4.05 cm

## 2021-10-25 NOTE — Progress Notes (Signed)
Cardiology Office Note:    Date:  10/25/2021   ID:  Cristian Nunez, DOB 06/21/1946, MRN [id number]  PCP:  Renaldo Reel, [facility] Providers Cardiologist:  Sherren Mocha, MD     Referring MD: Renaldo Reel, [place]   Chief Complaint  Patient presents with   Mitral Regurgitation    History of Present Illness:    Cristian Nunez is a 75 y.o. male with a hx of mitral valve prolapse and severe mitral regurgitation presenting for follow-up evaluation.  The patient has been followed since July 2021 when he was first seen for evaluation of severe mitral regurgitation.  He is completely asymptomatic and has been followed with clinical surveillance.  The patient underwent a stress echocardiogram demonstrating fair exercise tolerance at 6 minutes and 30 seconds according to the Bruce protocol his mitral regurgitation was felt to be severe at baseline and worsened with exercise.  The patient's BNP level was 480 which is in the upper limit of normal.  We discussed surgical referral but the patient preferred close follow-up.  He is here with his wife today.  He continues to walk 25 to 30 minutes without any symptoms of dyspnea, chest discomfort, lightheadedness, or heart palpitations.  Past Medical History:  Diagnosis Date   Abnormal EKG 12/09/2018   Arthritis    psoriatic   Esophageal stricture    Essential hypertension 12/09/2018   GERD (gastroesophageal reflux disease)    History of thyroid cancer    thyroid   Hyperlipidemia    Hypertension    Hypothyroidism    90% thyroidectomy 1973   Mitral regurgitation 11/18/2019   Mitral valve prolapse    Mixed dyslipidemia 12/09/2018   Psoriatic arthritis ([place])    Raynaud's syndrome    Thyroid cancer [facility]     Past Surgical History:  Procedure Laterality Date   BALLOON DILATION N/A 04/12/2021   Procedure: BALLOON DILATION;  Surgeon: Gatha Mayer, MD;  Location: [facility] ENDOSCOPY;  Service: Endoscopy;  Laterality: N/A;   COLONOSCOPY      ESOPHAGOGASTRODUODENOSCOPY (EGD) WITH PROPOFOL N/A 04/12/2021   Procedure: ESOPHAGOGASTRODUODENOSCOPY (EGD) WITH PROPOFOL;  Surgeon: Gatha Mayer, MD;  Location: [place];  Service: Endoscopy;  Laterality: N/A;  needs fluoro   INGUINAL HERNIA REPAIR Left 01/28/2019   Procedure: LEFT INGUINAL HERNIA REPAIR WITH MESH;  Surgeon: Rolm Bookbinder, MD;  Location: [place];  Service: General;  Laterality: Left;  GENERAL ANESTHESIA AND TAP BLOCK   KNEE ARTHROSCOPY Left    RIGHT/LEFT HEART CATH AND CORONARY ANGIOGRAPHY N/A 02/03/2021   Procedure: RIGHT/LEFT HEART CATH AND CORONARY ANGIOGRAPHY;  Surgeon: Sherren Mocha, MD;  Location: [place] CV LAB;  Service: Cardiovascular;  Laterality: N/A;   TEE WITHOUT CARDIOVERSION N/A 04/12/2021   Procedure: TRANSESOPHAGEAL ECHOCARDIOGRAM (TEE);  Surgeon: Werner Lean, MD;  Location: [facility] ENDOSCOPY;  Service: Cardiovascular;  Laterality: N/A;   THYROIDECTOMY, PARTIAL     UMBILICAL HERNIA REPAIR     VARICOSE VEIN SURGERY Right     Current Medications: Current Meds  Medication Sig   acetaminophen (TYLENOL) 500 MG tablet Take 500 mg by mouth every 6 (six) hours as needed for moderate pain.   amLODipine (NORVASC) 5 MG tablet Take 5 mg by mouth daily.   amoxicillin (AMOXIL) 500 MG capsule amoxicillin 500 mg capsule  TAKE 1 CAPSULE BY MOUTH THREE TIMES DAILY WITH FOOD   aspirin-acetaminophen-caffeine (EXCEDRIN MIGRAINE) 250-250-65 MG tablet Take 1 tablet by mouth every 6 (six) hours as needed  for headache.   b complex vitamins tablet Take 1 tablet by mouth daily.   clindamycin (CLEOCIN T) 1 % lotion Apply 1 application topically daily as needed (irritation).   clobetasol ointment (TEMOVATE) 9.32 % Apply 1 application topically 2 (two) times daily as needed (irritation).   COVID-19 mRNA Vac-TriS, Pfizer, (PFIZER-BIONT COVID-19 VAC-TRIS) SUSP injection Inject into the muscle.   fexofenadine (ALLEGRA) 180 MG tablet Take 180 mg by mouth daily as needed  for allergies. During the Spring   ketoconazole (NIZORAL) 2 % shampoo Apply 1 application topically every 30 (thirty) days.   levothyroxine (SYNTHROID) 50 MCG tablet Take 50 mcg by mouth every other day.   levothyroxine (SYNTHROID) 75 MCG tablet Take 75 mcg by mouth every other day.   methocarbamol (ROBAXIN) 500 MG tablet Take 500 mg by mouth every 12 (twelve) hours as needed for muscle pain. for pain   omeprazole (PRILOSEC) 20 MG capsule Take 20 mg by mouth daily.   pseudoephedrine (SUDAFED) 30 MG tablet Take 30 mg by mouth daily as needed for congestion.   rosuvastatin (CRESTOR) 10 MG tablet Take 10 mg by mouth daily.     Allergies:   Patient has no known allergies.   Social History   Socioeconomic History   Marital status: Married    Spouse name: Not on file   Number of children: Not on file   Years of education: Not on file   Highest education level: Not on file  Occupational History   Occupation: retired    Comment: Pharmacist  Tobacco Use   Smoking status: Never   Smokeless tobacco: Never  Vaping Use   Vaping Use: Never used  Substance and Sexual Activity   Alcohol use: Yes    Alcohol/week: 14.0 - 21.0 standard drinks    Types: 14 - 21 Cans of beer per week   Drug use: Never   Sexual activity: Not on file  Other Topics Concern   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 working for [employer] in [employer]   4 daughters 1 daughter Harland German is the Surveyor, quantity in charge of short stay at [facility]   Never smoker 2 beers a day no caffeine no drug use no other tobacco   Social Determinants of Radio broadcast assistant Strain: Not on file  Food Insecurity: Not on file  Transportation Needs: Not on file  Physical Activity: Not on file  Stress: Not on file  Social Connections: Not on file     Family History: The patient's family history includes Arthritis in his mother;  Crohn's disease in his sister; Heart Problems in his maternal grandmother; Heart attack in his maternal grandfather; Hypertension in his mother; Leukemia in his brother; Skin cancer in his sister; Stroke in his father.  ROS:   Please see the history of present illness.    All other systems reviewed and are negative.  EKGs/Labs/Other Studies Reviewed:    The following studies were reviewed today: Echo 10-25-2021: 1. Left ventricular ejection fraction, by estimation, is 55 to 60%. The  left ventricle has normal function. The left ventricle has no regional  wall motion abnormalities. There is moderate concentric left ventricular  hypertrophy. Left ventricular  diastolic parameters are consistent with Grade II diastolic dysfunction  (pseudonormalization). Elevated left atrial pressure.   2. Right ventricular systolic function is normal. The right ventricular  size is normal.  There is normal pulmonary artery systolic pressure.   3. Left atrial size was severely dilated.   4. 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 Severe mitral valve regurgitation. There is severe  holosystolic prolapse of both leaflets of the mitral valve.   5. The aortic valve is tricuspid. Aortic valve regurgitation is not  visualized. Aortic valve sclerosis is present, with no evidence of aortic  valve stenosis.   6. Aortic dilatation noted. There is mild dilatation of the aortic root,  measuring 41 mm. There is mild dilatation of the ascending aorta,  measuring 40 mm.   7. The inferior vena cava is normal in size with greater than 50%  respiratory variability, suggesting right atrial pressure of 3 mmHg.   Comparison(s): Prior images reviewed side by side. The left ventricular  function is worsened. 06/23/20 EF 60-65%. Severe MR.   Recent Labs: 08/14/2021: ALT 29; BUN 16; Creatinine, Ser 0.72; Hemoglobin 15.3; Platelets 181; Potassium 3.7; Sodium 128  Recent Lipid Panel     Component Value Date/Time   CHOL 145 05/20/2019 1017   TRIG 96 05/20/2019 1017   HDL 46 05/20/2019 1017   CHOLHDL 3.2 05/20/2019 1017   LDLCALC 80 05/20/2019 1017     Risk Assessment/Calculations:           Physical Exam:    VS:  BP 120/82   Pulse 66   Ht 5\' 8"  (1.727 m)   Wt 160 lb 3.2 oz (72.7 kg)   SpO2 99%   BMI 24.36 kg/m     Wt Readings from Last 3 Encounters:  10/25/21 160 lb 3.2 oz (72.7 kg)  04/25/21 158 lb 6.4 oz (71.8 kg)  04/12/21 158 lb (71.7 kg)     GEN:  Well nourished, well developed in no acute distress, kyphosis noted HEENT: Normal NECK: No JVD; No carotid bruits LYMPHATICS: No lymphadenopathy CARDIAC: RRR, 3/6 mid/late systolic murmur at the apex RESPIRATORY:  Clear to auscultation without rales, wheezing or rhonchi  ABDOMEN: Soft, non-tender, non-distended MUSCULOSKELETAL:  No edema; No deformity  SKIN: Warm and dry NEUROLOGIC:  Alert and oriented x 3 PSYCHIATRIC:  Normal affect   ASSESSMENT:    1. Severe mitral regurgitation    PLAN:    In order of problems listed above:  The patient has New York Heart Association functional class I symptoms, likely with some limitation related to his arthritis and kyphoscoliosis.  He does not have any cardiopulmonary symptoms.  Today's echo study is reviewed and shows low normal LV function with an LVEF of 55 to 60% (probably reduced LV function considering the degree of mitral regurgitation).  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  The left atrium is severely enlarged.  There is no evidence of pulmonary hypertension.  The patient has not been keen on moving forward with surgical consultation.  We discussed treatment considerations today, including ongoing echo surveillance and clinical follow-up, transcatheter therapies, and surgical referral for mitral valve repair or replacement.  We had previously discussed his case with our mitral valve surgeon who was  concerned about his anatomy and thought that he would not be a simple valve repair because of annular calcification.  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  He understands that we do not currently have a specialized mitral valve surgeon in our health system.  I am going to review his case with our structural heart valve team of  specialists.  Considerations include outside surgical referral versus ongoing medical therapy and clinical surveillance.  I do not think his valve is well suited for transcatheter edge-to-edge mitral valve repair.     Medication Adjustments/Labs and Tests Ordered: Current medicines are reviewed at length with the patient today.  Concerns regarding medicines are outlined above.  Orders Placed This Encounter  Procedures   ECHOCARDIOGRAM COMPLETE   No orders of the defined types were placed in this encounter.   Patient Instructions  Medication Instructions:  Your physician recommends that you continue on your current medications as directed. Please refer to the Current Medication list given to you today.  *If you need a refill on your cardiac medications before your next appointment, please call your pharmacy*  Lab Work: If you have labs (blood work) drawn today and your tests are completely normal, you will receive your results only by: [place] (if you have MyChart) OR A paper copy in the mail If you have any lab test that is abnormal or we need to change your treatment, we will call you to review the results.  Testing/Procedures: Your physician has requested that you have an echocardiogram in 1 year same day as office visit. Echocardiography is a painless test that uses sound waves to create images of your heart. It provides your doctor with information about the size and shape of your heart and how well your heart's chambers and valves are working. This procedure takes approximately one hour. There are no restrictions for this  procedure.  Follow-Up: At [facility], you and your health needs are our priority.  As part of our continuing mission to provide you with exceptional heart care, we have created designated Provider Care Teams.  These Care Teams include your primary Cardiologist (physician) and Advanced Practice Providers (APPs -  Physician Assistants and Nurse Practitioners) who all work together to provide you with the care you need, when you need it.  We recommend signing up for the patient portal called "MyChart".  Sign up information is provided on this After Visit Summary.  MyChart is used to connect with patients for Virtual Visits (Telemedicine).  Patients are able to view lab/test results, encounter notes, upcoming appointments, etc.  Non-urgent messages can be sent to your provider as well.   To learn more about what you can do with MyChart, go to [email or web address].    Your next appointment:   1 year(s)  The format for your next appointment:   In Person  Provider:   Sherren Mocha, MD       Signed, Sherren Mocha, MD  10/25/2021 6:05 PM    [place]

## 2021-10-25 NOTE — Patient Instructions (Signed)
Medication Instructions:  Your physician recommends that you continue on your current medications as directed. Please refer to the Current Medication list given to you today.  *If you need a refill on your cardiac medications before your next appointment, please call your pharmacy*  Lab Work: If you have labs (blood work) drawn today and your tests are completely normal, you will receive your results only by: [place] (if you have MyChart) OR A paper copy in the mail If you have any lab test that is abnormal or we need to change your treatment, we will call you to review the results.  Testing/Procedures: Your physician has requested that you have an echocardiogram in 1 year same day as office visit. Echocardiography is a painless test that uses sound waves to create images of your heart. It provides your doctor with information about the size and shape of your heart and how well your heart's chambers and valves are working. This procedure takes approximately one hour. There are no restrictions for this procedure.  Follow-Up: At [facility], you and your health needs are our priority.  As part of our continuing mission to provide you with exceptional heart care, we have created designated Provider Care Teams.  These Care Teams include your primary Cardiologist (physician) and Advanced Practice Providers (APPs -  Physician Assistants and Nurse Practitioners) who all work together to provide you with the care you need, when you need it.  We recommend signing up for the patient portal called "MyChart".  Sign up information is provided on this After Visit Summary.  MyChart is used to connect with patients for Virtual Visits (Telemedicine).  Patients are able to view lab/test results, encounter notes, upcoming appointments, etc.  Non-urgent messages can be sent to your provider as well.   To learn more about what you can do with MyChart, go to [email or web address].    Your next  appointment:   1 year(s)  The format for your next appointment:   In Person  Provider:   Sherren Mocha, MD

## 2021-11-06 ENCOUNTER — Other Ambulatory Visit (HOSPITAL_BASED_OUTPATIENT_CLINIC_OR_DEPARTMENT_OTHER): Payer: Self-pay

## 2021-11-06 DIAGNOSIS — Z23 Encounter for immunization: Secondary | ICD-10-CM | POA: Diagnosis not present

## 2021-11-06 MED ORDER — PFIZER COVID-19 VAC BIVALENT 30 MCG/0.3ML IM SUSP
INTRAMUSCULAR | 0 refills | Status: DC
  Filled 2021-11-06: qty 0.3, 1d supply, fill #0

## 2021-11-13 DIAGNOSIS — L570 Actinic keratosis: Secondary | ICD-10-CM | POA: Diagnosis not present

## 2021-11-13 DIAGNOSIS — L300 Nummular dermatitis: Secondary | ICD-10-CM | POA: Diagnosis not present

## 2021-11-13 DIAGNOSIS — Z111 Encounter for screening for respiratory tuberculosis: Secondary | ICD-10-CM | POA: Diagnosis not present

## 2021-11-13 DIAGNOSIS — L4050 Arthropathic psoriasis, unspecified: Secondary | ICD-10-CM | POA: Diagnosis not present

## 2021-11-13 DIAGNOSIS — L821 Other seborrheic keratosis: Secondary | ICD-10-CM | POA: Diagnosis not present

## 2021-11-13 DIAGNOSIS — L400 Psoriasis vulgaris: Secondary | ICD-10-CM | POA: Diagnosis not present

## 2021-11-16 DIAGNOSIS — M79645 Pain in left finger(s): Secondary | ICD-10-CM | POA: Diagnosis not present

## 2021-11-16 DIAGNOSIS — M79644 Pain in right finger(s): Secondary | ICD-10-CM | POA: Diagnosis not present

## 2021-12-18 DIAGNOSIS — H43393 Other vitreous opacities, bilateral: Secondary | ICD-10-CM | POA: Diagnosis not present

## 2021-12-18 DIAGNOSIS — H524 Presbyopia: Secondary | ICD-10-CM | POA: Diagnosis not present

## 2021-12-18 DIAGNOSIS — Z961 Presence of intraocular lens: Secondary | ICD-10-CM | POA: Diagnosis not present

## 2021-12-18 DIAGNOSIS — H3562 Retinal hemorrhage, left eye: Secondary | ICD-10-CM | POA: Diagnosis not present

## 2021-12-28 DIAGNOSIS — E782 Mixed hyperlipidemia: Secondary | ICD-10-CM | POA: Diagnosis not present

## 2021-12-28 DIAGNOSIS — E039 Hypothyroidism, unspecified: Secondary | ICD-10-CM | POA: Diagnosis not present

## 2021-12-28 DIAGNOSIS — M05749 Rheumatoid arthritis with rheumatoid factor of unspecified hand without organ or systems involvement: Secondary | ICD-10-CM | POA: Diagnosis not present

## 2021-12-28 DIAGNOSIS — L4050 Arthropathic psoriasis, unspecified: Secondary | ICD-10-CM | POA: Diagnosis not present

## 2021-12-28 DIAGNOSIS — I10 Essential (primary) hypertension: Secondary | ICD-10-CM | POA: Diagnosis not present

## 2021-12-28 DIAGNOSIS — Z125 Encounter for screening for malignant neoplasm of prostate: Secondary | ICD-10-CM | POA: Diagnosis not present

## 2021-12-28 DIAGNOSIS — Z6825 Body mass index (BMI) 25.0-25.9, adult: Secondary | ICD-10-CM | POA: Diagnosis not present

## 2021-12-28 DIAGNOSIS — K219 Gastro-esophageal reflux disease without esophagitis: Secondary | ICD-10-CM | POA: Diagnosis not present

## 2021-12-28 DIAGNOSIS — I341 Nonrheumatic mitral (valve) prolapse: Secondary | ICD-10-CM | POA: Diagnosis not present

## 2021-12-28 DIAGNOSIS — R972 Elevated prostate specific antigen [PSA]: Secondary | ICD-10-CM | POA: Diagnosis not present

## 2022-03-26 IMAGING — RF DG ESOPHAGUS
12 of 13 series · 20 of 24 positions shown · non-contrast
Comparison: None

CLINICAL DATA: Mitral regurgitation with inability to passed a
probe for transesophageal echocardiography, history of thyroid
disease and reported radiation to the neck.

EXAM:
ESOPHOGRAM/BARIUM SWALLOW
TECHNIQUE: Single contrast examination was performed using  thin barium.
FLUOROSCOPY TIME:  Fluoroscopy Time:  1 minutes 36 seconds
Radiation Exposure Index (if provided by the fluoroscopic device):
14.6 mGy
Number of Acquired Spot Images: 2

[Series 1: cp_standard · 0.34mm/px · 2 of 32 frames shown (1 of 11)]
[frame 5/32]
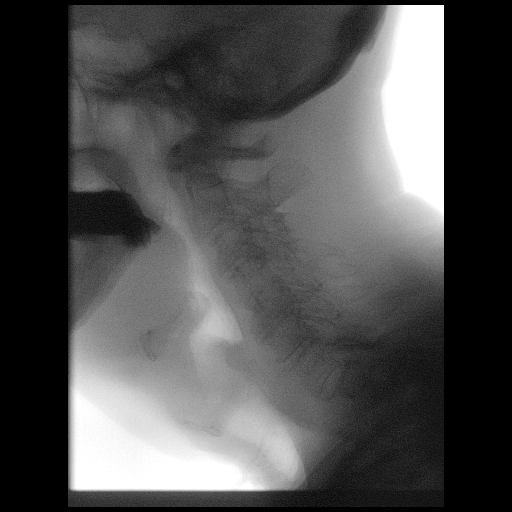
[frame 17/32]
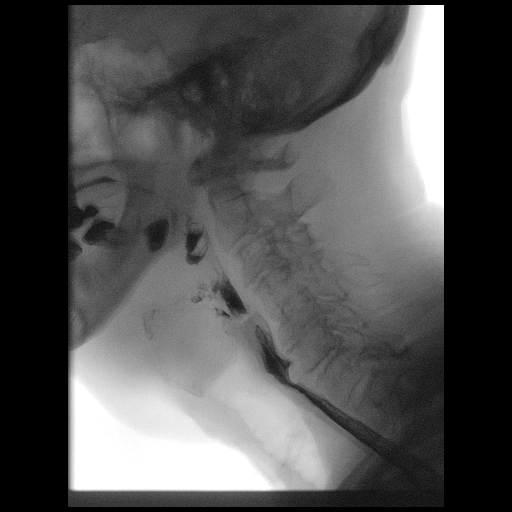

[Series 2: cp_standard · 0.34mm/px · 1 of 37 frames shown (2 of 11)]
[frame 19/37]
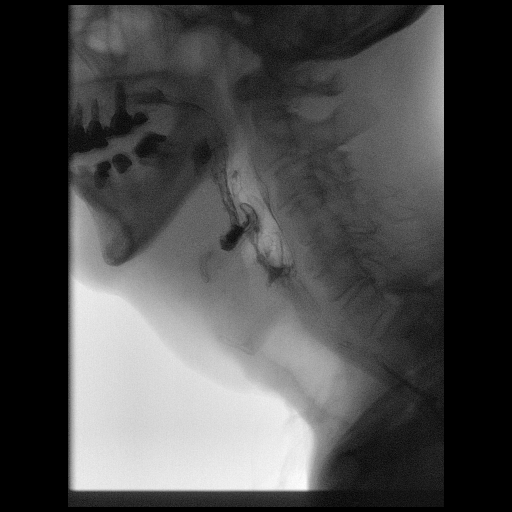

[Series 3: cp_standard · 0.34mm/px · 2 of 35 frames shown (3 of 11)]
[frame 6/35]
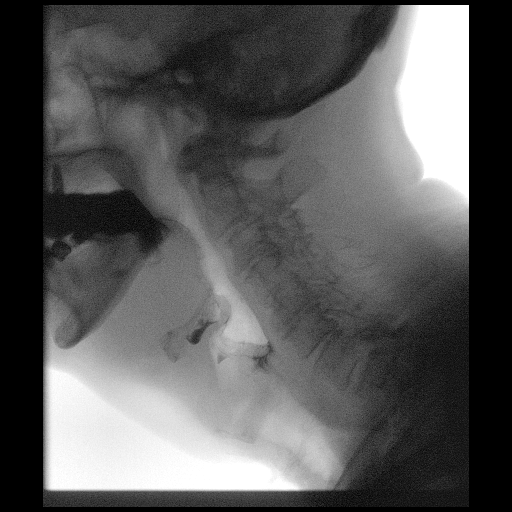
[frame 30/35]
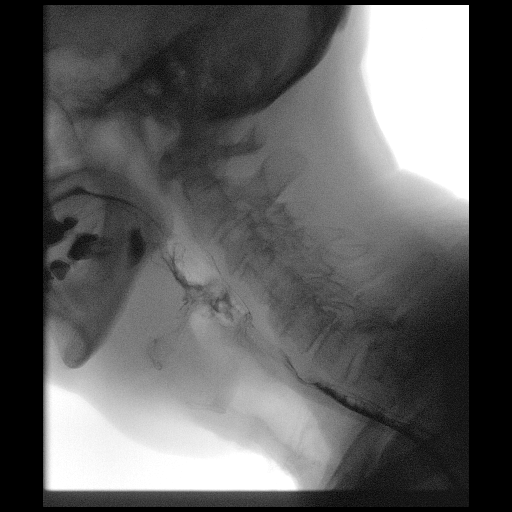

[Series 4: cp_standard · 0.34mm/px · 2 of 52 frames shown (4 of 11)]
[frame 23/52]
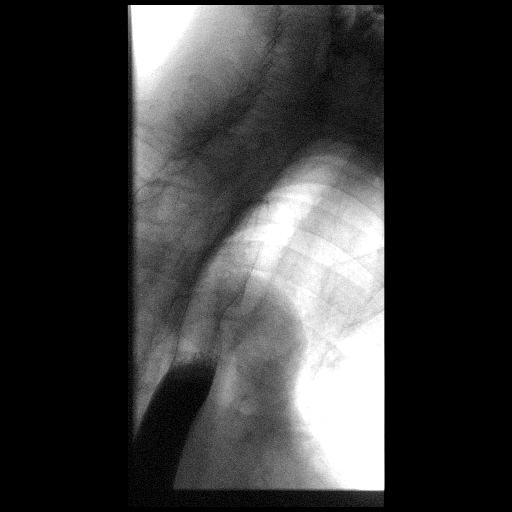
[frame 45/52]
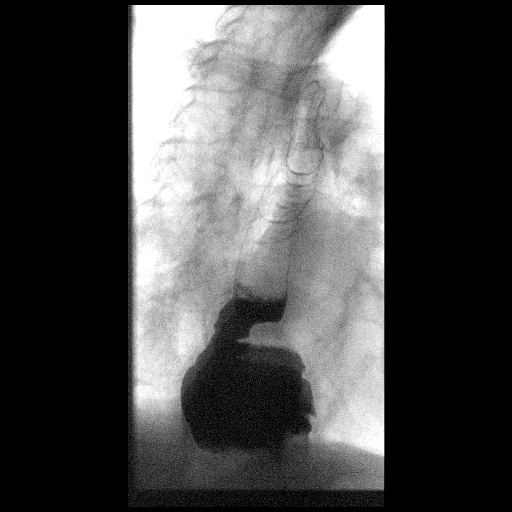

[Series 6: cp_standard · 0.34mm/px · 1 of 47 frames shown (5 of 11)]
[frame 39/47]
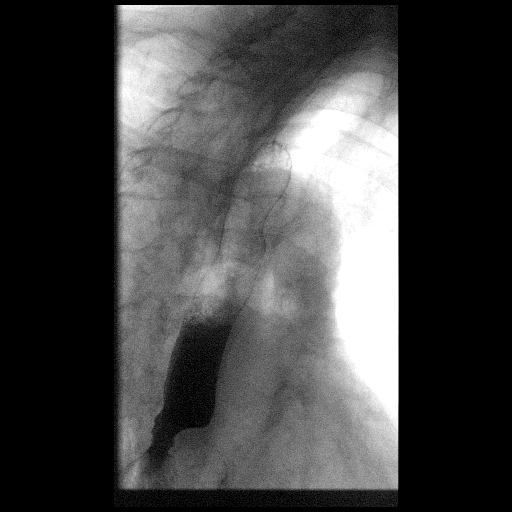

[Series 7: cp_standard · 0.34mm/px · 2 of 29 frames shown (6 of 11)]
[frame 3/29]
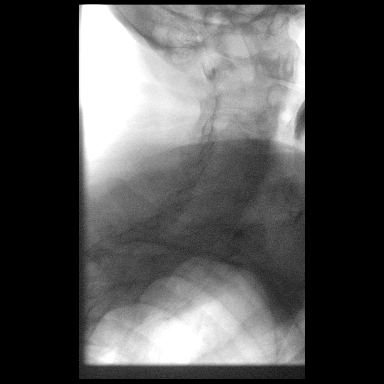
[frame 15/29]
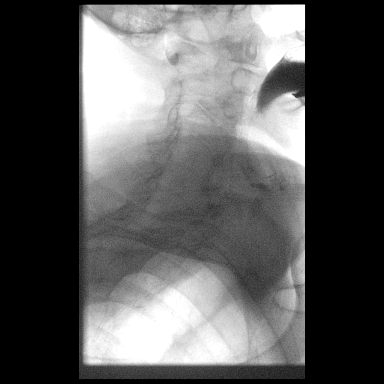

[Series 8: cp_standard · 0.34mm/px · 2 of 41 frames shown (7 of 11)]
[frame 21/41]
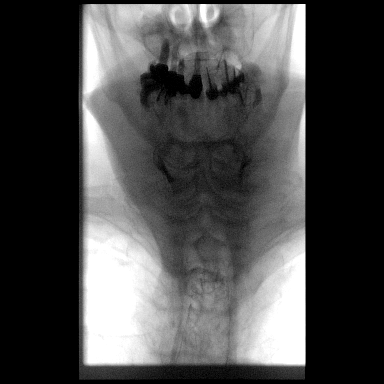
[frame 35/41]
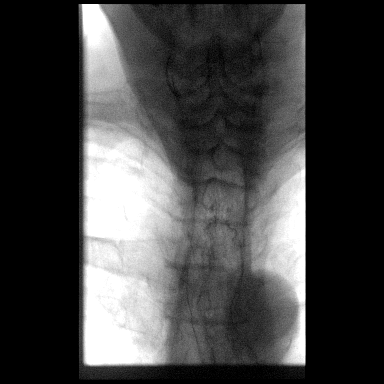

[Series 9: cp_standard · 0.34mm/px · 1 of 65 frames shown (8 of 11)]
[frame 56/65]
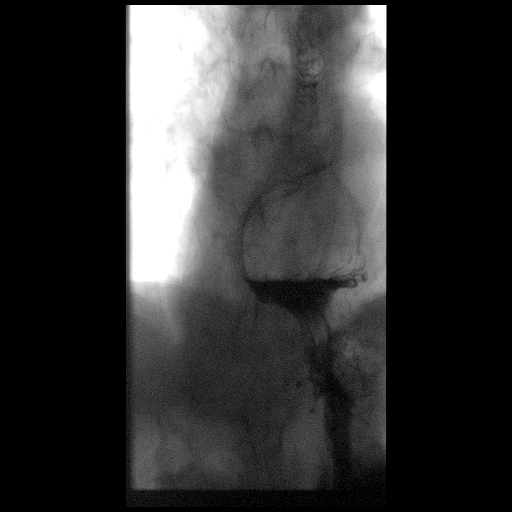

[Series 10: cp_standard · 0.34mm/px · 2 of 55 frames shown (9 of 11)]
[frame 28/55]
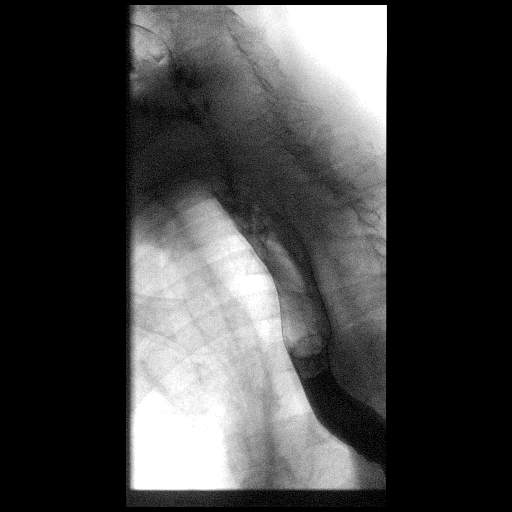
[frame 47/55]
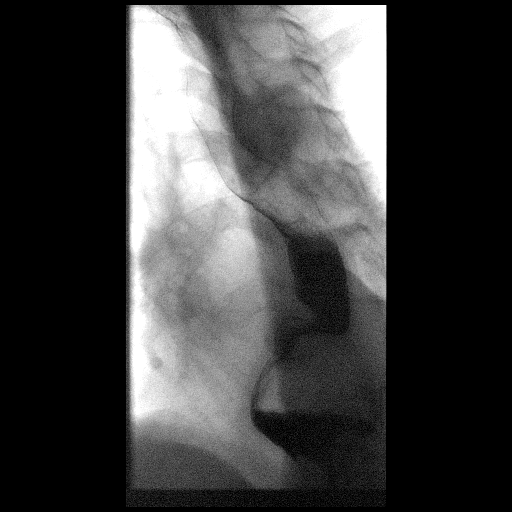

[Series 11: cp_standard · 0.34mm/px · 2 of 26 frames shown (10 of 11)]
[frame 9/26]
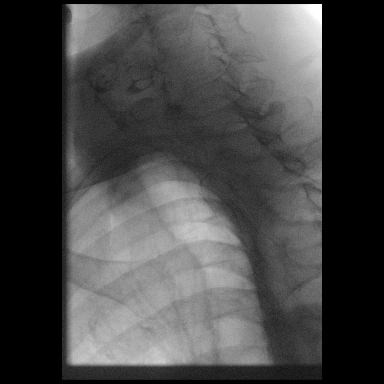
[frame 23/26]
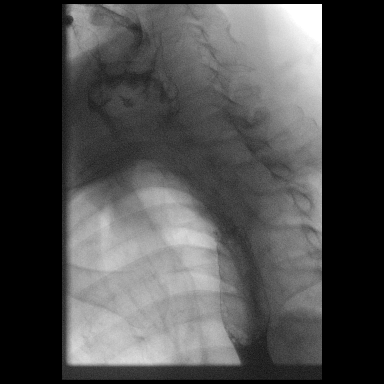

[Series 13: cp_standard · 0.36mm/px · 2 of 50 frames shown (11 of 11)]
[frame 8/50]
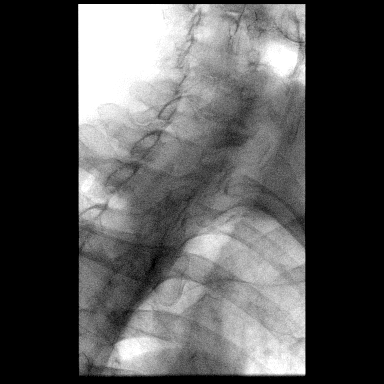
[frame 43/50]
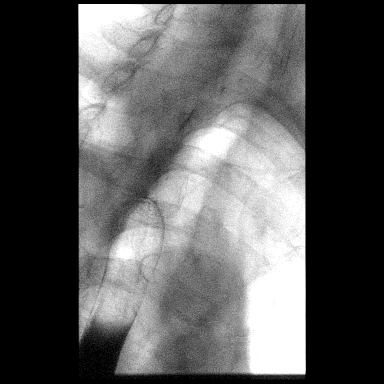

[Series 14: fluoro_barium 2fps_bw · 0.18mm/px · 1 of 1 slices shown]
[im 1/1]
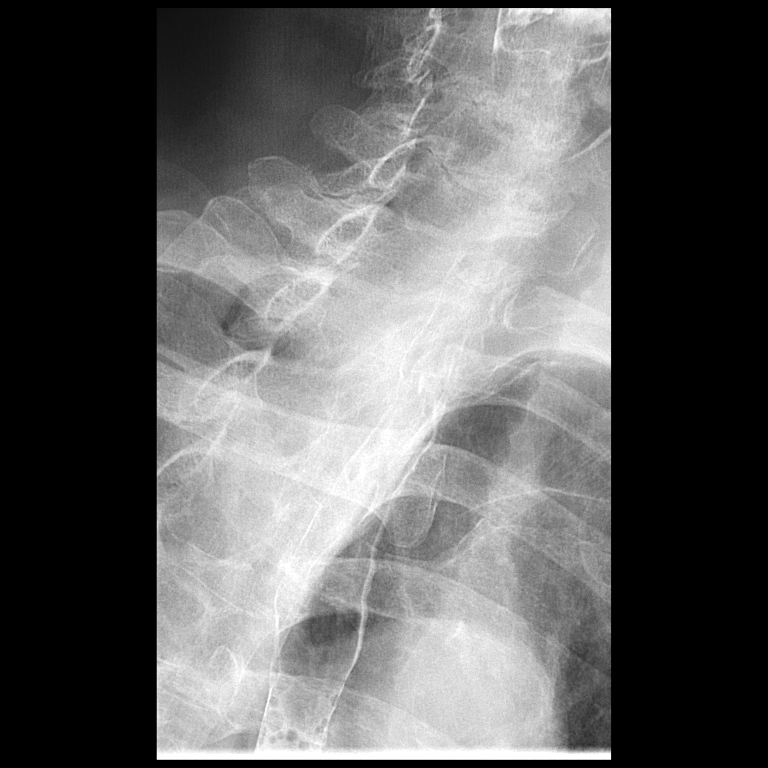

[20 of 24 positions shown; findings below may reference images not displayed]

FINDINGS: Thin barium was administered in multiple projections. The exam was
limited due to difficulty in patient positioning secondary to marked
thoracic kyphosis.

Lateral projection single swallow shows CP bar. This is moderate
cricopharyngeal impression.

Oblique and AP projections with esophageal narrowing at the thoracic
inlet best seen on image 13 of the fourth series. Ballooning of the
hypopharynx and oropharynx during swallowing. This appears more
pronounced on oblique than on AP projection.

The remainder of the esophagus shows normal distensibility though
with limited assessment due to lack of full column evaluation in
prone position due to patient discomfort. A mixed type hiatal hernia
is noted, moderately large with approximately 30-40% of the stomach
herniated into the chest. On oblique projections the esophageal
narrowing is in the range of 5 mm. Given proximity to airway a
barium tablet was not administered and given known narrowing at this
level this was not performed.

Degenerative changes are noted throughout the cervical and
visualized thoracic spine with kyphosis as described, not well
evaluated.
IMPRESSION: Esophageal narrowing at the thoracic inlet as described. Endoscopic
assessment may be helpful to further evaluate this area and exclude
underlying lesion and assess for degree of narrowing.

Moderate cricopharyngeal impression, this is also seen in concert
with the narrowing noted at endoscopy and on the fluoroscopic
assessment and may contribute to narrowing on the current study.

Sliding and paraesophageal, mixed type hiatal hernia with limited
assessment due to patient positioning. Distal esophagus not as well
assessed given lack of full column evaluation in prone RAO position.
Grossly normal distensibility of the remainder of the esophagus is
however noted on the current study.

## 2022-04-02 DIAGNOSIS — L4050 Arthropathic psoriasis, unspecified: Secondary | ICD-10-CM | POA: Diagnosis not present

## 2022-04-02 DIAGNOSIS — L404 Guttate psoriasis: Secondary | ICD-10-CM | POA: Diagnosis not present

## 2022-04-24 ENCOUNTER — Ambulatory Visit (HOSPITAL_COMMUNITY): Payer: Medicare Other | Attending: Internal Medicine

## 2022-04-24 DIAGNOSIS — I340 Nonrheumatic mitral (valve) insufficiency: Secondary | ICD-10-CM | POA: Insufficient documentation

## 2022-04-24 LAB — ECHOCARDIOGRAM COMPLETE
Area-P 1/2: 4.06 cm2
MV M vel: 5.55 m/s
MV Peak grad: 123.2 mmHg
Radius: 0.7 cm
S' Lateral: 3.3 cm

## 2022-05-14 ENCOUNTER — Ambulatory Visit (INDEPENDENT_AMBULATORY_CARE_PROVIDER_SITE_OTHER): Payer: Medicare Other | Admitting: Cardiovascular Disease

## 2022-05-14 ENCOUNTER — Encounter: Payer: Self-pay | Admitting: Cardiovascular Disease

## 2022-05-14 VITALS — BP 130/82 | HR 84 | Ht 68.0 in | Wt 163.4 lb

## 2022-05-14 DIAGNOSIS — I10 Essential (primary) hypertension: Secondary | ICD-10-CM

## 2022-05-14 DIAGNOSIS — E782 Mixed hyperlipidemia: Secondary | ICD-10-CM

## 2022-05-14 DIAGNOSIS — I340 Nonrheumatic mitral (valve) insufficiency: Secondary | ICD-10-CM | POA: Diagnosis not present

## 2022-05-14 NOTE — Patient Instructions (Addendum)
Medication Instructions:  Your physician recommends that you continue on your current medications as directed. Please refer to the Current Medication list given to you today.  *If you need a refill on your cardiac medications before your next appointment, please call your pharmacy*   Lab Work: NONE If you have labs (blood work) drawn today and your tests are completely normal, you will receive your results only by: [place] (if you have MyChart) OR A paper copy in the mail If you have any lab test that is abnormal or we need to change your treatment, we will call you to review the results.   Testing/Procedures: ECHO (prior to next appt) Your physician has requested that you have an echocardiogram. Echocardiography is a painless test that uses sound waves to create images of your heart. It provides your doctor with information about the size and shape of your heart and how well your heart's chambers and valves are working. This procedure takes approximately one hour. There are no restrictions for this procedure.  Follow-Up: At [facility], you and your health needs are our priority.  As part of our continuing mission to provide you with exceptional heart care, we have created designated Provider Care Teams.  These Care Teams include your primary Cardiologist (physician) and Advanced Practice Providers (APPs -  Physician Assistants and Nurse Practitioners) who all work together to provide you with the care you need, when you need it.  Your next appointment:   6 month(s)  The format for your next appointment:   In Person  Provider:   Sherren Mocha, MD       Important Information About [place]

## 2022-05-14 NOTE — Progress Notes (Unsigned)
Cardiology Office Note:    Date:  05/14/2022   ID:  Cristian Nunez, DOB Aug 02, 1946, MRN [id number]  PCP:  Renaldo Reel, [facility] Providers Cardiologist:  Sherren Mocha, MD     Referring MD: Renaldo Reel, [place]   Chief Complaint  Patient presents with   Mitral Regurgitation    History of Present Illness:    Cristian Nunez is a 76 y.o. male with a hx of mitral valve prolapse and severe mitral regurgitation presenting for follow-up evaluation.  The patient has been followed since July 2021 when he was first seen for evaluation of severe mitral regurgitation.  He is completely asymptomatic and has been followed with clinical surveillance.  The patient underwent a stress echocardiogram demonstrating fair exercise tolerance at 6 minutes and 30 seconds according to the Bruce protocol his mitral regurgitation was felt to be severe at baseline and worsened with exercise.  The patient's BNP level was 480 which is in the upper limit of normal.  We discussed surgical referral but the patient preferred close follow-up.   He is here with his wife today. He is walking 30 minutes, 4-5 days weekly. He denies any symptoms with that level of activity. He denies orthopnea, PND, or edema. No lightheadedness or syncope.  Overall the patient is doing well with no cardiac-related complaints.  No exertional dyspnea, chest pain, or chest pressure.  Past Medical History:  Diagnosis Date   Abnormal EKG 12/09/2018   Arthritis    psoriatic   Esophageal stricture    Essential hypertension 12/09/2018   GERD (gastroesophageal reflux disease)    History of thyroid cancer    thyroid   Hyperlipidemia    Hypertension    Hypothyroidism    90% thyroidectomy 1973   Mitral regurgitation 11/18/2019   Mitral valve prolapse    Mixed dyslipidemia 12/09/2018   Psoriatic arthritis ([place])    Raynaud's syndrome    Thyroid cancer [facility])     Past Surgical History:  Procedure Laterality Date   BALLOON DILATION N/A  04/12/2021   Procedure: BALLOON DILATION;  Surgeon: Gatha Mayer, MD;  Location: [facility] ENDOSCOPY;  Service: Endoscopy;  Laterality: N/A;   COLONOSCOPY     ESOPHAGOGASTRODUODENOSCOPY (EGD) WITH PROPOFOL N/A 04/12/2021   Procedure: ESOPHAGOGASTRODUODENOSCOPY (EGD) WITH PROPOFOL;  Surgeon: Gatha Mayer, MD;  Location: [place];  Service: Endoscopy;  Laterality: N/A;  needs fluoro   INGUINAL HERNIA REPAIR Left 01/28/2019   Procedure: LEFT INGUINAL HERNIA REPAIR WITH MESH;  Surgeon: Rolm Bookbinder, MD;  Location: [place];  Service: General;  Laterality: Left;  GENERAL ANESTHESIA AND TAP BLOCK   KNEE ARTHROSCOPY Left    RIGHT/LEFT HEART CATH AND CORONARY ANGIOGRAPHY N/A 02/03/2021   Procedure: RIGHT/LEFT HEART CATH AND CORONARY ANGIOGRAPHY;  Surgeon: Sherren Mocha, MD;  Location: [place] CV LAB;  Service: Cardiovascular;  Laterality: N/A;   TEE WITHOUT CARDIOVERSION N/A 04/12/2021   Procedure: TRANSESOPHAGEAL ECHOCARDIOGRAM (TEE);  Surgeon: Werner Lean, MD;  Location: [facility] ENDOSCOPY;  Service: Cardiovascular;  Laterality: N/A;   THYROIDECTOMY, PARTIAL     UMBILICAL HERNIA REPAIR     VARICOSE VEIN SURGERY Right     Current Medications: Current Meds  Medication Sig   amLODipine (NORVASC) 5 MG tablet Take 5 mg by mouth daily.   amoxicillin (AMOXIL) 500 MG capsule amoxicillin 500 mg capsule  TAKE 1 CAPSULE BY MOUTH THREE TIMES DAILY WITH FOOD   aspirin-acetaminophen-caffeine (EXCEDRIN MIGRAINE) 250-250-65 MG tablet Take 1 tablet by  mouth every 6 (six) hours as needed for headache.   b complex vitamins tablet Take 1 tablet by mouth daily.   clindamycin (CLEOCIN T) 1 % lotion Apply 1 application topically daily as needed (irritation).   clobetasol ointment (TEMOVATE) 7.67 % Apply 1 application topically 2 (two) times daily as needed (irritation).   COVID-19 mRNA bivalent vaccine, Pfizer, (PFIZER COVID-19 VAC BIVALENT) injection Inject into the muscle.   COVID-19 mRNA Vac-TriS,  Pfizer, (PFIZER-BIONT COVID-19 VAC-TRIS) SUSP injection Inject into the muscle.   fexofenadine (ALLEGRA) 180 MG tablet Take 180 mg by mouth daily as needed for allergies. During the Spring   Ixekizumab (TALTZ) 80 MG/ML SOAJ Inject into the skin every 30 (thirty) days.   ketoconazole (NIZORAL) 2 % shampoo Apply 1 application topically every 30 (thirty) days.   levothyroxine (SYNTHROID) 50 MCG tablet Take 50 mcg by mouth every other day.   levothyroxine (SYNTHROID) 75 MCG tablet Take 75 mcg by mouth every other day.   methocarbamol (ROBAXIN) 500 MG tablet Take 500 mg by mouth every 12 (twelve) hours as needed for muscle pain. for pain   omeprazole (PRILOSEC) 20 MG capsule Take 20 mg by mouth daily.   pseudoephedrine (SUDAFED) 30 MG tablet Take 30 mg by mouth daily as needed for congestion.   rosuvastatin (CRESTOR) 10 MG tablet Take 10 mg by mouth daily.   triamcinolone cream (KENALOG) 0.1 % Apply 1 application. topically as needed.     Allergies:   Patient has no known allergies.   Social History   Socioeconomic History   Marital status: Married    Spouse name: Not on file   Number of children: Not on file   Years of education: Not on file   Highest education level: Not on file  Occupational History   Occupation: retired    Comment: Pharmacist  Tobacco Use   Smoking status: Never   Smokeless tobacco: Never  Vaping Use   Vaping Use: Never used  Substance and Sexual Activity   Alcohol use: Yes    Alcohol/week: 14.0 - 21.0 standard drinks    Types: 14 - 21 Cans of beer per week   Drug use: Never   Sexual activity: Not on file  Other Topics Concern   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 working for [employer] in [employer]   4 daughters 1 daughter Harland German is the Surveyor, quantity in charge of short stay at [facility]   Never smoker 2 beers a day no caffeine no drug use no other tobacco    Social Determinants of Radio broadcast assistant Strain: Not on file  Food Insecurity: Not on file  Transportation Needs: Not on file  Physical Activity: Not on file  Stress: Not on file  Social Connections: Not on file     Family History: The patient's family history includes Arthritis in his mother; Crohn's disease in his sister; Heart Problems in his maternal grandmother; Heart attack in his maternal grandfather; Hypertension in his mother; Leukemia in his brother; Skin cancer in his sister; Stroke in his father.  ROS:   Please see the history of present illness.    All other systems reviewed and are negative.  EKGs/Labs/Other Studies Reviewed:    The following studies were reviewed today: Echo 04/24/2022:  1. Left ventricular ejection fraction, by estimation, is 60 to 65%. Left  ventricular ejection fraction by PLAX  is 61 %. The left ventricle has  normal function. The left ventricle has no regional wall motion  abnormalities. There is mild left ventricular  hypertrophy. Left ventricular diastolic parameters are consistent with  Grade II diastolic dysfunction (pseudonormalization). Elevated left  ventricular end-diastolic pressure. The E/e' is 45.   2. Right ventricular systolic function is normal. The right ventricular  size is normal. There is normal pulmonary artery systolic pressure. The  estimated right ventricular systolic pressure is 59.5 mmHg.   3. Left atrial size was severely dilated.   4. 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  The mitral valve is myxomatous. Severe mitral valve regurgitation. There  is severe late systolic prolapse of both leaflets of the mitral valve.   5. The aortic valve is tricuspid. Aortic valve regurgitation is not  visualized. Aortic valve sclerosis is present, with no evidence of aortic  valve stenosis.   6. The inferior vena cava is normal in size with greater than 50%  respiratory variability,  suggesting right atrial pressure of 3 mmHg.   Comparison(s): Changes from prior study are noted. 10/25/2021: LVEF  55-60%, severe MR, dilated aorta to 41 mm.   Cardiac Cath 02/03/2021: 1.  Patent coronary arteries with mild diffuse atherosclerotic disease noted.  There is diffuse calcification of the RCA in particular.  There is mild diffuse calcification of the LAD.  There is no more than 30 to 40% stenosis noted in any of the coronary vessels. 2.  Known severe mitral regurgitation with normal right heart hemodynamics 3.  Calcified mitral annulus is noted on plain fluoroscopy.  EKG:  EKG is ordered today.  The ekg ordered today demonstrates NSR 84 bpm, leftward axis, otherwise normal  Recent Labs: 08/14/2021: ALT 29; BUN 16; Creatinine, Ser 0.72; Hemoglobin 15.3; Platelets 181; Potassium 3.7; Sodium 128  Recent Lipid Panel    Component Value Date/Time   CHOL 145 05/20/2019 1017   TRIG 96 05/20/2019 1017   HDL 46 05/20/2019 1017   CHOLHDL 3.2 05/20/2019 1017   LDLCALC 80 05/20/2019 1017     Risk Assessment/Calculations:           Physical Exam:    VS:  BP 130/82   Pulse 84   Ht '5\' 8"'$  (1.727 m)   Wt 163 lb 6.4 oz (74.1 kg)   SpO2 99%   BMI 24.84 kg/m     Wt Readings from Last 3 Encounters:  05/14/22 163 lb 6.4 oz (74.1 kg)  10/25/21 160 lb 3.2 oz (72.7 kg)  04/25/21 158 lb 6.4 oz (71.8 kg)     GEN:  Well nourished, well developed in no acute distress HEENT: Normal NECK: No JVD; No carotid bruits LYMPHATICS: No lymphadenopathy CARDIAC: RRR, 3/6 mid-to-late systolic murmur over the apex RESPIRATORY:  Clear to auscultation without rales, wheezing or rhonchi  ABDOMEN: Soft, non-tender, non-distended MUSCULOSKELETAL:  No edema; No deformity  SKIN: Warm and dry NEUROLOGIC:  Alert and oriented x 3 PSYCHIATRIC:  Normal affect   ASSESSMENT:    1. Severe mitral regurgitation   2. Essential hypertension   3. Mixed dyslipidemia    PLAN:    In order of problems listed  above:  Lengthy discussion again today about the natural history of severe primary mitral regurgitation.  Fortunately, his echo study demonstrates no evidence of pulmonary hypertension.  This again demonstrates severe mitral valve prolapse with mitral regurgitation.  We reviewed potential treatment options, including ongoing clinical surveillance and medical therapy, surgical mitral valve  repair/replacement, and transcatheter mitral valve repair.  I would not consider the patient a good candidate for transcatheter edge-to-edge repair of the mitral valve because of his relative lack of symptoms, heavy mitral annular calcification, Barlow valve type, and chronic psoriatic arthritis with immunosuppression.  Some of these issues could also be problematic for cardiac surgery, but I think it would be reasonable to refer him for surgical consultation.  The patient prefers to follow-up in 6 months and when we have a mitral valve surgeon available in the [facility] health system, I will likely refer him after his 53-monthfollow-up.  He understands to contact me for any progressive symptoms or development of exercise intolerance. Blood pressure well controlled on current medical therapy reviewed as above with amlodipine Treated with rosuvastatin.  Followed by his primary care physician.     Medication Adjustments/Labs and Tests Ordered: Current medicines are reviewed at length with the patient today.  Concerns regarding medicines are outlined above.  Orders Placed This Encounter  Procedures   EKG 12-Lead   ECHOCARDIOGRAM COMPLETE   No orders of the defined types were placed in this encounter.   Patient Instructions  Medication Instructions:  Your physician recommends that you continue on your current medications as directed. Please refer to the Current Medication list given to you today.  *If you need a refill on your cardiac medications before your next appointment, please call your pharmacy*   Lab  Work: NONE If you have labs (blood work) drawn today and your tests are completely normal, you will receive your results only by: [place](if you have MyChart) OR A paper copy in the mail If you have any lab test that is abnormal or we need to change your treatment, we will call you to review the results.   Testing/Procedures: ECHO (prior to next appt) Your physician has requested that you have an echocardiogram. Echocardiography is a painless test that uses sound waves to create images of your heart. It provides your doctor with information about the size and shape of your heart and how well your heart's chambers and valves are working. This procedure takes approximately one hour. There are no restrictions for this procedure.  Follow-Up: [facility] and your health needs are our priority.  As part of our continuing mission to provide you with exceptional heart care, we have created designated Provider Care Teams.  These Care Teams include your primary Cardiologist (physician) and Advanced Practice Providers (APPs -  Physician Assistants and Nurse Practitioners) who all work together to provide you with the care you need, when you need it.  Your next appointment:   6 month(s)  The format for your next appointment:   In Person  Provider:   MSherren Mocha MD       Important Information About Sugar         Signed, MSherren Mocha MD  05/14/2022 11:29 AM    [place]

## 2022-06-13 DIAGNOSIS — Z961 Presence of intraocular lens: Secondary | ICD-10-CM | POA: Diagnosis not present

## 2022-06-13 DIAGNOSIS — H524 Presbyopia: Secondary | ICD-10-CM | POA: Diagnosis not present

## 2022-06-13 DIAGNOSIS — H43393 Other vitreous opacities, bilateral: Secondary | ICD-10-CM | POA: Diagnosis not present

## 2022-06-13 DIAGNOSIS — H00025 Hordeolum internum left lower eyelid: Secondary | ICD-10-CM | POA: Diagnosis not present

## 2022-06-13 DIAGNOSIS — H3562 Retinal hemorrhage, left eye: Secondary | ICD-10-CM | POA: Diagnosis not present

## 2022-06-25 DIAGNOSIS — L4050 Arthropathic psoriasis, unspecified: Secondary | ICD-10-CM | POA: Diagnosis not present

## 2022-06-25 DIAGNOSIS — L400 Psoriasis vulgaris: Secondary | ICD-10-CM | POA: Diagnosis not present

## 2022-07-02 DIAGNOSIS — E785 Hyperlipidemia, unspecified: Secondary | ICD-10-CM | POA: Diagnosis not present

## 2022-07-02 DIAGNOSIS — I10 Essential (primary) hypertension: Secondary | ICD-10-CM | POA: Diagnosis not present

## 2022-07-02 DIAGNOSIS — I340 Nonrheumatic mitral (valve) insufficiency: Secondary | ICD-10-CM | POA: Diagnosis not present

## 2022-07-02 DIAGNOSIS — L4050 Arthropathic psoriasis, unspecified: Secondary | ICD-10-CM | POA: Diagnosis not present

## 2022-07-02 DIAGNOSIS — E039 Hypothyroidism, unspecified: Secondary | ICD-10-CM | POA: Diagnosis not present

## 2022-07-02 DIAGNOSIS — K219 Gastro-esophageal reflux disease without esophagitis: Secondary | ICD-10-CM | POA: Diagnosis not present

## 2022-07-02 DIAGNOSIS — R972 Elevated prostate specific antigen [PSA]: Secondary | ICD-10-CM | POA: Diagnosis not present

## 2022-07-05 DIAGNOSIS — R7301 Impaired fasting glucose: Secondary | ICD-10-CM | POA: Diagnosis not present

## 2022-08-01 DIAGNOSIS — N50811 Right testicular pain: Secondary | ICD-10-CM | POA: Diagnosis not present

## 2022-08-01 DIAGNOSIS — R972 Elevated prostate specific antigen [PSA]: Secondary | ICD-10-CM | POA: Diagnosis not present

## 2022-08-10 DIAGNOSIS — Z0000 Encounter for general adult medical examination without abnormal findings: Secondary | ICD-10-CM | POA: Diagnosis not present

## 2022-08-10 DIAGNOSIS — Z9181 History of falling: Secondary | ICD-10-CM | POA: Diagnosis not present

## 2022-08-10 DIAGNOSIS — E785 Hyperlipidemia, unspecified: Secondary | ICD-10-CM | POA: Diagnosis not present

## 2022-08-10 DIAGNOSIS — Z1331 Encounter for screening for depression: Secondary | ICD-10-CM | POA: Diagnosis not present

## 2022-08-10 DIAGNOSIS — Z139 Encounter for screening, unspecified: Secondary | ICD-10-CM | POA: Diagnosis not present

## 2022-09-09 DIAGNOSIS — C61 Malignant neoplasm of prostate: Secondary | ICD-10-CM

## 2022-09-09 HISTORY — DX: Malignant neoplasm of prostate: C61

## 2022-09-12 DIAGNOSIS — N402 Nodular prostate without lower urinary tract symptoms: Secondary | ICD-10-CM | POA: Diagnosis not present

## 2022-09-12 DIAGNOSIS — C61 Malignant neoplasm of prostate: Secondary | ICD-10-CM | POA: Diagnosis not present

## 2022-09-12 DIAGNOSIS — R972 Elevated prostate specific antigen [PSA]: Secondary | ICD-10-CM | POA: Diagnosis not present

## 2022-09-17 DIAGNOSIS — L400 Psoriasis vulgaris: Secondary | ICD-10-CM | POA: Diagnosis not present

## 2022-09-18 ENCOUNTER — Other Ambulatory Visit (HOSPITAL_COMMUNITY): Payer: Self-pay | Admitting: Urology

## 2022-09-18 DIAGNOSIS — C61 Malignant neoplasm of prostate: Secondary | ICD-10-CM

## 2022-09-22 IMAGING — DX DG ABDOMEN 1V
1 series · 3 of 3 positions shown · non-contrast
Comparison: None.

CLINICAL DATA: Constipation, abdominal pain, recent finger surgery

EXAM:
ABDOMEN - 1 VIEW

[Series 1: abdomen · 0.14mm/px · 3 of 3 slices shown]
[im 1/3]
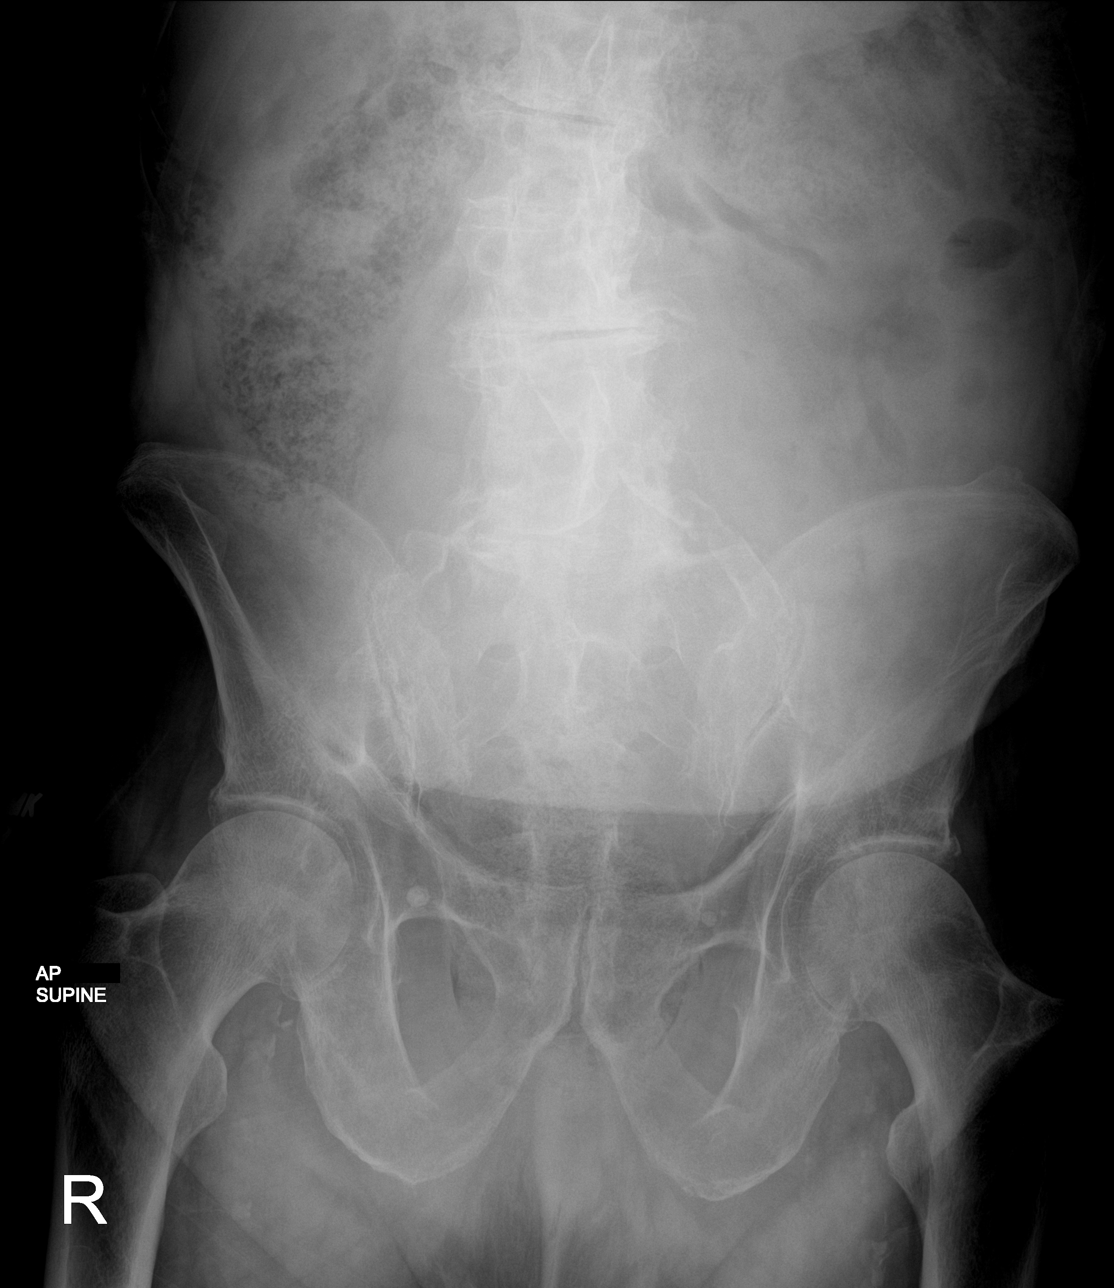
[im 2/3]
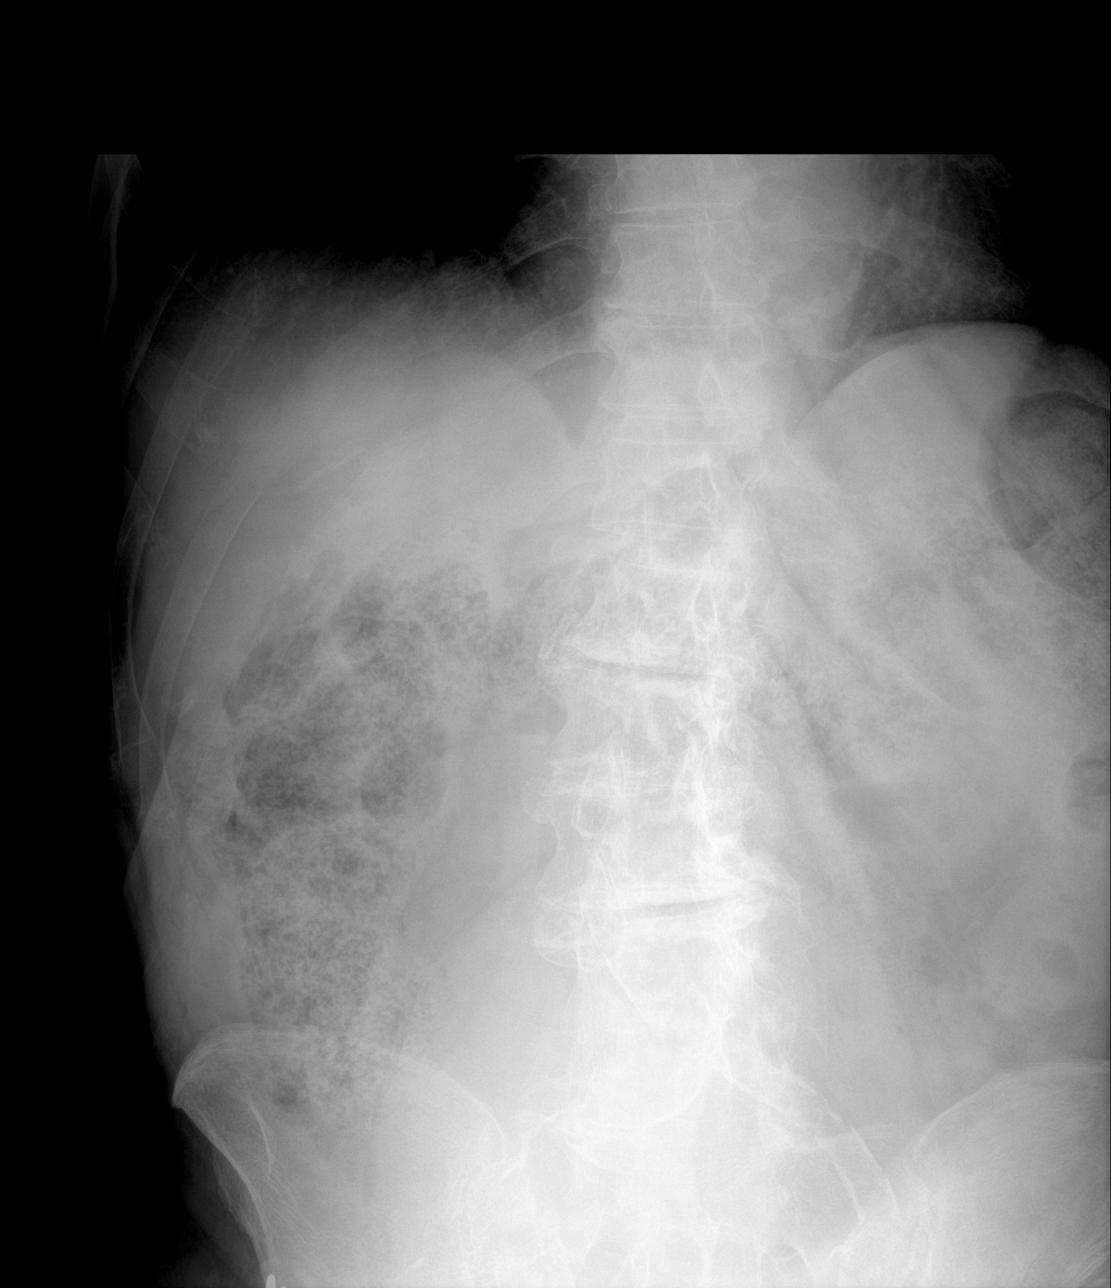
[im 3/3]
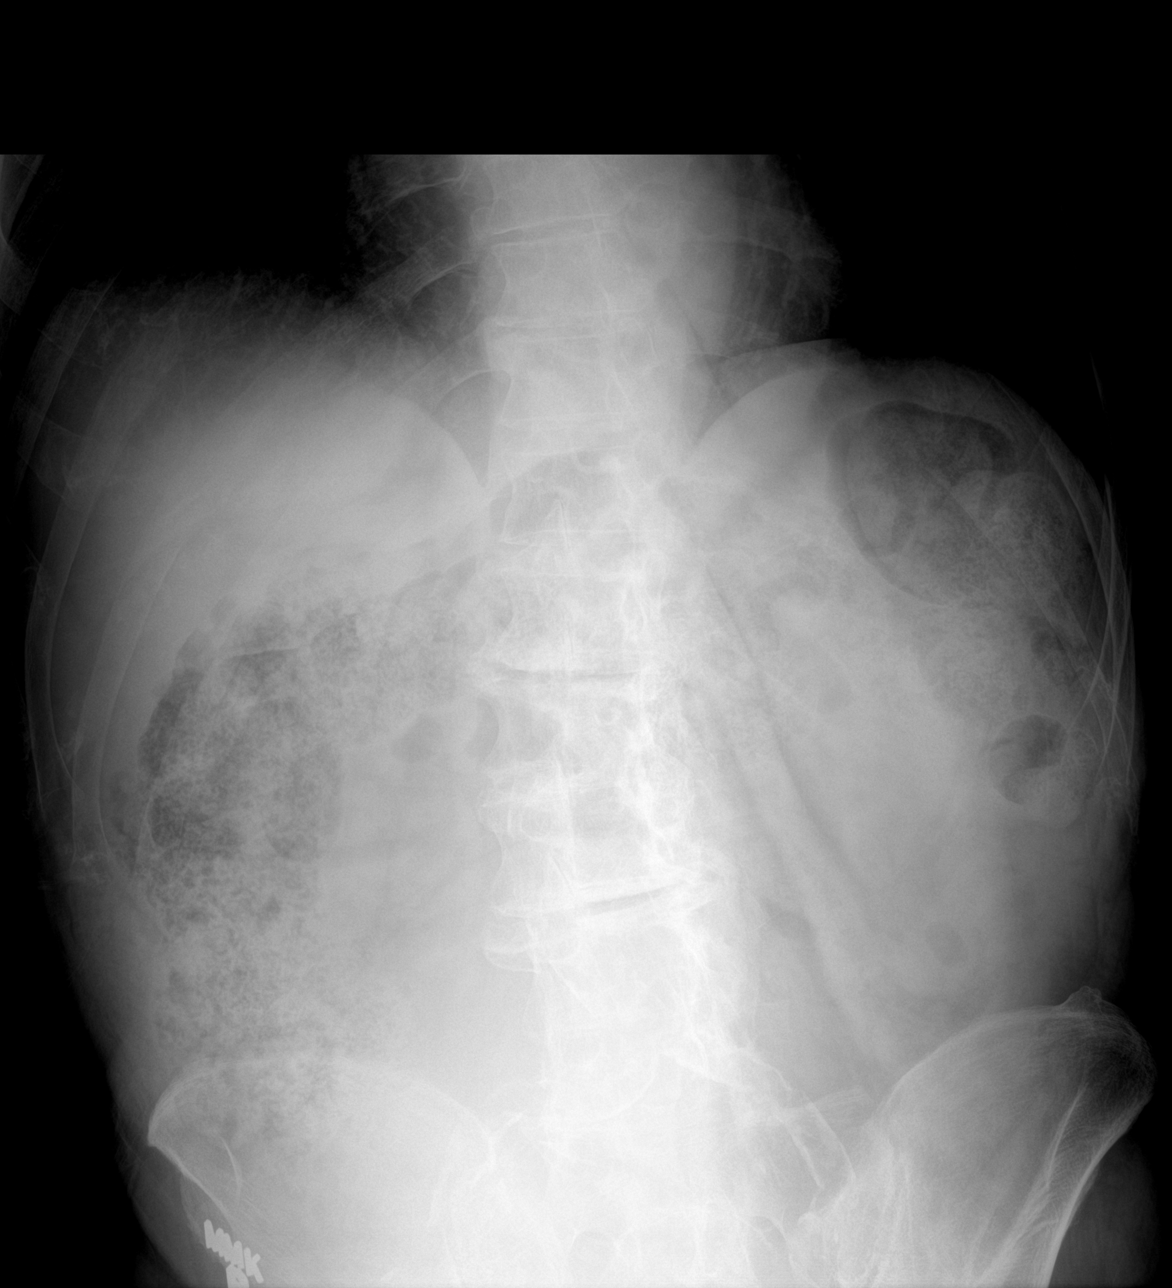

[3 of 3 positions shown; findings below may reference images not displayed]

FINDINGS: No dilated small bowel loops. Large colonic stool volume, most
prominent in the right and transverse colon. No evidence of
pneumatosis or pneumoperitoneum. Clear lung bases. No radiopaque
nephrolithiasis. Marked lumbar spondylosis.
IMPRESSION: Nonobstructive bowel gas pattern. Large colonic stool volume, most
prominent in the right and transverse colon, suggesting
constipation.

## 2022-09-22 IMAGING — CT CT ABD-PELV W/ CM
2 of 5 series · 16 of 46 positions shown, 18 images · IV contrast (omnipaque)
Comparison: None.

CLINICAL DATA: Left lower quadrant pain

EXAM:
CT ABDOMEN AND PELVIS WITH CONTRAST
TECHNIQUE: Multidetector CT imaging of the abdomen and pelvis was performed
using the standard protocol following bolus administration of
intravenous contrast.
CONTRAST:  75mL OMNIPAQUE IOHEXOL 350 MG/ML SOLN

[Series 3: abdomen 5.0 · axial · 0.86mm/px · z∈[-82,+283]mm · 13 of 87 slices shown, 15 images]
[im 7/87  soft-tissue]
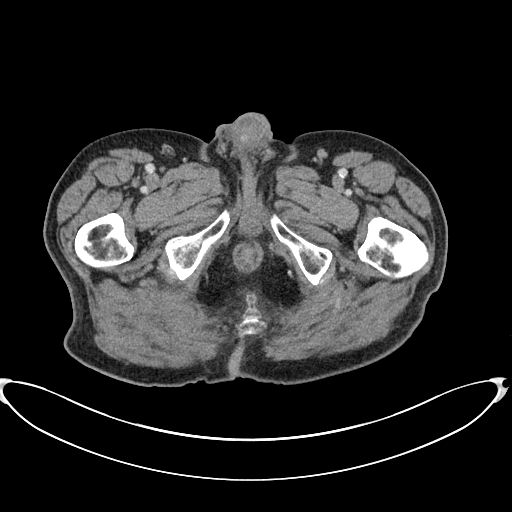
[im 7/87  bone]
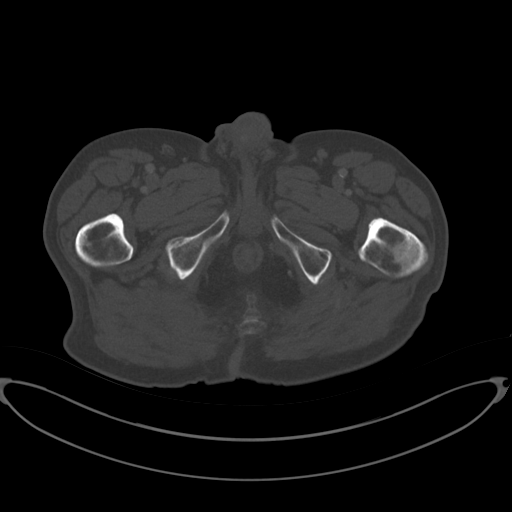
[im 13/87  soft-tissue]
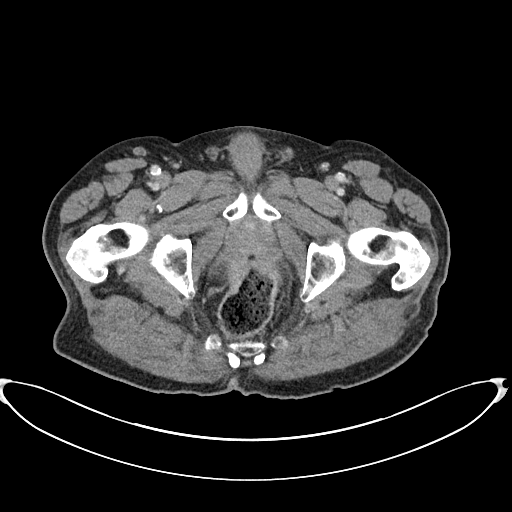
[im 19/87  soft-tissue]
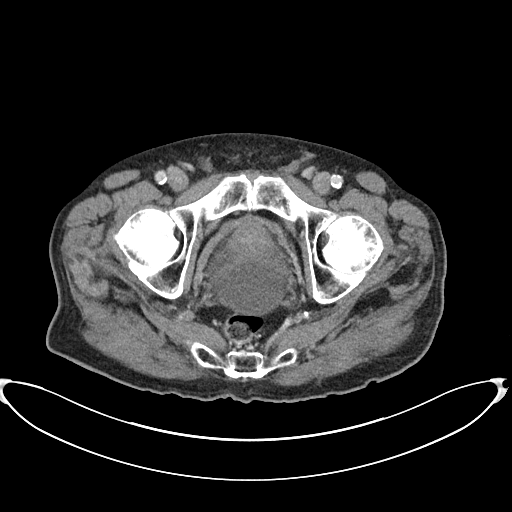
[im 25/87  soft-tissue]
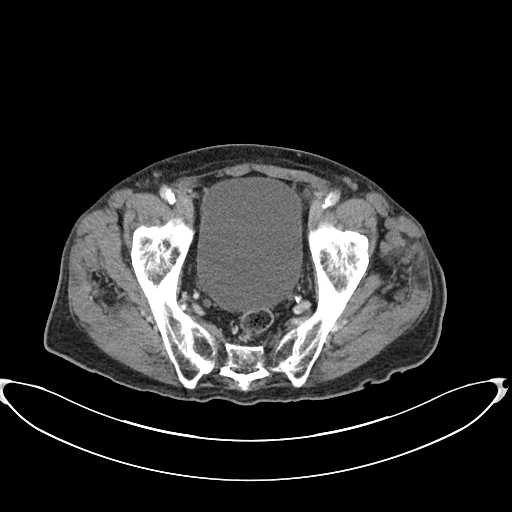
[im 31/87  soft-tissue]
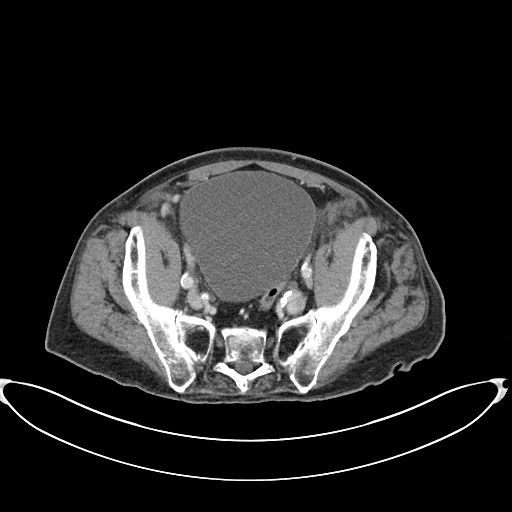
[im 37/87  soft-tissue]
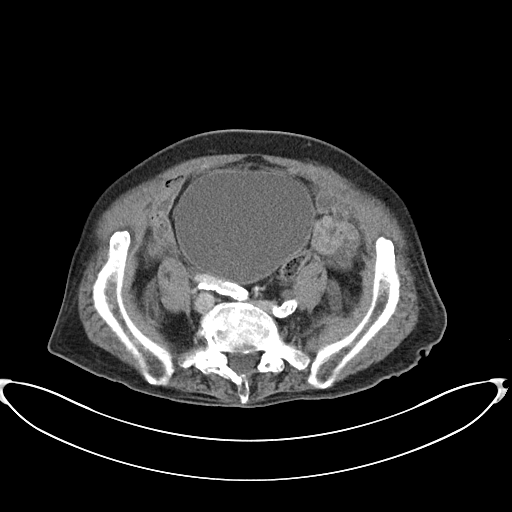
[im 44/87  soft-tissue]
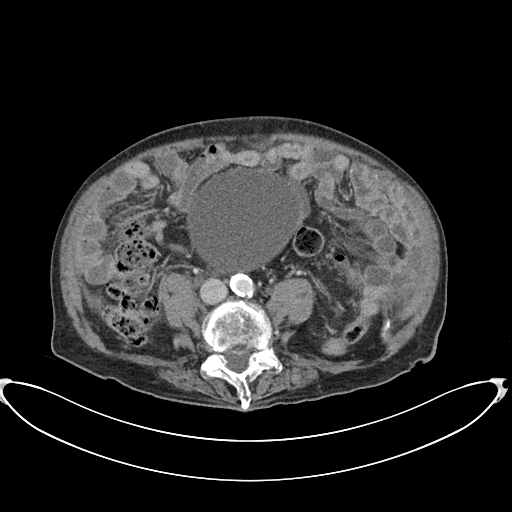
[im 50/87  soft-tissue]
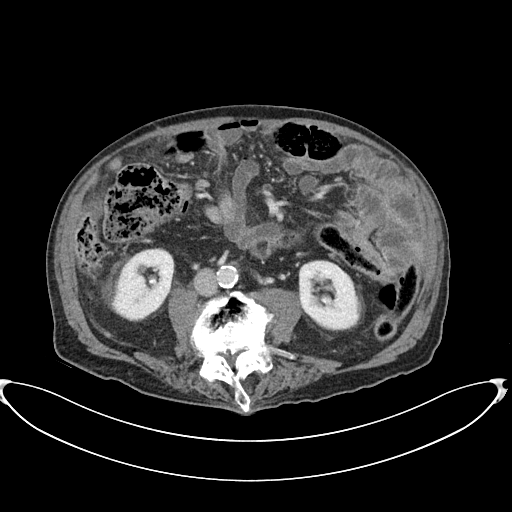
[im 56/87  soft-tissue]
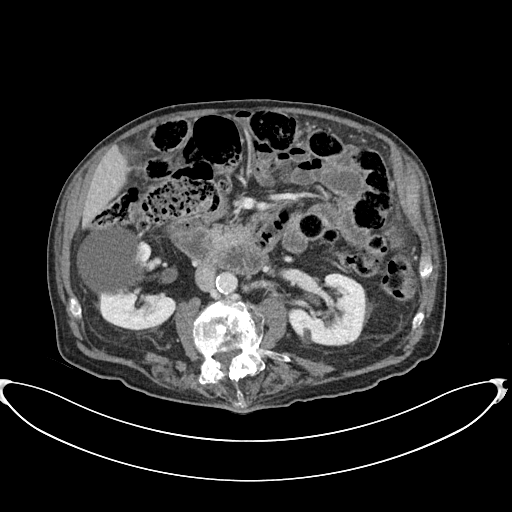
[im 56/87  bone]
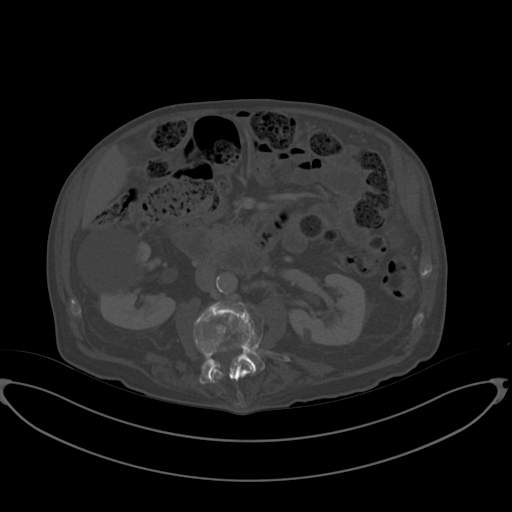
[im 62/87  soft-tissue]
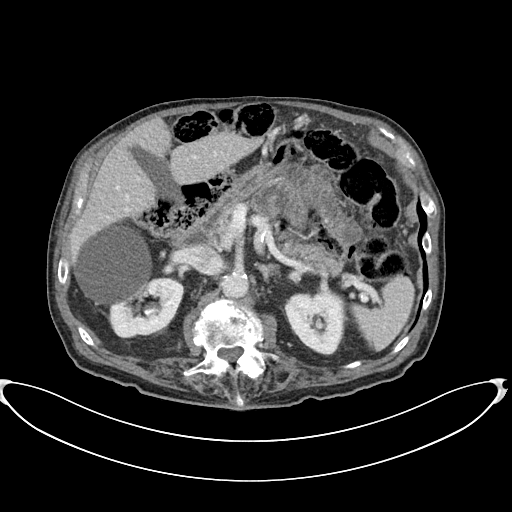
[im 68/87  soft-tissue]
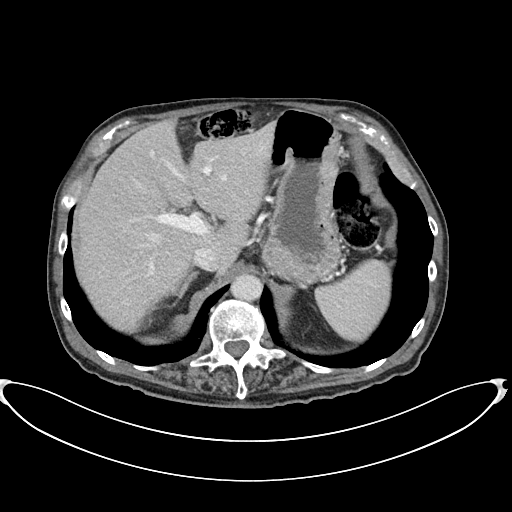
[im 74/87  soft-tissue]
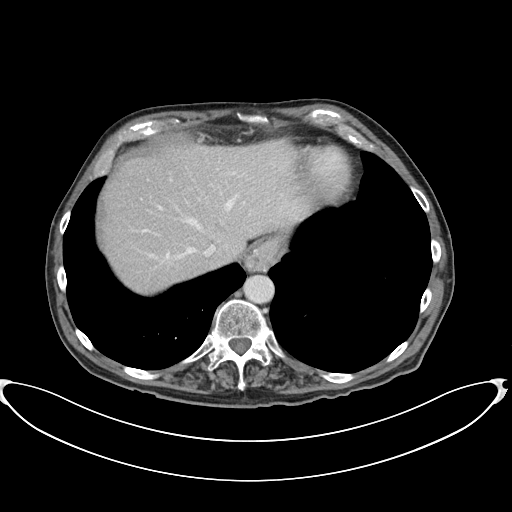
[im 80/87  soft-tissue]
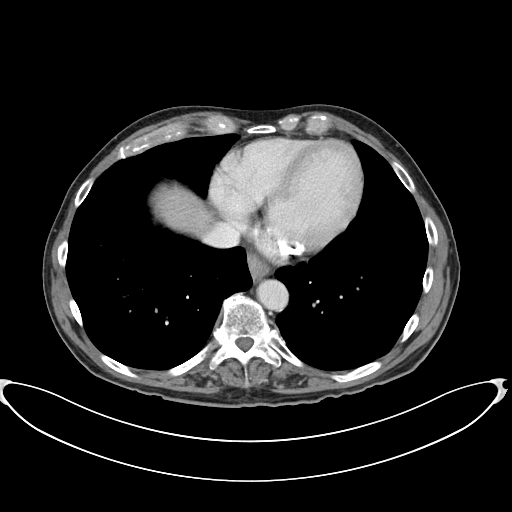

[Series 6: abdomen 3.0 mpr cor · coronal · 0.78mm/px · 3 of 91 slices shown]
[im 31/91  soft-tissue]
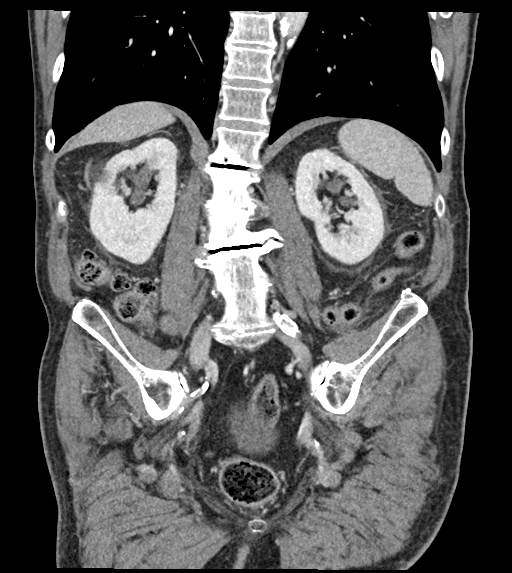
[im 41/91  soft-tissue]
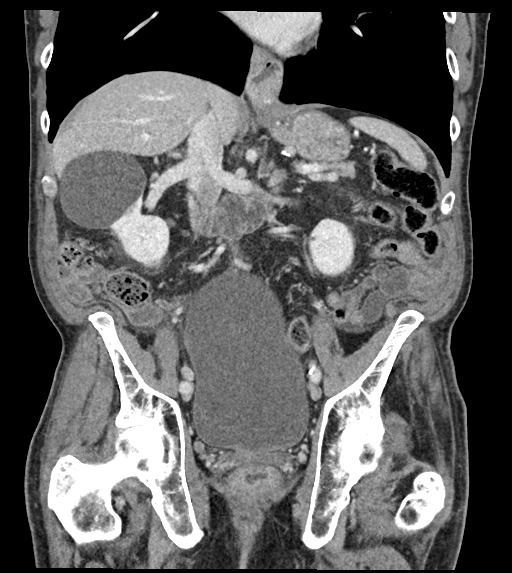
[im 51/91  soft-tissue]
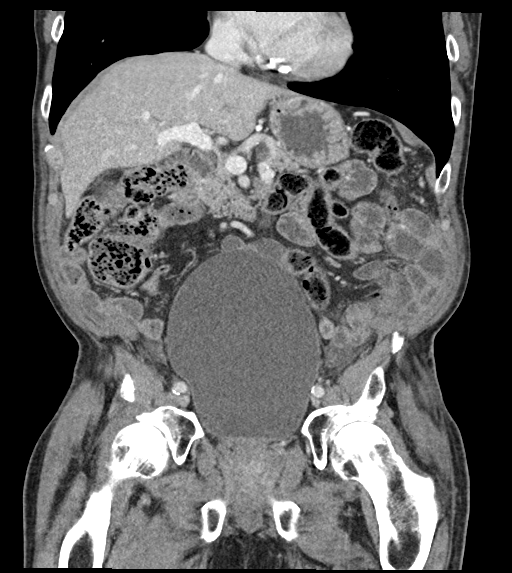

[16 of 46 positions shown; findings below may reference images not displayed]

FINDINGS: Lower chest: Lung bases demonstrate no acute consolidation or
effusion. Normal cardiac size. Coronary vascular calcification.
Small hiatal hernia.

Hepatobiliary: No calcified gallstone. No focal hepatic abnormality.
No biliary dilatation

Pancreas: Unremarkable. No pancreatic ductal dilatation or
surrounding inflammatory changes.

Spleen: Normal in size without focal abnormality.

Adrenals/Urinary Tract: Adrenal glands are normal. Kidneys show no
hydronephrosis. Cysts in the bilateral kidneys. Subcentimeter
hypodensities too small to further characterize. Punctate stones
versus early excreted contrast at the lower poles of the kidneys.
Bladder is distended

Stomach/Bowel: The stomach is nonenlarged. No dilated small bowel.
Moderate stool throughout colon. Negative appendix. No acute bowel
wall thickening

Vascular/Lymphatic: Advanced aortic atherosclerosis. No aneurysm. No
suspicious nodes.

Reproductive: Prostate is unremarkable.

Other: Negative for free air. Trace fluid within the lower
quadrants.

Musculoskeletal: No acute osseous abnormality. Postsurgical changes
of the lumbar spine. Fusion L2-L3 and L4-L5 vertebral bodies.
IMPRESSION: 1. No CT evidence for acute intra-abdominal or pelvic abnormality.
Moderate stool burden.
2. Trace free fluid in the lower quadrants.

## 2022-09-24 ENCOUNTER — Encounter (HOSPITAL_COMMUNITY)
Admission: RE | Admit: 2022-09-24 | Discharge: 2022-09-24 | Disposition: A | Discharge status: Home / Self Care | Payer: Medicare Other | Source: Ambulatory Visit | Attending: Urology | Admitting: Urology

## 2022-09-24 DIAGNOSIS — C61 Malignant neoplasm of prostate: Secondary | ICD-10-CM | POA: Insufficient documentation

## 2022-09-24 MED ORDER — PIFLIFOLASTAT F 18 (PYLARIFY) INJECTION
9.0000 | Freq: Once | INTRAVENOUS | Status: AC
  Administered 2022-09-24: 9 via INTRAVENOUS

## 2022-10-03 DIAGNOSIS — D2920 Benign neoplasm of unspecified testis: Secondary | ICD-10-CM | POA: Diagnosis not present

## 2022-10-03 DIAGNOSIS — C61 Malignant neoplasm of prostate: Secondary | ICD-10-CM | POA: Diagnosis not present

## 2022-10-11 DIAGNOSIS — Z23 Encounter for immunization: Secondary | ICD-10-CM | POA: Diagnosis not present

## 2022-10-26 ENCOUNTER — Ambulatory Visit (HOSPITAL_COMMUNITY): Payer: Medicare Other | Attending: Cardiovascular Disease

## 2022-10-26 DIAGNOSIS — I340 Nonrheumatic mitral (valve) insufficiency: Secondary | ICD-10-CM

## 2022-10-26 LAB — ECHOCARDIOGRAM COMPLETE
Area-P 1/2: 4.68 cm2
MV M vel: 6.34 m/s
MV Peak grad: 160.8 mmHg
Radius: 0.5 cm
S' Lateral: 4.2 cm

## 2022-11-06 ENCOUNTER — Other Ambulatory Visit: Payer: Self-pay

## 2022-11-06 ENCOUNTER — Emergency Department (HOSPITAL_COMMUNITY): Payer: Medicare Other

## 2022-11-06 ENCOUNTER — Encounter (HOSPITAL_COMMUNITY): Payer: Self-pay

## 2022-11-06 ENCOUNTER — Inpatient Hospital Stay (HOSPITAL_COMMUNITY)
Admission: EM | Admit: 2022-11-06 | Discharge: 2022-11-08 | DRG: 378 | Disposition: A | Discharge status: Home / Self Care | Payer: Medicare Other | Attending: Internal Medicine | Admitting: Internal Medicine

## 2022-11-06 DIAGNOSIS — I340 Nonrheumatic mitral (valve) insufficiency: Secondary | ICD-10-CM | POA: Diagnosis not present

## 2022-11-06 DIAGNOSIS — E782 Mixed hyperlipidemia: Secondary | ICD-10-CM | POA: Diagnosis not present

## 2022-11-06 DIAGNOSIS — K222 Esophageal obstruction: Secondary | ICD-10-CM | POA: Diagnosis present

## 2022-11-06 DIAGNOSIS — K5900 Constipation, unspecified: Secondary | ICD-10-CM | POA: Diagnosis present

## 2022-11-06 DIAGNOSIS — I7300 Raynaud's syndrome without gangrene: Secondary | ICD-10-CM | POA: Diagnosis present

## 2022-11-06 DIAGNOSIS — L4050 Arthropathic psoriasis, unspecified: Secondary | ICD-10-CM | POA: Diagnosis not present

## 2022-11-06 DIAGNOSIS — M549 Dorsalgia, unspecified: Secondary | ICD-10-CM | POA: Diagnosis present

## 2022-11-06 DIAGNOSIS — E119 Type 2 diabetes mellitus without complications: Secondary | ICD-10-CM | POA: Diagnosis present

## 2022-11-06 DIAGNOSIS — K219 Gastro-esophageal reflux disease without esophagitis: Secondary | ICD-10-CM | POA: Diagnosis present

## 2022-11-06 DIAGNOSIS — N429 Disorder of prostate, unspecified: Secondary | ICD-10-CM | POA: Diagnosis not present

## 2022-11-06 DIAGNOSIS — I700 Atherosclerosis of aorta: Secondary | ICD-10-CM | POA: Diagnosis present

## 2022-11-06 DIAGNOSIS — Z8249 Family history of ischemic heart disease and other diseases of the circulatory system: Secondary | ICD-10-CM | POA: Diagnosis not present

## 2022-11-06 DIAGNOSIS — K551 Chronic vascular disorders of intestine: Secondary | ICD-10-CM | POA: Diagnosis not present

## 2022-11-06 DIAGNOSIS — Q438 Other specified congenital malformations of intestine: Secondary | ICD-10-CM

## 2022-11-06 DIAGNOSIS — R197 Diarrhea, unspecified: Secondary | ICD-10-CM | POA: Diagnosis not present

## 2022-11-06 DIAGNOSIS — K5731 Diverticulosis of large intestine without perforation or abscess with bleeding: Secondary | ICD-10-CM | POA: Diagnosis present

## 2022-11-06 DIAGNOSIS — I959 Hypotension, unspecified: Secondary | ICD-10-CM | POA: Diagnosis not present

## 2022-11-06 DIAGNOSIS — D62 Acute posthemorrhagic anemia: Secondary | ICD-10-CM | POA: Diagnosis present

## 2022-11-06 DIAGNOSIS — Z923 Personal history of irradiation: Secondary | ICD-10-CM | POA: Diagnosis not present

## 2022-11-06 DIAGNOSIS — G8929 Other chronic pain: Secondary | ICD-10-CM | POA: Diagnosis not present

## 2022-11-06 DIAGNOSIS — Z79890 Hormone replacement therapy (postmenopausal): Secondary | ICD-10-CM | POA: Diagnosis not present

## 2022-11-06 DIAGNOSIS — R55 Syncope and collapse: Secondary | ICD-10-CM | POA: Diagnosis not present

## 2022-11-06 DIAGNOSIS — E876 Hypokalemia: Secondary | ICD-10-CM | POA: Diagnosis not present

## 2022-11-06 DIAGNOSIS — I871 Compression of vein: Secondary | ICD-10-CM | POA: Diagnosis present

## 2022-11-06 DIAGNOSIS — K922 Gastrointestinal hemorrhage, unspecified: Secondary | ICD-10-CM | POA: Diagnosis not present

## 2022-11-06 DIAGNOSIS — K449 Diaphragmatic hernia without obstruction or gangrene: Secondary | ICD-10-CM | POA: Diagnosis not present

## 2022-11-06 DIAGNOSIS — Z79899 Other long term (current) drug therapy: Secondary | ICD-10-CM | POA: Diagnosis not present

## 2022-11-06 DIAGNOSIS — E890 Postprocedural hypothyroidism: Secondary | ICD-10-CM | POA: Diagnosis present

## 2022-11-06 DIAGNOSIS — K5791 Diverticulosis of intestine, part unspecified, without perforation or abscess with bleeding: Secondary | ICD-10-CM | POA: Diagnosis not present

## 2022-11-06 DIAGNOSIS — K5730 Diverticulosis of large intestine without perforation or abscess without bleeding: Secondary | ICD-10-CM | POA: Diagnosis not present

## 2022-11-06 DIAGNOSIS — I10 Essential (primary) hypertension: Secondary | ICD-10-CM | POA: Diagnosis present

## 2022-11-06 DIAGNOSIS — Z85850 Personal history of malignant neoplasm of thyroid: Secondary | ICD-10-CM | POA: Diagnosis not present

## 2022-11-06 DIAGNOSIS — Z8719 Personal history of other diseases of the digestive system: Secondary | ICD-10-CM

## 2022-11-06 DIAGNOSIS — I119 Hypertensive heart disease without heart failure: Secondary | ICD-10-CM | POA: Diagnosis not present

## 2022-11-06 DIAGNOSIS — E785 Hyperlipidemia, unspecified: Secondary | ICD-10-CM | POA: Diagnosis not present

## 2022-11-06 DIAGNOSIS — K3189 Other diseases of stomach and duodenum: Secondary | ICD-10-CM | POA: Diagnosis not present

## 2022-11-06 DIAGNOSIS — C61 Malignant neoplasm of prostate: Secondary | ICD-10-CM | POA: Diagnosis present

## 2022-11-06 DIAGNOSIS — I341 Nonrheumatic mitral (valve) prolapse: Secondary | ICD-10-CM | POA: Diagnosis present

## 2022-11-06 DIAGNOSIS — R0689 Other abnormalities of breathing: Secondary | ICD-10-CM | POA: Diagnosis not present

## 2022-11-06 DIAGNOSIS — W19XXXA Unspecified fall, initial encounter: Secondary | ICD-10-CM | POA: Diagnosis not present

## 2022-11-06 DIAGNOSIS — E871 Hypo-osmolality and hyponatremia: Secondary | ICD-10-CM | POA: Diagnosis present

## 2022-11-06 HISTORY — DX: Personal history of other diseases of the digestive system: Z87.19

## 2022-11-06 LAB — COMPREHENSIVE METABOLIC PANEL
ALT: 25 U/L (ref 0–44)
ALT: 24 U/L (ref 0–44)
AST: 28 U/L (ref 15–41)
AST: 26 U/L (ref 15–41)
Albumin: 3.5 g/dL (ref 3.5–5.0)
Albumin: 3.4 g/dL — ABNORMAL LOW (ref 3.5–5.0)
Alkaline Phosphatase: 48 U/L (ref 38–126)
Alkaline Phosphatase: 45 U/L (ref 38–126)
Anion gap: 12 (ref 5–15)
Anion gap: 9 (ref 5–15)
BUN: 18 mg/dL (ref 8–23)
BUN: 15 mg/dL (ref 8–23)
CO2: 26 mmol/L (ref 22–32)
CO2: 24 mmol/L (ref 22–32)
Calcium: 8.4 mg/dL — ABNORMAL LOW (ref 8.9–10.3)
Calcium: 8.4 mg/dL — ABNORMAL LOW (ref 8.9–10.3)
Chloride: 96 mmol/L — ABNORMAL LOW (ref 98–111)
Chloride: 104 mmol/L (ref 98–111)
Creatinine, Ser: 0.84 mg/dL (ref 0.61–1.24)
Creatinine, Ser: 0.71 mg/dL (ref 0.61–1.24)
GFR, Estimated: >60 mL/min (ref >60)
GFR, Estimated: >60 mL/min (ref >60)
Glucose, Bld: 114 mg/dL — ABNORMAL HIGH (ref 70–99)
Glucose, Bld: 116 mg/dL — ABNORMAL HIGH (ref 70–99)
Potassium: 3.3 mmol/L — ABNORMAL LOW (ref 3.5–5.1)
Potassium: 3.6 mmol/L (ref 3.5–5.1)
Sodium: 134 mmol/L — ABNORMAL LOW (ref 135–145)
Sodium: 137 mmol/L (ref 135–145)
Total Bilirubin: 0.4 mg/dL (ref 0.3–1.2)
Total Bilirubin: 0.6 mg/dL (ref 0.3–1.2)
Total Protein: 5.6 g/dL — ABNORMAL LOW (ref 6.5–8.1)
Total Protein: 5.1 g/dL — ABNORMAL LOW (ref 6.5–8.1)

## 2022-11-06 LAB — CBC WITH DIFFERENTIAL/PLATELET
Abs Immature Granulocytes: 0.01 10*3/uL (ref 0.00–0.07)
Abs Immature Granulocytes: 0.01 10*3/uL (ref 0.00–0.07)
Basophils Absolute: 0 10*3/uL (ref 0.0–0.1)
Basophils Absolute: 0 10*3/uL (ref 0.0–0.1)
Basophils Relative: 0 %
Basophils Relative: 0 %
Eosinophils Absolute: 0.1 10*3/uL (ref 0.0–0.5)
Eosinophils Absolute: 0 10*3/uL (ref 0.0–0.5)
Eosinophils Relative: 3 %
Eosinophils Relative: 1 %
HCT: 30.8 % — ABNORMAL LOW (ref 39.0–52.0)
HCT: 29 % — ABNORMAL LOW (ref 39.0–52.0)
Hemoglobin: 10.4 g/dL — ABNORMAL LOW (ref 13.0–17.0)
Hemoglobin: 10.1 g/dL — ABNORMAL LOW (ref 13.0–17.0)
Immature Granulocytes: 0 %
Immature Granulocytes: 0 %
Lymphocytes Relative: 19 %
Lymphocytes Relative: 18 %
Lymphs Abs: 0.9 10*3/uL (ref 0.7–4.0)
Lymphs Abs: 0.7 10*3/uL (ref 0.7–4.0)
MCH: 31.4 pg (ref 26.0–34.0)
MCH: 32.1 pg (ref 26.0–34.0)
MCHC: 33.8 g/dL (ref 30.0–36.0)
MCHC: 34.8 g/dL (ref 30.0–36.0)
MCV: 93.1 fL (ref 80.0–100.0)
MCV: 92.1 fL (ref 80.0–100.0)
Monocytes Absolute: 0.6 10*3/uL (ref 0.1–1.0)
Monocytes Absolute: 0.5 10*3/uL (ref 0.1–1.0)
Monocytes Relative: 13 %
Monocytes Relative: 12 %
Neutro Abs: 3 10*3/uL (ref 1.7–7.7)
Neutro Abs: 2.8 10*3/uL (ref 1.7–7.7)
Neutrophils Relative %: 65 %
Neutrophils Relative %: 69 %
Platelets: 146 10*3/uL — ABNORMAL LOW (ref 150–400)
Platelets: 140 10*3/uL — ABNORMAL LOW (ref 150–400)
RBC: 3.31 MIL/uL — ABNORMAL LOW (ref 4.22–5.81)
RBC: 3.15 MIL/uL — ABNORMAL LOW (ref 4.22–5.81)
RDW: 12.6 % (ref 11.5–15.5)
RDW: 12.7 % (ref 11.5–15.5)
WBC: 4.7 10*3/uL (ref 4.0–10.5)
WBC: 4.1 10*3/uL (ref 4.0–10.5)
nRBC: 0 % (ref 0.0–0.2)
nRBC: 0 % (ref 0.0–0.2)

## 2022-11-06 LAB — PROTIME-INR
INR: 1.1 (ref 0.8–1.2)
Prothrombin Time: 14.2 seconds (ref 11.4–15.2)

## 2022-11-06 LAB — POC OCCULT BLOOD, ED: Fecal Occult Bld: POSITIVE — AB

## 2022-11-06 LAB — LACTIC ACID, PLASMA: Lactic Acid, Venous: 1.3 mmol/L (ref 0.5–1.9)

## 2022-11-06 MED ORDER — PEG-KCL-NACL-NASULF-NA ASC-C 100 G PO SOLR
0.5000 | Freq: Once | ORAL | Status: AC
  Administered 2022-11-06: 100 g via ORAL

## 2022-11-06 MED ORDER — ONDANSETRON HCL 4 MG/2ML IJ SOLN: 4.0000 mg | Freq: Three times a day (TID) | INTRAMUSCULAR | Status: DC | PRN

## 2022-11-06 MED ORDER — LEVOTHYROXINE SODIUM 75 MCG PO TABS
75.0000 ug | ORAL_TABLET | ORAL | Status: DC
  Administered 2022-11-06 – 2022-11-08 (×2): 75 ug via ORAL
  Filled 2022-11-06 (×2): qty 1

## 2022-11-06 MED ORDER — PEG-KCL-NACL-NASULF-NA ASC-C 100 G PO SOLR: 1.0000 | Freq: Once | ORAL | Status: DC

## 2022-11-06 MED ORDER — METOCLOPRAMIDE HCL 5 MG/ML IJ SOLN
10.0000 mg | Freq: Four times a day (QID) | INTRAMUSCULAR | Status: AC
  Administered 2022-11-06 (×2): 10 mg via INTRAVENOUS
  Filled 2022-11-06 (×2): qty 2

## 2022-11-06 MED ORDER — LEVOTHYROXINE SODIUM 50 MCG PO TABS
50.0000 ug | ORAL_TABLET | ORAL | Status: DC
  Administered 2022-11-07: 50 ug via ORAL
  Filled 2022-11-06: qty 1

## 2022-11-06 MED ORDER — PANTOPRAZOLE SODIUM 40 MG PO TBEC
40.0000 mg | DELAYED_RELEASE_TABLET | Freq: Every day | ORAL | Status: DC
  Administered 2022-11-07 – 2022-11-08 (×2): 40 mg via ORAL
  Filled 2022-11-06 (×3): qty 1

## 2022-11-06 MED ORDER — PEG-KCL-NACL-NASULF-NA ASC-C 100 G PO SOLR
0.5000 | Freq: Once | ORAL | Status: AC
  Administered 2022-11-06: 100 g via ORAL
  Filled 2022-11-06: qty 1

## 2022-11-06 MED ORDER — IOHEXOL 350 MG/ML SOLN
75.0000 mL | Freq: Once | INTRAVENOUS | Status: AC | PRN
  Administered 2022-11-06: 75 mL via INTRAVENOUS

## 2022-11-06 MED ORDER — ROSUVASTATIN CALCIUM 5 MG PO TABS
10.0000 mg | ORAL_TABLET | Freq: Every day | ORAL | Status: DC
  Administered 2022-11-06 – 2022-11-08 (×3): 10 mg via ORAL
  Filled 2022-11-06 (×3): qty 2

## 2022-11-06 MED ORDER — BISACODYL 5 MG PO TBEC
20.0000 mg | DELAYED_RELEASE_TABLET | Freq: Once | ORAL | Status: AC
  Administered 2022-11-06: 20 mg via ORAL
  Filled 2022-11-06: qty 4

## 2022-11-06 MED ORDER — SODIUM CHLORIDE 0.9 % IV BOLUS: 1000.0000 mL | Freq: Once | INTRAVENOUS | Status: DC

## 2022-11-06 MED ORDER — ACETAMINOPHEN 325 MG PO TABS: 650.0000 mg | ORAL_TABLET | Freq: Four times a day (QID) | ORAL | Status: DC | PRN

## 2022-11-06 MED ORDER — PANTOPRAZOLE SODIUM 40 MG IV SOLR
40.0000 mg | Freq: Once | INTRAVENOUS | Status: AC
  Administered 2022-11-06: 40 mg via INTRAVENOUS
  Filled 2022-11-06: qty 10

## 2022-11-06 MED ORDER — ONDANSETRON HCL 4 MG PO TABS: 4.0000 mg | ORAL_TABLET | Freq: Three times a day (TID) | ORAL | Status: DC | PRN

## 2022-11-06 MED ORDER — METHOCARBAMOL 500 MG PO TABS: 500.0000 mg | ORAL_TABLET | Freq: Two times a day (BID) | ORAL | Status: DC | PRN

## 2022-11-06 MED ORDER — LACTATED RINGERS IV BOLUS
1000.0000 mL | Freq: Once | INTRAVENOUS | Status: AC
  Administered 2022-11-06: 1000 mL via INTRAVENOUS

## 2022-11-06 NOTE — H&P (Cosign Needed Addendum)
Date: 11/06/2022               Patient Name:  Cristian Nunez MRN: [id number]  DOB: 08/15/1946 Age / Sex: 76 y.o., male   PCP: Renaldo Reel, PA         Medical Service: Internal Medicine Teaching Service         Attending Physician: Dr. Jimmye Norman, Elaina Pattee, MD    First Contact: Dr. Angelique Blonder, DO Pager: [phone]   Second Contact: Dr. Sanjuana Letters, DO Pager: [phone]       After Hours (After 5p/  First Contact Pager: [phone]  weekends / holidays): Second Contact Pager: [phone]   Chief Complaint: Syncope, GI bleed  History of Present Illness: [name] is a 76 year old male with PMH of HTN, HLD, GERD, thyroid cancer s/p thyroidectomy, recently diagnosed prostate cancer, psoriatic arthritis with Raynaud's phenomenon who presented to the ER for your syncope episode. Patient and spouse states that since yesterday afternoon, he has been passing dark stools. He continued to have multiple bloody bowel movements throughout the day and at night. This morning, he had another bloody BM and as he was walking back to his bed, he felt flushed and had a syncope episode. He reports being unconscious for few seconds and had an episode of urinary incontinence. Did not have any confusion after the episode.  He denies any nausea, vomiting, shortness of breath, chest pain or weakness.  Reports a chronic back pain that he takes 2 Excedrin daily for but denies any other NSAID use. He denies any history of GI bleed. He is not on any blood thinners. He was recently diagnosed with prostate cancer in October and has follow-up with radiation oncology this Friday to prep for radiation.   ED course: Labs significant for positive Hemoccult, Hgb 10.4 from 15.3 (1 yr ago). Hypokalemia 3.3, sCr 0.84, PT/INR 14.2/1.1. Hemodynamically stable with SBP in the 110s to 130s.  Found to have bright red blood per rectum on DRE by ED provider. Given 1L IVLR and IV Protonix 40 mg x1 dose. GI consulted for evaluation and  IMTS consulted for admission.   Meds:  Current Meds  Medication Sig   acetaminophen (TYLENOL) 500 MG tablet Take 500 mg by mouth every 6 (six) hours as needed for moderate pain.   amLODipine (NORVASC) 5 MG tablet Take 5 mg by mouth daily.   amoxicillin (AMOXIL) 500 MG capsule Take 500 mg by mouth 3 (three) times daily as needed ("outbreaks around my mouth"). Pt reports taking this prn for outbreaks and will usually take it tid for about 3 days every 3ish months   aspirin-acetaminophen-caffeine (EXCEDRIN MIGRAINE) 250-250-65 MG tablet Take 1 tablet by mouth every 6 (six) hours as needed for headache.   b complex vitamins tablet Take 1 tablet by mouth daily.   clobetasol ointment (TEMOVATE) 1.75 % Apply 1 application topically 2 (two) times daily as needed (irritation).   ketoconazole (NIZORAL) 2 % shampoo Apply 1 application topically every 30 (thirty) days.   levothyroxine (SYNTHROID) 50 MCG tablet Take 50 mcg by mouth every other day.   levothyroxine (SYNTHROID) 75 MCG tablet Take 75 mcg by mouth every other day.   loperamide (IMODIUM) 2 MG capsule Take 2 mg by mouth as needed for diarrhea or loose stools.   methocarbamol (ROBAXIN) 500 MG tablet Take 500 mg by mouth every 12 (twelve) hours as needed for muscle pain. for pain   omeprazole (PRILOSEC) 20 MG capsule Take 20  mg by mouth daily.   pseudoephedrine (SUDAFED) 30 MG tablet Take 30 mg by mouth daily as needed for congestion.   rosuvastatin (CRESTOR) 10 MG tablet Take 10 mg by mouth daily.   triamcinolone cream (KENALOG) 0.1 % Apply 1 application  topically as needed (itching).     Allergies: Allergies as of 11/06/2022   (No Known Allergies)   Past Medical History:  Diagnosis Date   Abnormal EKG 12/09/2018   Arthritis    psoriatic   Esophageal stricture    Essential hypertension 12/09/2018   GERD (gastroesophageal reflux disease)    History of thyroid cancer    thyroid   Hyperlipidemia    Hypertension    Hypothyroidism     90% thyroidectomy 1973   Mitral regurgitation 11/18/2019   Mitral valve prolapse    Mixed dyslipidemia 12/09/2018   Psoriatic arthritis ([place])    Raynaud's syndrome    Thyroid cancer ([place])     Family History: Father had a history of pancreatic cancer and multiple strokes. Mother and grandmother with history of heart disease.  Social History: Works as a Software engineer, retired 13 years ago. Currently lives with his wife and independent with all ADLs and IADLs. Ambulates with 2 sticks to help with balance. Drinks 2-3 beers per day for about 50 years. Denies tobacco use or illicit drug use.  Review of Systems: A complete ROS was negative except as per HPI.   Physical Exam: Blood pressure 126/82, pulse 79, temperature 98.3 F (36.8 C), temperature source Oral, resp. rate 18, height '5\' 8"'$  (1.727 m), weight 72.6 kg, SpO2 98 %.  General: Pleasant, well-appearing elderly male laying in bed. No acute distress. CV: RRR. No murmurs, rubs, or gallops. Pulmonary: Lungs CTAB. Normal effort. No wheezing or rales. Abdominal: Soft, nontender, nondistended. Normal bowel sounds. Extremities: Well-perfused. Radial and DP pulses 2+ and symmetric. No LE edema or LE asymmetry. Mild varicose veins in the RLE.  Skin: Warm and dry. No obvious rash or lesions. Neuro: A&Ox3. Moves all extremities. Normal sensation. No focal deficit. Psych: Normal mood and affect  EKG: personally reviewed my interpretation is sinus with PVCs and LVH.  CT angio GI bleed: No evidence of bleed in the abdomen or pelvis. 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 Thickening of the stomach wall concerning for gastritis.  Assessment & Plan by Problem: Principal Problem:   GI bleed  [name] is a 76 year old male with PMH of HTN, HLD, GERD, thyroid cancer s/p thyroidectomy, recently diagnosed prostate cancer, psoriatic arthritis with Raynaud's  phenomenon who presented to the ER for syncope episode and found to have GI bleed.  #Syncope #GI bleed #Hematochezia Patient with no history of GI bleed presented after a syncope episode in the setting of multiple GI bleeds. Patient's syncope likely secondary to decreased brain perfusion from acute blood loss. Patient has history of constipation as well as acute hematochezia concerning for possible diverticular bleed however no diverticulosis noticed on CTA. BUN/sCr ratio < 30, making upper GI bleed somewhat unlikely. CTA GI bleed series negative for active bleed in the abdomen or pelvis.  Patient remains hemodynamically stable. Hgb stable at 10.4->10.1. -GI consulted, appreciate recs -Pending colonoscopy tomorrow -Orthostatic vitals -Daily CBC -Transfuse for hgb < 7 -Clear liquid diet, NPO at 5 am tmr  #Prostate adenocarcinoma Per family, patient was recently diagnosed with prostate cancer in October.  PET scan 6 weeks ago showed prostate adenocarcinoma of  the posterior right aspect of the prostate, but no metastatic adenopathy, visceral or skeletal metastasis. Pending radiation treatment. -Follow-up scheduled with Dr. 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 No finding of LLE edema on exam. BLE well-perfused.  -VVS consulted by EDP -CTM  #HLD #HTN BP remains soft in the setting of GI bleed. -Hold home amlodipine -Continue Crestor 10 mg daily  #Thyroid cancer s/p thyroidectomy  # Hypothyroidism History of thyroid cancer status post 90% thyroidectomy 1973. Alternates 50 mcg and 75 mcg Synthroid daily. Last TSH 4.14 in July 2022.  -Continue alternating Synthroid 50 mcg and 75 mcg -Check TSH  #Psoriatic arthritis Has a history of raynaud's phenomenon.  No evidence of acute flare. -CTM  #Chronic back  pain #Hx L2-L3, L4-L5 vertebral body fusion Reports chronic back pain requiring daily Excedrin use as well as as needed Robaxin. -PRN Tylenol and Robaxin  #GERD On 20 mg of omeprazole at home. -Protonix 40 mg daily  Hx of esophageal stricture Due to the long upper esophageal stricture secondary to radiation. Underwent EGD with dilation and May 2022. No problems with swallowing. -CTM  CODE STATUS: Full DIET: Clear liquid PPx: SCDs  Dispo: Admit patient to Inpatient with expected length of stay greater than 2 midnights.  Signed: Lacinda Axon, MD 11/06/2022, 2:39 PM  Pager: [phone] Internal Medicine Teaching Service After 5pm on weekdays and 1pm on weekends: On Call pager: [phone]

## 2022-11-06 NOTE — Progress Notes (Signed)
GU Location of Tumor / Histology: Prostate Ca  If Prostate Cancer, Gleason Score is (4 + 3) and PSA is (6.6 as of 09/2022)  Biopsies      Past/Anticipated interventions by urology, if any:     Past/Anticipated interventions by medical oncology, if any:  NA  Weight changes, if any:  No  IPSS:  12 SHIM:  1  Bowel/Bladder complaints, if any: No, just had colonoscopy had some bleeding from rectum.  Nausea/Vomiting, if any: No  Pain issues, if any: 0/10  SAFETY ISSUES: Prior radiation? Yes, 1973 thyroid cancer,was given radiation website to review since it's bee a while. Pacemaker/ICD? No mitral valve prolapse not repaired yet and severe regurgitation. Possible current pregnancy?  Male Is the patient on methotrexate? No  Current Complaints / other details:

## 2022-11-06 NOTE — Anesthesia Preprocedure Evaluation (Signed)
Anesthesia Evaluation  Patient identified by MRN, date of birth, ID band Patient awake    Reviewed: Allergy & Precautions, NPO status , Patient's Chart, lab work & pertinent test results  History of Anesthesia Complications Negative for: history of anesthetic complications  Airway Mallampati: III  TM Distance: >3 FB Neck ROM: Full    Dental  (+) Poor Dentition   Pulmonary neg pulmonary ROS   Pulmonary exam normal breath sounds clear to auscultation       Cardiovascular hypertension, (-) angina (-) Past MI, (-) Cardiac Stents and (-) CABG + Valvular Problems/Murmurs (severe) MVP and MR  Rhythm:Regular Rate:Normal  HLD, Raynaud's  TTE 10/26/2022: IMPRESSIONS     1. Left ventricular ejection fraction, by estimation, is 50 to 55%. The  left ventricle has low normal function. The left ventricle demonstrates  global hypokinesis. The left ventricular internal cavity size was mildly  dilated. LVIDs 4.2 cm. There is  moderate asymmetric left ventricular hypertrophy of the basal-septal  segment. Left ventricular diastolic parameters are indeterminate.   2. Right ventricular systolic function is normal. The right ventricular  size is normal. There is normal pulmonary artery systolic pressure. The  estimated right ventricular systolic pressure is 16.6 mmHg.   3. Left atrial size was severely dilated.   4. 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 Severe mitral  valve regurgitation.   5. The aortic valve is tricuspid. Aortic valve regurgitation is not  visualized. No aortic stenosis is present.   6. Aortic dilatation noted. There is mild dilatation of the ascending  aorta, measuring 39 mm.   7. The inferior vena cava is normal in size with greater than 50%  respiratory variability, suggesting right atrial pressure of 3 mmHg.   RHC/LHC 02/03/2021: 1.  Patent coronary arteries with mild diffuse atherosclerotic disease noted.   There is diffuse calcification of the RCA in particular.  There is mild diffuse calcification of the LAD.  There is no more than 30 to 40% stenosis noted in any of the coronary vessels. 2.  Known severe mitral regurgitation with normal right heart hemodynamics 3.  Calcified mitral annulus is noted on plain fluoroscopy.     Neuro/Psych negative neurological ROS     GI/Hepatic Neg liver ROS,GERD  ,,Esophageal stricture   Endo/Other  neg diabetesHypothyroidism  H/o thyroid cancer s/p thyroidectomy  Renal/GU negative Renal ROS     Musculoskeletal  (+) Arthritis  (psoriatic),    Abdominal   Peds  Hematology negative hematology ROS (+)   Anesthesia Other Findings 76 year old male with PMH of HTN, HLD, GERD, thyroid cancer s/p thyroidectomy, recently diagnosed prostate cancer, psoriatic arthritis with Raynaud's phenomenon who presented to the ER for your syncope episode. Patient and spouse states that since yesterday afternoon, he has been passing dark stools. He continued to have multiple bloody bowel movements throughout the day and at night. On the day of admission, he had another bloody BM and as he was walking back to his bed, he felt flushed and had a syncope episode.  Reproductive/Obstetrics                              Anesthesia Physical Anesthesia Plan  ASA: 4  Anesthesia Plan: MAC   Post-op Pain Management:    Induction: Intravenous  PONV Risk Score and Plan: 1 and Propofol infusion and Treatment may vary due to age or medical condition  Airway Management Planned: Natural Airway and Nasal Cannula  Additional Equipment:   Intra-op Plan:   Post-operative Plan:   Informed Consent: I have reviewed the patients History and Physical, chart, labs and discussed the procedure including the risks, benefits and alternatives for the proposed anesthesia with the patient or authorized representative who has indicated his/her understanding and  acceptance.     Dental advisory given  Plan Discussed with: CRNA and Anesthesiologist  Anesthesia Plan Comments: (Discussed with patient risks of MAC including, but not limited to, minor pain or discomfort, hearing people in the room, and possible need for backup general anesthesia. Risks for general anesthesia also discussed including, but not limited to, sore throat, hoarse voice, chipped/damaged teeth, injury to vocal cords, nausea and vomiting, allergic reactions, lung infection, heart attack, stroke, and death. All questions answered. )        Anesthesia Quick Evaluation

## 2022-11-06 NOTE — H&P (View-Only) (Signed)
[facility] Consult: 10:35 AM 11/06/2022  LOS: 0 days   Benign appearing esophageal stenosis at UES.  Scope difficult to pass.  Underwent dilation to.  6 cm hiatal hernia without paraesophageal component.  GE flap valve Hill grade 4. Referring Provider:  Dr Armandina Gemma  Primary Care Physician:  Renaldo Reel, PA Primary Gastroenterologist:  Dr. Carlean Purl     Reason for Consultation:   Painless hematochezia   HPI: is Cristian Nunez is a 76 y.o. male.  PMH Advanced aortic atherosclerosis without aneurysm on CT in 08/2021.  Hyponatremia with sodium 128 14 months ago.  DM 2.  In his 57s tonsillitis was treated with radiation.  Developed thyroid cancer and underwent thyroidectomy in the 1970s.  Mitral valve prolapse.  Psoriatic arthritis, treated with monthly injections of Taltz.  Raynaud's Prior colonoscopy 10 or more years ago, patient recalls this as unremarkable and did not receive any call back letters.  Prostate biopsy confirming prostate cancer 09/12/2022.  Has upcoming appointment with Dr. Tresa Moore, rad onc, this coming Friday.   GI Evaluation with EGD in April/May 2022 for history of esophageal stricture, paraesophageal hiatal hernia, prior neck radiation, constipation.  Cardiology had not been able to pass the TEE scope. 02/2021 esophagram: Narrowing of esophagus at thoracic inlet sliding and paraesophageal mixed type hiatal hernia not well-evaluated due to patient positioning.  Visualization of distal esophagus not well assessed.  Grossly normal esophageal distensibility.. 04/2021 EGD: Benign appearing esophageal stenosis with stricture at UES.  Initially 6 difficult to pass the scope but was eventually passed and underwent dilation.  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  6 cm hiatal hernia without  paraesophageal component.  GE flap valve Hill grade 4.  Patient's been taking MiraLAX 2 or 3 times a week with improvement in constipation.  Has brown stools usually on a daily basis.  He takes a couple of Excedrin daily in the morning.  Around 6 PM yesterday had a bowel movement and wiped and saw blood.  No blood in the toilet water.  5 further episodes were of large volume hematochezia, the commode water was completely burgundy colored with no visible clear water.  There was no abdominal pain, nausea, vomiting.  Had an episode of dizziness.  Since arriving at the ED around 6 AM today.  Has only had 1 smaller volume episode of burgundy stool.  Hgb 10.4, 14 months ago it was 15.3.  MCV 93.  Platelets 146.  Normal WBCs.  Na 134, potassium 3.3.  BUNs/creatinine normal.  Glucose 114.  LFTs normal.  Lactic acid normal.  INR normal  CTAP, angio ordered, about to transport for this.  Staff has given him a liter of lactated Ringer's and a dose of Protonix 40 mg IV.  Lives with his wife in [place].  Retired Software engineer.  Drinks 3, 12 ounce, beers nightly.  Never smoker.   Past Medical History:  Diagnosis Date   Abnormal EKG 12/09/2018   Arthritis    psoriatic   Esophageal stricture    Essential hypertension 12/09/2018   GERD (  gastroesophageal reflux disease)    History of thyroid cancer    thyroid   Hyperlipidemia    Hypertension    Hypothyroidism    90% thyroidectomy 1973   Mitral regurgitation 11/18/2019   Mitral valve prolapse    Mixed dyslipidemia 12/09/2018   Psoriatic arthritis ([place])    Raynaud's syndrome    Thyroid cancer [facility])     Past Surgical History:  Procedure Laterality Date   BALLOON DILATION N/A 04/12/2021   Procedure: BALLOON DILATION;  Surgeon: Gatha Mayer, MD;  Location: [facility];  Service: Endoscopy;  Laterality: N/A;   COLONOSCOPY     ESOPHAGOGASTRODUODENOSCOPY (EGD) WITH PROPOFOL N/A 04/12/2021   Procedure: ESOPHAGOGASTRODUODENOSCOPY (EGD) WITH PROPOFOL;   Surgeon: Gatha Mayer, MD;  Location: [place];  Service: Endoscopy;  Laterality: N/A;  needs fluoro   INGUINAL HERNIA REPAIR Left 01/28/2019   Procedure: LEFT INGUINAL HERNIA REPAIR WITH MESH;  Surgeon: Rolm Bookbinder, MD;  Location: [place];  Service: General;  Laterality: Left;  GENERAL ANESTHESIA AND TAP BLOCK   KNEE ARTHROSCOPY Left    RIGHT/LEFT HEART CATH AND CORONARY ANGIOGRAPHY N/A 02/03/2021   Procedure: RIGHT/LEFT HEART CATH AND CORONARY ANGIOGRAPHY;  Surgeon: Sherren Mocha, MD;  Location: [place] [facility];  Service: Cardiovascular;  Laterality: N/A;   TEE WITHOUT CARDIOVERSION N/A 04/12/2021   Procedure: TRANSESOPHAGEAL ECHOCARDIOGRAM (TEE);  Surgeon: Werner Lean, MD;  Location: [facility] ENDOSCOPY;  Service: Cardiovascular;  Laterality: N/A;   THYROIDECTOMY, PARTIAL     UMBILICAL HERNIA REPAIR     VARICOSE VEIN SURGERY Right     Prior to Admission medications   Medication Sig Start Date End Date Taking? Authorizing Provider  acetaminophen (TYLENOL) 500 MG tablet Take 500 mg by mouth every 6 (six) hours as needed for moderate pain. Patient not taking: Reported on 05/14/2022    [provider]  amLODipine (NORVASC) 5 MG tablet Take 5 mg by mouth daily. 07/21/20   [provider]  amoxicillin (AMOXIL) 500 MG capsule amoxicillin 500 mg capsule  TAKE 1 CAPSULE BY MOUTH THREE TIMES DAILY WITH FOOD    [provider]  aspirin-acetaminophen-caffeine (EXCEDRIN MIGRAINE) [id number] MG tablet Take 1 tablet by mouth every 6 (six) hours as needed for headache.    [provider]  b complex vitamins tablet Take 1 tablet by mouth daily.    [provider]  Biotin 5 MG CAPS Take 5 mg by mouth daily. Patient not taking: Reported on 05/14/2022    [provider]  clindamycin (CLEOCIN T) 1 % lotion Apply 1 application topically daily as needed (irritation). 07/31/19   [provider]  clobetasol ointment (TEMOVATE) 1.60 % Apply  1 application topically 2 (two) times daily as needed (irritation). 09/15/19   [provider]  COVID-19 mRNA bivalent vaccine, Pfizer, (PFIZER COVID-19 VAC BIVALENT) injection Inject into the muscle. 10/17/21   Carlyle Basques, MD  COVID-19 mRNA Vac-TriS, Pfizer, (PFIZER-BIONT COVID-19 VAC-TRIS) SUSP injection Inject into the muscle. 05/01/21   Carlyle Basques, MD  fexofenadine (ALLEGRA) 180 MG tablet Take 180 mg by mouth daily as needed for allergies. During the Spring    [provider]  Ixekizumab (TALTZ) 80 MG/ML SOAJ Inject into the skin every 30 (thirty) days.    [provider]  ketoconazole (NIZORAL) 2 % shampoo Apply 1 application topically every 30 (thirty) days. 01/08/19   [provider]  levothyroxine (SYNTHROID) 50 MCG tablet Take 50 mcg by mouth every other day.    [provider]  levothyroxine (SYNTHROID) 75 MCG tablet Take 75 mcg by mouth every other day.    [provider]  methocarbamol (ROBAXIN) 500 MG tablet Take 500 mg by mouth every 12 (twelve) hours as needed for muscle pain. for pain 08/20/18   [provider]  omeprazole (PRILOSEC) 20 MG capsule Take 20 mg by mouth daily. 06/22/20   [provider]  pseudoephedrine (SUDAFED) 30 MG tablet Take 30 mg by mouth daily as needed for congestion.    [provider]  rosuvastatin (CRESTOR) 10 MG tablet Take 10 mg by mouth daily.    [provider]  triamcinolone cream (KENALOG) 0.1 % Apply 1 application. topically as needed. 11/26/21   [provider]    Scheduled Meds:  Infusions:  PRN Meds:    Allergies as of 11/06/2022   (No Known Allergies)    Family History  Problem Relation Age of Onset   Hypertension Mother    Arthritis Mother    Heart Problems Maternal Grandmother    Stroke Father    Crohn's disease Sister    Skin cancer Sister    Leukemia Brother    Heart attack Maternal Grandfather     Social History    Socioeconomic History   Marital status: Married    Spouse name: Not on file   Number of children: Not on file   Years of education: Not on file   Highest education level: Not on file  Occupational History   Occupation: retired    Comment: Pharmacist  Tobacco Use   Smoking status: Never   Smokeless tobacco: Never  Vaping Use   Vaping Use: Never used  Substance and Sexual Activity   Alcohol use: Yes    Alcohol/week: 14.0 - 21.0 standard drinks of alcohol    Types: 14 - 21 Cans of beer per week   Drug use: Never   Sexual activity: Not on file  Other Topics Concern   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  Normally does not have issues with weakness or fatigue. ENT:  No nose bleeds Pulm: No shortness of breath or cough CV:  No palpitations, no LE edema.  No angina. GU:  No hematuria, no frequency GI: See HPI. Heme: No unusual bleeding or bruising. Transfusions: None. Neuro: Dizziness last night, has not recurred.  No headaches, no peripheral tingling or numbness Derm:  No itching, no rash or sores.  Endocrine:  No sweats or chills.  No polyuria or dysuria Immunization: Reviewed. Travel:  Not queried.   PHYSICAL EXAM: Vital signs in last 24 hours: Vitals:   11/06/22 1016 11/06/22 1030  BP:  131/82  Pulse:  78  Resp:  11  Temp: 98.5 F (36.9 C)    SpO2:  98%   Wt Readings from Last 3  Encounters:  11/06/22 72.6 kg  05/14/22 74.1 kg  10/25/21 72.7 kg    General: Patient looks well.  Comfortable, provides excellent history. Head: No facial asymmetry or swelling.  No signs of head trauma. Eyes: Conjunctiva pink. Ears: Not hard of hearing Nose: No congestion or discharge Mouth: Good dentition.  Tongue midline.  Mucosa moist, pink, clear. Neck: No JVD, no masses, no thyromegaly.  Well-healed surgical scar at base of the neck. Lungs: Clear bilaterally without labored breathing or cough Heart:  RRR.  No MRG.  S1, S2 present. Abdomen: Soft without tenderness.  No HSM, masses, bruits, hernias..   Rectal: Did not repeat this.  EDMD Dr. Roxanne Mins performed DRE and noted an enlarged, hard prostate and a moderate amount of bright red blood in rectal vault. Musc/Skeltl: Some deformity, swelling and redness of the fingers which are warm to the touch. Extremities: No CCE. Neurologic: Oriented x 3.  Moves all 4 limbs without tremor or gross weakness but limb strength not tested.  No tremor. Skin: No rash or sores. Nodes: No cervical adenopathy Psych: Calm, pleasant, cooperative.  Intake/Output from previous day: No intake/output data recorded. Intake/Output this shift: No intake/output data recorded.  LAB RESULTS: Recent Labs    11/06/22 0649  WBC 4.7  HGB 10.4*  HCT 30.8*  PLT 146*   BMET Lab Results  Component Value Date   NA 134 (L) 11/06/2022   NA 128 (L) 08/14/2021   NA 136 04/06/2021   K 3.3 (L) 11/06/2022   K 3.7 08/14/2021   K 4.9 04/06/2021   CL 96 (L) 11/06/2022   CL 93 (L) 08/14/2021   CL 96 04/06/2021   CO2 26 11/06/2022   CO2 21 (L) 08/14/2021   CO2 28 04/06/2021   GLUCOSE 114 (H) 11/06/2022   GLUCOSE 163 (H) 08/14/2021   GLUCOSE 92 04/06/2021   BUN 18 11/06/2022   BUN 16 08/14/2021   BUN 14 04/06/2021   CREATININE 0.84 11/06/2022   CREATININE 0.72 08/14/2021   CREATININE 0.94 04/06/2021   CALCIUM 8.4  (L) 11/06/2022   CALCIUM 9.3 08/14/2021   CALCIUM 9.6 04/06/2021   LFT Recent Labs    11/06/22 0649  PROT 5.6*  ALBUMIN 3.5  AST 28  ALT 25  ALKPHOS 48  BILITOT 0.4   PT/INR Lab Results  Component Value Date   INR 1.1 11/06/2022   Hepatitis Panel No results for input(s): "HEPBSAG", "HCVAB", "HEPAIGM", "HEPBIGM" in the last 72 hours. C-Diff No components found for: "CDIFF" Lipase  No results found for: "LIPASE"  Drugs of Abuse  No results found for: "LABOPIA", "COCAINSCRNUR", "LABBENZ", "AMPHETMU", "THCU", "LABBARB"   RADIOLOGY STUDIES: No results found.    IMPRESSION:   Acute, painless hematochezia.  Sounds diverticular in nature.  History of emote colonoscopy with unknown findings.  No mention of colon diverticulosis on a CT scan in April 2022.  History radiation stricture of esophagus, dilated in 04/2021.  Status post subtotal thyroidectomy in the 1970s.  He had had radiation to treat tonsils in the 1950s.    PLAN:     Await findings of CT AP with angio.  The bleeding has slowed down so I suspect this may be negative for active bleeding.  However if it is positive, should undergo angiography with attempt at embolization.  Likely colonoscopy, will discuss timing with Dr. Candis Schatz.  No need for ongoing IV Protonix.   Azucena Freed  11/06/2022, 10:35 AM Phone [phone]  ------------------------------------------------------------------------------------------------  I have taken  a history, reviewed the chart and examined the patient. I performed a substantive portion of this encounter, including complete performance of at least one of the key components, in conjunction with the APP. I agree with the APP's note, impression and recommendations  76 year old male with severe mitral regurgitation, recently diagnosed prostate cancer (not yet treated), psoriatic arthritis, history of thyroid cancer, with one day of multiple, painless, profuse bloody bowel  movements with associated with light-headedness and 4 point drop in hgb.   Presentation is very suggestive of diverticular bleed although no diverticula visible on CT and patient denies being told he had diverticulosis on previous colonoscopy >10 years ago.  CT-A negative for active hemorrhage, and clinically bleeding seems to have subsided substantially.  BP normal and not tachycardic.  Hgb stable since arrival.  Will plan for colonoscopy tomorrow to assess for source of bleeding and provide hemostasis if needed CLD today, NPO after midnight except bowel prep. CBC qam  Daimon Kean E. Candis Schatz, MD [facility]

## 2022-11-06 NOTE — ED Triage Notes (Signed)
Pt arrives to ED via [facility] from home where he had a syncopal episode after getting out of bed to use the restroom. Pt endorses blood in his stool since yesterday. Denies nausea and vomiting.

## 2022-11-06 NOTE — Consult Note (Addendum)
[facility] Consult: 10:35 AM 11/06/2022  LOS: 0 days   Benign appearing esophageal stenosis at UES.  Scope difficult to pass.  Underwent dilation to.  6 cm hiatal hernia without paraesophageal component.  GE flap valve Hill grade 4. Referring Provider:  Dr Armandina Gemma  Primary Care Physician:  Renaldo Reel, PA Primary Gastroenterologist:  Dr. Carlean Purl     Reason for Consultation:   Painless hematochezia   HPI: is Cristian Nunez is a 76 y.o. male.  PMH Advanced aortic atherosclerosis without aneurysm on CT in 08/2021.  Hyponatremia with sodium 128 14 months ago.  DM 2.  In his 69s tonsillitis was treated with radiation.  Developed thyroid cancer and underwent thyroidectomy in the 1970s.  Mitral valve prolapse.  Psoriatic arthritis, treated with monthly injections of Taltz.  Raynaud's Prior colonoscopy 10 or more years ago, patient recalls this as unremarkable and did not receive any call back letters.  Prostate biopsy confirming prostate cancer 09/12/2022.  Has upcoming appointment with Dr. Tresa Moore, rad onc, this coming Friday.   GI Evaluation with EGD in April/May 2022 for history of esophageal stricture, paraesophageal hiatal hernia, prior neck radiation, constipation.  Cardiology had not been able to pass the TEE scope. 02/2021 esophagram: Narrowing of esophagus at thoracic inlet sliding and paraesophageal mixed type hiatal hernia not well-evaluated due to patient positioning.  Visualization of distal esophagus not well assessed.  Grossly normal esophageal distensibility.. 04/2021 EGD: Benign appearing esophageal stenosis with stricture at UES.  Initially 6 difficult to pass the scope but was eventually passed and underwent dilation.  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  6 cm hiatal hernia without  paraesophageal component.  GE flap valve Hill grade 4.  Patient's been taking MiraLAX 2 or 3 times a week with improvement in constipation.  Has brown stools usually on a daily basis.  He takes a couple of Excedrin daily in the morning.  Around 6 PM yesterday had a bowel movement and wiped and saw blood.  No blood in the toilet water.  5 further episodes were of large volume hematochezia, the commode water was completely burgundy colored with no visible clear water.  There was no abdominal pain, nausea, vomiting.  Had an episode of dizziness.  Since arriving at the ED around 6 AM today.  Has only had 1 smaller volume episode of burgundy stool.  Hgb 10.4, 14 months ago it was 15.3.  MCV 93.  Platelets 146.  Normal WBCs.  Na 134, potassium 3.3.  BUNs/creatinine normal.  Glucose 114.  LFTs normal.  Lactic acid normal.  INR normal  CTAP, angio ordered, about to transport for this.  Staff has given him a liter of lactated Ringer's and a dose of Protonix 40 mg IV.  Lives with his wife in [place].  Retired Software engineer.  Drinks 3, 12 ounce, beers nightly.  Never smoker.   Past Medical History:  Diagnosis Date   Abnormal EKG 12/09/2018   Arthritis    psoriatic   Esophageal stricture    Essential hypertension 12/09/2018   GERD (  gastroesophageal reflux disease)    History of thyroid cancer    thyroid   Hyperlipidemia    Hypertension    Hypothyroidism    90% thyroidectomy 1973   Mitral regurgitation 11/18/2019   Mitral valve prolapse    Mixed dyslipidemia 12/09/2018   Psoriatic arthritis ([facility])    Raynaud's syndrome    Thyroid cancer [facility])     Past Surgical History:  Procedure Laterality Date   BALLOON DILATION N/A 04/12/2021   Procedure: BALLOON DILATION;  Surgeon: Gatha Mayer, MD;  Location: [place];  Service: Endoscopy;  Laterality: N/A;   COLONOSCOPY     ESOPHAGOGASTRODUODENOSCOPY (EGD) WITH PROPOFOL N/A 04/12/2021   Procedure: ESOPHAGOGASTRODUODENOSCOPY (EGD) WITH PROPOFOL;   Surgeon: Gatha Mayer, MD;  Location: [place];  Service: Endoscopy;  Laterality: N/A;  needs fluoro   INGUINAL HERNIA REPAIR Left 01/28/2019   Procedure: LEFT INGUINAL HERNIA REPAIR WITH MESH;  Surgeon: Rolm Bookbinder, MD;  Location: [place];  Service: General;  Laterality: Left;  GENERAL ANESTHESIA AND TAP BLOCK   KNEE ARTHROSCOPY Left    RIGHT/LEFT HEART CATH AND CORONARY ANGIOGRAPHY N/A 02/03/2021   Procedure: RIGHT/LEFT HEART CATH AND CORONARY ANGIOGRAPHY;  Surgeon: Sherren Mocha, MD;  Location: [place] [facility];  Service: Cardiovascular;  Laterality: N/A;   TEE WITHOUT CARDIOVERSION N/A 04/12/2021   Procedure: TRANSESOPHAGEAL ECHOCARDIOGRAM (TEE);  Surgeon: Werner Lean, MD;  Location: [facility] ENDOSCOPY;  Service: Cardiovascular;  Laterality: N/A;   THYROIDECTOMY, PARTIAL     UMBILICAL HERNIA REPAIR     VARICOSE VEIN SURGERY Right     Prior to Admission medications   Medication Sig Start Date End Date Taking? Authorizing Provider  acetaminophen (TYLENOL) 500 MG tablet Take 500 mg by mouth every 6 (six) hours as needed for moderate pain. Patient not taking: Reported on 05/14/2022    [provider]  amLODipine (NORVASC) 5 MG tablet Take 5 mg by mouth daily. 07/21/20   [provider]  amoxicillin (AMOXIL) 500 MG capsule amoxicillin 500 mg capsule  TAKE 1 CAPSULE BY MOUTH THREE TIMES DAILY WITH FOOD    [provider]  aspirin-acetaminophen-caffeine (EXCEDRIN MIGRAINE) [phone] MG tablet Take 1 tablet by mouth every 6 (six) hours as needed for headache.    [provider]  b complex vitamins tablet Take 1 tablet by mouth daily.    [provider]  Biotin 5 MG CAPS Take 5 mg by mouth daily. Patient not taking: Reported on 05/14/2022    [provider]  clindamycin (CLEOCIN T) 1 % lotion Apply 1 application topically daily as needed (irritation). 07/31/19   [provider]  clobetasol ointment (TEMOVATE) 3.66 % Apply  1 application topically 2 (two) times daily as needed (irritation). 09/15/19   [provider]  COVID-19 mRNA bivalent vaccine, Pfizer, (PFIZER COVID-19 VAC BIVALENT) injection Inject into the muscle. 10/17/21   Carlyle Basques, MD  COVID-19 mRNA Vac-TriS, Pfizer, (PFIZER-BIONT COVID-19 VAC-TRIS) SUSP injection Inject into the muscle. 05/01/21   Carlyle Basques, MD  fexofenadine (ALLEGRA) 180 MG tablet Take 180 mg by mouth daily as needed for allergies. During the Spring    [provider]  Ixekizumab (TALTZ) 80 MG/ML SOAJ Inject into the skin every 30 (thirty) days.    [provider]  ketoconazole (NIZORAL) 2 % shampoo Apply 1 application topically every 30 (thirty) days. 01/08/19   [provider]  levothyroxine (SYNTHROID) 50 MCG tablet Take 50 mcg by mouth every other day.    [provider]  levothyroxine (SYNTHROID) 75 MCG tablet Take 75 mcg by mouth every other day.    [provider]  methocarbamol (ROBAXIN) 500 MG tablet Take 500 mg by mouth every 12 (twelve) hours as needed for muscle pain. for pain 08/20/18   [provider]  omeprazole (PRILOSEC) 20 MG capsule Take 20 mg by mouth daily. 06/22/20   [provider]  pseudoephedrine (SUDAFED) 30 MG tablet Take 30 mg by mouth daily as needed for congestion.    [provider]  rosuvastatin (CRESTOR) 10 MG tablet Take 10 mg by mouth daily.    [provider]  triamcinolone cream (KENALOG) 0.1 % Apply 1 application. topically as needed. 11/26/21   [provider]    Scheduled Meds:  Infusions:  PRN Meds:    Allergies as of 11/06/2022   (No Known Allergies)    Family History  Problem Relation Age of Onset   Hypertension Mother    Arthritis Mother    Heart Problems Maternal Grandmother    Stroke Father    Crohn's disease Sister    Skin cancer Sister    Leukemia Brother    Heart attack Maternal Grandfather     Social History    Socioeconomic History   Marital status: Married    Spouse name: Not on file   Number of children: Not on file   Years of education: Not on file   Highest education level: Not on file  Occupational History   Occupation: retired    Comment: Pharmacist  Tobacco Use   Smoking status: Never   Smokeless tobacco: Never  Vaping Use   Vaping Use: Never used  Substance and Sexual Activity   Alcohol use: Yes    Alcohol/week: 14.0 - 21.0 standard drinks of alcohol    Types: 14 - 21 Cans of beer per week   Drug use: Never   Sexual activity: Not on file  Other Topics Concern   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  Normally does not have issues with weakness or fatigue. ENT:  No nose bleeds Pulm: No shortness of breath or cough CV:  No palpitations, no LE edema.  No angina. GU:  No hematuria, no frequency GI: See HPI. Heme: No unusual bleeding or bruising. Transfusions: None. Neuro: Dizziness last night, has not recurred.  No headaches, no peripheral tingling or numbness Derm:  No itching, no rash or sores.  Endocrine:  No sweats or chills.  No polyuria or dysuria Immunization: Reviewed. Travel:  Not queried.   PHYSICAL EXAM: Vital signs in last 24 hours: Vitals:   11/06/22 1016 11/06/22 1030  BP:  131/82  Pulse:  78  Resp:  11  Temp: 98.5 F (36.9 C)    SpO2:  98%   Wt Readings from Last 3  Encounters:  11/06/22 72.6 kg  05/14/22 74.1 kg  10/25/21 72.7 kg    General: Patient looks well.  Comfortable, provides excellent history. Head: No facial asymmetry or swelling.  No signs of head trauma. Eyes: Conjunctiva pink. Ears: Not hard of hearing Nose: No congestion or discharge Mouth: Good dentition.  Tongue midline.  Mucosa moist, pink, clear. Neck: No JVD, no masses, no thyromegaly.  Well-healed surgical scar at base of the neck. Lungs: Clear bilaterally without labored breathing or cough Heart:  RRR.  No MRG.  S1, S2 present. Abdomen: Soft without tenderness.  No HSM, masses, bruits, hernias..   Rectal: Did not repeat this.  EDMD Dr. Roxanne Mins performed DRE and noted an enlarged, hard prostate and a moderate amount of bright red blood in rectal vault. Musc/Skeltl: Some deformity, swelling and redness of the fingers which are warm to the touch. Extremities: No CCE. Neurologic: Oriented x 3.  Moves all 4 limbs without tremor or gross weakness but limb strength not tested.  No tremor. Skin: No rash or sores. Nodes: No cervical adenopathy Psych: Calm, pleasant, cooperative.  Intake/Output from previous day: No intake/output data recorded. Intake/Output this shift: No intake/output data recorded.  LAB RESULTS: Recent Labs    11/06/22 0649  WBC 4.7  HGB 10.4*  HCT 30.8*  PLT 146*   BMET Lab Results  Component Value Date   NA 134 (L) 11/06/2022   NA 128 (L) 08/14/2021   NA 136 04/06/2021   K 3.3 (L) 11/06/2022   K 3.7 08/14/2021   K 4.9 04/06/2021   CL 96 (L) 11/06/2022   CL 93 (L) 08/14/2021   CL 96 04/06/2021   CO2 26 11/06/2022   CO2 21 (L) 08/14/2021   CO2 28 04/06/2021   GLUCOSE 114 (H) 11/06/2022   GLUCOSE 163 (H) 08/14/2021   GLUCOSE 92 04/06/2021   BUN 18 11/06/2022   BUN 16 08/14/2021   BUN 14 04/06/2021   CREATININE 0.84 11/06/2022   CREATININE 0.72 08/14/2021   CREATININE 0.94 04/06/2021   CALCIUM 8.4  (L) 11/06/2022   CALCIUM 9.3 08/14/2021   CALCIUM 9.6 04/06/2021   LFT Recent Labs    11/06/22 0649  PROT 5.6*  ALBUMIN 3.5  AST 28  ALT 25  ALKPHOS 48  BILITOT 0.4   PT/INR Lab Results  Component Value Date   INR 1.1 11/06/2022   Hepatitis Panel No results for input(s): "HEPBSAG", "HCVAB", "HEPAIGM", "HEPBIGM" in the last 72 hours. C-Diff No components found for: "CDIFF" Lipase  No results found for: "LIPASE"  Drugs of Abuse  No results found for: "LABOPIA", "COCAINSCRNUR", "LABBENZ", "AMPHETMU", "THCU", "LABBARB"   RADIOLOGY STUDIES: No results found.    IMPRESSION:   Acute, painless hematochezia.  Sounds diverticular in nature.  History of emote colonoscopy with unknown findings.  No mention of colon diverticulosis on a CT scan in April 2022.  History radiation stricture of esophagus, dilated in 04/2021.  Status post subtotal thyroidectomy in the 1970s.  He had had radiation to treat tonsils in the 1950s.    PLAN:     Await findings of CT AP with angio.  The bleeding has slowed down so I suspect this may be negative for active bleeding.  However if it is positive, should undergo angiography with attempt at embolization.  Likely colonoscopy, will discuss timing with Dr. Candis Schatz.  No need for ongoing IV Protonix.   Azucena Freed  11/06/2022, 10:35 AM Phone [phone]  ------------------------------------------------------------------------------------------------  I have taken  a history, reviewed the chart and examined the patient. I performed a substantive portion of this encounter, including complete performance of at least one of the key components, in conjunction with the APP. I agree with the APP's note, impression and recommendations  76 year old male with severe mitral regurgitation, recently diagnosed prostate cancer (not yet treated), psoriatic arthritis, history of thyroid cancer, with one day of multiple, painless, profuse bloody bowel  movements with associated with light-headedness and 4 point drop in hgb.   Presentation is very suggestive of diverticular bleed although no diverticula visible on CT and patient denies being told he had diverticulosis on previous colonoscopy >10 years ago.  CT-A negative for active hemorrhage, and clinically bleeding seems to have subsided substantially.  BP normal and not tachycardic.  Hgb stable since arrival.  Will plan for colonoscopy tomorrow to assess for source of bleeding and provide hemostasis if needed CLD today, NPO after midnight except bowel prep. CBC qam  Rj Pedrosa E. Candis Schatz, MD [facility]

## 2022-11-06 NOTE — ED Provider Notes (Signed)
[facility] EMERGENCY DEPARTMENT Provider Note   CSN: [id number] Arrival date & time: 11/06/22  1696     History  Chief Complaint  Patient presents with   Loss of Consciousness    Cristian Nunez is a 76 y.o. male.  The history is provided by the patient.  Loss of Consciousness He has history of hypertension, hyperlipidemia, GERD, thyroid cancer, prostate cancer, psoriatic arthritis with Raynaud's phenomenon and comes in following a syncopal episode at home.  He states that for the last 24 hours, he has been passing black stools which she thinks is blood.  He has had 5 or 6 such bowel movements.  Tonight, he was returning to his bed from one of the bowel movements when he got lightheaded.  He thought he would be able to get back to his bed, but had a syncopal episode.  He is not sure how long he was unconscious, but did have urinary incontinence.  He denies any nausea or vomiting denies other episodes of dizziness or lightheadedness or weakness.  He denies abdominal pain or chest pain or dyspnea.  He does admit to taking 2 Excedrin every morning, but denies other NSAID use.  He is not on any anticoagulants or antiplatelet agents.  He has no history of prior GI bleeding.  Home Medications Prior to Admission medications   Medication Sig Start Date End Date Taking? Authorizing Provider  acetaminophen (TYLENOL) 500 MG tablet Take 500 mg by mouth every 6 (six) hours as needed for moderate pain. Patient not taking: Reported on 05/14/2022    [provider]  amLODipine (NORVASC) 5 MG tablet Take 5 mg by mouth daily. 07/21/20   [provider]  amoxicillin (AMOXIL) 500 MG capsule amoxicillin 500 mg capsule  TAKE 1 CAPSULE BY MOUTH THREE TIMES DAILY WITH FOOD    [provider]  aspirin-acetaminophen-caffeine (EXCEDRIN MIGRAINE) [phone] MG tablet Take 1 tablet by mouth every 6 (six) hours as needed for headache.    [provider]  b complex  vitamins tablet Take 1 tablet by mouth daily.    [provider]  Biotin 5 MG CAPS Take 5 mg by mouth daily. Patient not taking: Reported on 05/14/2022    [provider]  clindamycin (CLEOCIN T) 1 % lotion Apply 1 application topically daily as needed (irritation). 07/31/19   [provider]  clobetasol ointment (TEMOVATE) 7.51 % Apply 1 application topically 2 (two) times daily as needed (irritation). 09/15/19   [provider]  COVID-19 mRNA bivalent vaccine, Pfizer, (PFIZER COVID-19 VAC BIVALENT) injection Inject into the muscle. 10/17/21   Carlyle Basques, MD  COVID-19 mRNA Vac-TriS, Pfizer, (PFIZER-BIONT COVID-19 VAC-TRIS) SUSP injection Inject into the muscle. 05/01/21   Carlyle Basques, MD  fexofenadine (ALLEGRA) 180 MG tablet Take 180 mg by mouth daily as needed for allergies. During the Spring    [provider]  Ixekizumab (TALTZ) 80 MG/ML SOAJ Inject into the skin every 30 (thirty) days.    [provider]  ketoconazole (NIZORAL) 2 % shampoo Apply 1 application topically every 30 (thirty) days. 01/08/19   [provider]  levothyroxine (SYNTHROID) 50 MCG tablet Take 50 mcg by mouth every other day.    [provider]  levothyroxine (SYNTHROID) 75 MCG tablet Take 75 mcg by mouth every other day.    [provider]  methocarbamol (ROBAXIN) 500 MG tablet Take 500 mg by mouth every 12 (twelve) hours as needed for muscle pain. for pain 08/20/18  [provider]  omeprazole (PRILOSEC) 20 MG capsule Take 20 mg by mouth daily. 06/22/20   [provider]  pseudoephedrine (SUDAFED) 30 MG tablet Take 30 mg by mouth daily as needed for congestion.    [provider]  rosuvastatin (CRESTOR) 10 MG tablet Take 10 mg by mouth daily.    [provider]  triamcinolone cream (KENALOG) 0.1 % Apply 1 application. topically as needed. 11/26/21   [provider]      Allergies    Patient has  no known allergies.    Review of Systems   Review of Systems  Cardiovascular:  Positive for syncope.  All other systems reviewed and are negative.   Physical Exam Updated Vital Signs BP (!) 140/105 (BP Location: Right Arm)   Pulse 69   Temp 98.7 F (37.1 C) (Oral)   Resp 20   Ht '5\' 8"'$  (1.727 m)   Wt 72.6 kg   SpO2 100%   BMI 24.33 kg/m  Physical Exam Vitals and nursing note reviewed. Exam conducted with a chaperone present.   76 year old male, resting comfortably and in no acute distress. Vital signs are significant for elevated blood pressure. Oxygen saturation is 100%, which is normal. Head is normocephalic and atraumatic. PERRLA, EOMI. Oropharynx is clear. Neck is nontender and supple without adenopathy or JVD. Back is nontender and there is no CVA tenderness. Lungs are clear without rales, wheezes, or rhonchi. Chest is nontender. Heart has regular rate and rhythm without murmur. Abdomen is soft, flat, nontender. Rectal: Normal sphincter tone.  Prostate moderately enlarged and hard.  There is a moderate amount of bright red blood present in the rectal vault. Extremities have no cyanosis or edema, full range of motion is present. Skin is warm and dry without rash. Neurologic: Mental status is normal, cranial nerves are intact, moves all extremities equally.  ED Results / Procedures / Treatments   Labs (all labs ordered are listed, but only abnormal results are displayed) Labs Reviewed  COMPREHENSIVE METABOLIC PANEL  CBC WITH DIFFERENTIAL/PLATELET  LACTIC ACID, PLASMA  PROTIME-INR  POC OCCULT BLOOD, ED  TYPE AND SCREEN    EKG None  Radiology No results found.  Procedures Procedures  Cardiac monitor shows normal sinus rhythm, per my interpretation.  Medications Ordered in ED Medications  lactated ringers bolus 1,000 mL (has no administration in time range)    ED Course/ Medical Decision Making/ A&P                           Medical Decision  Making Amount and/or Complexity of Data Reviewed Labs: ordered.   GI bleeding which seems most likely to be lower GI based on my finding bright red blood in the rectum.  I have reviewed his past records, and he had an upper endoscopy on 04/13/2021 which was significant for an esophageal stricture and hiatus hernia, but no peptic ulcer disease.  I have ordered IV fluids, CBC, comprehensive metabolic panel, INR, lactic acid level, type and screen.  Syncope seems clearly related to his bleeding, I am not initiating a cardiac evaluation other than ordering an electrocardiogram.  He will need admission for monitoring his hemoglobin and probable urgent colonoscopy.  I have reviewed and interpreted the laboratory tests which are back thus far with, and my interpretation is positive stool Hemoccult consistent with gastrointestinal bleeding.  CBC and metabolic panel and INR and lactic acid are still pending.  Case is signed  out to Dr. Armandina Gemma.  CRITICAL CARE Performed by: Delora Fuel Total critical care time: 35 minutes Critical care time was exclusive of separately billable procedures and treating other patients. Critical care was necessary to treat or prevent imminent or life-threatening deterioration. Critical care was time spent personally by me on the following activities: development of treatment plan with patient and/or surrogate as well as nursing, discussions with consultants, evaluation of patient's response to treatment, examination of patient, obtaining history from patient or surrogate, ordering and performing treatments and interventions, ordering and review of laboratory studies, ordering and review of radiographic studies, pulse oximetry and re-evaluation of patient's condition.  Final Clinical Impression(s) / ED Diagnoses Final diagnoses:  Lower gastrointestinal bleeding    Rx / DC Orders ED Discharge Orders     None         Delora Fuel, MD 99/87/21 [phone]

## 2022-11-06 NOTE — ED Provider Notes (Signed)
  Physical Exam  BP 121/72 (BP Location: Right Arm)   Pulse 68   Temp 98.7 F (37.1 C) (Oral)   Resp 13   Ht '5\' 8"'$  (1.727 m)   Wt 72.6 kg   SpO2 100%   BMI 24.33 kg/m     Procedures  Procedures  ED Course / MDM    Medical Decision Making Amount and/or Complexity of Data Reviewed Labs: ordered. Radiology: ordered.  Risk Prescription drug management. Decision regarding hospitalization.   45M presenting with concern for acute GIB. [facility] patient, waiting on labs for admission.   The patient was administered an IV fluid bolus and IV Protonix.  He was fecal occult positive with bright red blood noted on rectal exam by the previous provider.  Laboratory evaluation significant for an acute anemia with a hemoglobin of 10.4, dropped from the last measurement of 15.  INR was normal, CMP without elevated BUN, normal serum creatinine, normal liver function.  Spoke with Azucena Freed PA of on-call gastroenterology for [place] who will see the patient and consultation. Medicine consulted for admission for GI bleed.  CTA GI Bleed: IMPRESSION:  1. No acute abnormality of the abdomen or pelvis.  2. No significant stenosis of the mesenteric arteries or veins.  3. No active extravasation identified within the gastrointestinal  tract.  4. Moderate compression of the left common iliac vein as it passes  posterior to the right common iliac artery. 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  5. Nonspecific thickening of the wall of the stomach within a small  hiatal hernia may be due to gastritis. Please correlate with  endoscopy findings.    Aortic Atherosclerosis (ICD10-I70.0).   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 The patient was subsequently admitted to internal medicine in stable condition.   Regan Lemming, MD 11/06/22 1245

## 2022-11-07 ENCOUNTER — Encounter (HOSPITAL_COMMUNITY): Payer: Self-pay | Admitting: Internal Medicine

## 2022-11-07 ENCOUNTER — Inpatient Hospital Stay (HOSPITAL_COMMUNITY): Payer: Medicare Other | Admitting: Anesthesiology

## 2022-11-07 ENCOUNTER — Encounter (HOSPITAL_COMMUNITY)
Admission: EM | Disposition: A | Discharge status: Home / Self Care | Payer: Self-pay | Source: Home / Self Care | Attending: Internal Medicine

## 2022-11-07 DIAGNOSIS — I10 Essential (primary) hypertension: Secondary | ICD-10-CM

## 2022-11-07 DIAGNOSIS — R55 Syncope and collapse: Secondary | ICD-10-CM | POA: Diagnosis not present

## 2022-11-07 DIAGNOSIS — I341 Nonrheumatic mitral (valve) prolapse: Secondary | ICD-10-CM

## 2022-11-07 DIAGNOSIS — K5731 Diverticulosis of large intestine without perforation or abscess with bleeding: Principal | ICD-10-CM

## 2022-11-07 DIAGNOSIS — K922 Gastrointestinal hemorrhage, unspecified: Secondary | ICD-10-CM | POA: Diagnosis not present

## 2022-11-07 DIAGNOSIS — K5791 Diverticulosis of intestine, part unspecified, without perforation or abscess with bleeding: Secondary | ICD-10-CM | POA: Diagnosis not present

## 2022-11-07 DIAGNOSIS — I340 Nonrheumatic mitral (valve) insufficiency: Secondary | ICD-10-CM

## 2022-11-07 DIAGNOSIS — N429 Disorder of prostate, unspecified: Secondary | ICD-10-CM

## 2022-11-07 HISTORY — PX: COLONOSCOPY WITH PROPOFOL: SHX5780

## 2022-11-07 LAB — SURGICAL PCR SCREEN
MRSA, PCR: NEGATIVE
Staphylococcus aureus: NEGATIVE

## 2022-11-07 LAB — CBC
HCT: 24.5 % — ABNORMAL LOW (ref 39.0–52.0)
HCT: 21.9 % — ABNORMAL LOW (ref 39.0–52.0)
Hemoglobin: 8.4 g/dL — ABNORMAL LOW (ref 13.0–17.0)
Hemoglobin: 7.6 g/dL — ABNORMAL LOW (ref 13.0–17.0)
MCH: 31.7 pg (ref 26.0–34.0)
MCH: 31.8 pg (ref 26.0–34.0)
MCHC: 34.3 g/dL (ref 30.0–36.0)
MCHC: 34.7 g/dL (ref 30.0–36.0)
MCV: 92.5 fL (ref 80.0–100.0)
MCV: 91.6 fL (ref 80.0–100.0)
Platelets: 138 10*3/uL — ABNORMAL LOW (ref 150–400)
Platelets: 115 10*3/uL — ABNORMAL LOW (ref 150–400)
RBC: 2.65 MIL/uL — ABNORMAL LOW (ref 4.22–5.81)
RBC: 2.39 MIL/uL — ABNORMAL LOW (ref 4.22–5.81)
RDW: 13.1 % (ref 11.5–15.5)
RDW: 13 % (ref 11.5–15.5)
WBC: 5.1 10*3/uL (ref 4.0–10.5)
WBC: 4.2 10*3/uL (ref 4.0–10.5)
nRBC: 0 % (ref 0.0–0.2)
nRBC: 0 % (ref 0.0–0.2)

## 2022-11-07 LAB — BASIC METABOLIC PANEL
Anion gap: 8 (ref 5–15)
BUN: 16 mg/dL (ref 8–23)
CO2: 21 mmol/L — ABNORMAL LOW (ref 22–32)
Calcium: 7.8 mg/dL — ABNORMAL LOW (ref 8.9–10.3)
Chloride: 107 mmol/L (ref 98–111)
Creatinine, Ser: 0.8 mg/dL (ref 0.61–1.24)
GFR, Estimated: >60 mL/min (ref >60)
Glucose, Bld: 144 mg/dL — ABNORMAL HIGH (ref 70–99)
Potassium: 3.1 mmol/L — ABNORMAL LOW (ref 3.5–5.1)
Sodium: 136 mmol/L (ref 135–145)

## 2022-11-07 LAB — HEMOGLOBIN AND HEMATOCRIT, BLOOD
HCT: 23.9 % — ABNORMAL LOW (ref 39.0–52.0)
Hemoglobin: 8.1 g/dL — ABNORMAL LOW (ref 13.0–17.0)

## 2022-11-07 LAB — TSH: TSH: 1.355 u[IU]/mL (ref 0.350–4.500)

## 2022-11-07 LAB — MAGNESIUM: Magnesium: 1.8 mg/dL (ref 1.7–2.4)

## 2022-11-07 SURGERY — COLONOSCOPY WITH PROPOFOL
Anesthesia: Monitor Anesthesia Care

## 2022-11-07 MED ORDER — PHENYLEPHRINE 80 MCG/ML (10ML) SYRINGE FOR IV PUSH (FOR BLOOD PRESSURE SUPPORT)
PREFILLED_SYRINGE | INTRAVENOUS | Status: DC | PRN
  Administered 2022-11-07 (×3): 160 ug via INTRAVENOUS
  Administered 2022-11-07: 80 ug via INTRAVENOUS
  Administered 2022-11-07 (×3): 160 ug via INTRAVENOUS
  Administered 2022-11-07: 240 ug via INTRAVENOUS
  Administered 2022-11-07: 80 ug via INTRAVENOUS
  Administered 2022-11-07: 160 ug via INTRAVENOUS
  Administered 2022-11-07: 80 ug via INTRAVENOUS
  Administered 2022-11-07: 160 ug via INTRAVENOUS

## 2022-11-07 MED ORDER — EPHEDRINE SULFATE-NACL 50-0.9 MG/10ML-% IV SOSY
PREFILLED_SYRINGE | INTRAVENOUS | Status: DC | PRN
  Administered 2022-11-07 (×3): 5 mg via INTRAVENOUS

## 2022-11-07 MED ORDER — POTASSIUM CHLORIDE 10 MEQ/100ML IV SOLN
10.0000 meq | INTRAVENOUS | Status: AC
  Administered 2022-11-07: 10 meq via INTRAVENOUS
  Filled 2022-11-07: qty 100

## 2022-11-07 MED ORDER — LIDOCAINE 2% (20 MG/ML) 5 ML SYRINGE
INTRAMUSCULAR | Status: DC | PRN
  Administered 2022-11-07: 40 mg via INTRAVENOUS

## 2022-11-07 MED ORDER — PROPOFOL 500 MG/50ML IV EMUL
INTRAVENOUS | Status: DC | PRN
  Administered 2022-11-07: 10 mg via INTRAVENOUS
  Administered 2022-11-07: 150 ug/kg/min via INTRAVENOUS

## 2022-11-07 MED ORDER — SODIUM CHLORIDE 0.9 % IV SOLN: INTRAVENOUS | Status: DC | PRN

## 2022-11-07 SURGICAL SUPPLY — 22 items

## 2022-11-07 NOTE — Hospital Course (Addendum)
Cristian Nunez is a 76 y.o. with a pertinent PMH of HTN, HLD, GERD, thyroid cancer status post thyroidectomy, recently diagnosed prostate adenocarcinoma, psoriatic arthritis with Raynaud's who presents with syncopal episode and bloody stools and admitted for painless hematochezia likely secondary to diverticulosis found on colonoscopy.    #Vasovagal Syncope #Painless hematochezia likely secondary to diverticulosis Patient presents to ED for syncopal episode after noticing multiple bloody stools. Endorsed prodromal symptoms and wife notes history of syncope after seeing blood. Initial labs significant for positive hemoccult, Hgb 10.4 from 15.3 a year ago, K 3.3, PT/INR 14.2/1.1. Vitals were hemodynamically stable. GI consulted and performed colonoscopy which showed diverticulosis and old blood throughout but no active bleeding present. Hgb dropped to 6.9 and he received 1 unit PRBC. Repeat H&H improved to 9.1. No further bloody stools. Remains asymptomatic and orthostatic vitals normal. He is stable for discharge with daily bowel regimen of miralax and metamucil. He is established with GI and will continue follow-up.   #Prostate adenocarcinoma Patient recently diagnosed with prostate cancer with PET scan showing prostate adenocarcinoma of posterior right aspect of prostate but no metastatic adenopathy, visceral or skeletal metastasis. Has radiation oncology appointment with Dr. Tammi Klippel on Friday. Patient will continue to outpatient follow up.    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 No history of DVT. Exam did not show LLE edema or erythema. VVS evaluated and recommended observation only. Patient can follow VVS in outpatient setting.    #HLD #HTN Normotensive initially during hospital course and held home amlodipine. BP slightly elevated on day of discharge. Discussed with patient  he may restart amlodipine '5mg'$  and he has BP cuff at home to check. Advised patient to hold amlodipine if he becomes symptomatic. We continued his home Crestor 10 mg daily.   #Thyroid cancer s/p thyroidectomy  #Hypothyroidism History of thyroid cancer status post 90% thyroidectomy 1973. Alternates 50 mcg and 75 mcg Synthroid daily. TSH was normal and continued his home Synthroid regimen.     #Chronic back pain #Hx L2-L3, L4-L5 vertebral body fusion Patient reports chronic back pain requiring daily Excedrin and PRN robaxin. Advised patient to avoid Excedrin and NSAIDs.    #GERD PTA on omeprazole 20 mg which was continued during hospital course.    #Psoriatic arthritis  #Raynaud's syndrome  No acute flare ups. Exam showed skin color changes consistent with Raynaud's.    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 Underwent EGD with dilation and May 2022. No problems with swallowing.

## 2022-11-07 NOTE — Transfer of Care (Signed)
Immediate Anesthesia Transfer of Care Note  Patient: Cristian Nunez  Procedure(s) Performed: COLONOSCOPY WITH PROPOFOL  Patient Location: PACU  Anesthesia Type:MAC  Level of Consciousness: sedated  Airway & Oxygen Therapy: Patient Spontanous Breathing and Patient connected to face mask oxygen  Post-op Assessment: Report given to RN and Post -op Vital signs reviewed and stable  Post vital signs: Reviewed and stable  Last Vitals:  Vitals Value Taken Time  BP 102/57 11/07/22 0852  Temp    Pulse 71 11/07/22 0854  Resp 12 11/07/22 0854  SpO2 100 % 11/07/22 0854  Vitals shown include unvalidated device data.  Last Pain:  Vitals:   11/07/22 0656  TempSrc: Temporal  PainSc: 0-No pain      Patients Stated Pain Goal: 3 (46/27/03 5009)  Complications: No notable events documented.

## 2022-11-07 NOTE — Progress Notes (Addendum)
HD#1 Subjective:   Summary: Cristian Nunez is a 76 y.o. male  with PMH of HTN, HLD, GERD, thyroid cancer status post thyroidectomy, recently diagnosed prostate adenocarcinoma, psoriatic arthritis with Raynaud's who presents with syncopal episode and bloody stools and admitted for GI bleed.  Overnight Events: None  Patient reports feeling better this morning. States he was having bloody stools last night when taking the colon prep. Has not had bowel movement after colonoscopy. Denies any lightheadedness, dizziness or presyncopal symptoms. Wife reports other syncopal episodes when patient saw blood. History of constipation and takes miralax periodically. He reports bowel movements about every other day but does require straining. He also reported initially seeing dark tarry stools before the bright red bloody stools. Not on iron supplement or recently taking pepto.     Objective:  Vital signs in last 24 hours: Vitals:   11/07/22 0930 11/07/22 0933 11/07/22 1004 11/07/22 1531  BP: 111/60  (!) 126/53 122/75  Pulse: 76 71 80 86  Resp: '15 15 16 15  '$ Temp:  97.9 F (36.6 C) 98 F (36.7 C) 98.2 F (36.8 C)  TempSrc:   Oral Oral  SpO2: 98% 98% 100% 100%  Weight:      Height:       Supplemental O2: Room Air SpO2: 100 % O2 Flow Rate (L/min): 7 L/min   Physical Exam:  Constitutional: well-appearing male, sitting in bed, in no acute distress HENT: normocephalic atraumatic Neck: supple Cardiovascular: regular rate and rhythm, systolic murmur heard best over apex Pulmonary/Chest: normal work of breathing on room air MSK: normal bulk and tone Neurological: alert & oriented x 3 Skin: warm and dry, fingertips initially white but returned to pink color, normal capillary refill  Psych: normal mood and behavior  Filed Weights   11/06/22 0615 11/06/22 2036  Weight: 72.6 kg 73.7 kg     Intake/Output Summary (Last 24 hours) at 11/07/2022 1542 Last data filed at 11/07/2022 0849 Gross  per 24 hour  Intake 1000 ml  Output --  Net 1000 ml   Net IO Since Admission: 1,000 mL [11/07/22 1542]  Pertinent Labs:    Latest Ref Rng & Units 11/07/2022   10:51 AM 11/07/2022    4:21 AM 11/06/2022   10:35 AM  CBC  WBC 4.0 - 10.5 K/uL  5.1  4.1   Hemoglobin 13.0 - 17.0 g/dL 8.1  8.4  10.1   Hematocrit 39.0 - 52.0 % 23.9  24.5  29.0   Platelets 150 - 400 K/uL  138  140        Latest Ref Rng & Units 11/07/2022    4:21 AM 11/06/2022   10:35 AM 11/06/2022    6:49 AM  CMP  Glucose 70 - 99 mg/dL 144  116  114   BUN 8 - 23 mg/dL '16  15  18   '$ Creatinine 0.61 - 1.24 mg/dL 0.80  0.71  0.84   Sodium 135 - 145 mmol/L 136  137  134   Potassium 3.5 - 5.1 mmol/L 3.1  3.6  3.3   Chloride 98 - 111 mmol/L 107  104  96   CO2 22 - 32 mmol/L '21  24  26   '$ Calcium 8.9 - 10.3 mg/dL 7.8  8.4  8.4   Total Protein 6.5 - 8.1 g/dL  5.1  5.6   Total Bilirubin 0.3 - 1.2 mg/dL  0.6  0.4   Alkaline Phos 38 - 126 U/L  45  48  AST 15 - 41 U/L  26  28   ALT 0 - 44 U/L  24  25     Imaging: No results found.  Assessment/Plan:   Principal Problem:   GI bleed Active Problems:   Diverticulosis of colon with hemorrhage   Patient Summary: Cristian Nunez is a 76 y.o. with a pertinent PMH of HTN, HLD, GERD, thyroid cancer status post thyroidectomy, recently diagnosed prostate adenocarcinoma, psoriatic arthritis with Raynaud's who presents with syncopal episode and bloody stools and admitted for GI bleed secondary to diverticulosis found on colonoscopy.   #Situational Syncope #GI bleed secondary to diverticulosis Colonoscopy done today by Dr. Candis Schatz, found diverticulosis and old blood throughout but no active bleeding. Will continue to monitor for further bleeding. Hgb dropped to 8.4 from 10.1 this morning. Post colonoscopy, Hgb was 8.1. Trend Hgb and if further drops may need transfusion. Per GI, if significant rebleed, consider CT-A if ok per radiology (not sure what guidelines are on repeat  contrast load). Discussed with patient about diet and his home bowel regimen. Syncopal episode sounds like previous episodes due to seeing blood rather than due to acute blood loss. No symptoms when sitting up or standing.  -appreciate GI assistance in care -patient will monitor BM for further bleeding -f/u on CBC @ 1500 -if Hgb <7, consider transfusion  #Prostate adenocarcinoma Patient recently diagnosed with prostate cancer with PET scan showing prostate adenocarcinoma of posterior right aspect of prostate but no metastatic adenopathy, visceral or skeletal metastasis. Has radiation oncology appointment with Dr. Tammi Klippel on Friday.  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 No LLE edema or erythema. Per ED notes, VVS was consulted by them already.  -f/u on VVS consult  #HLD #HTN Normotensive. Will hold home amlodipine. PTA on Crestor.  -continue Crestor 10 mg daily  #Thyroid cancer s/p thyroidectomy  #Hypothyroidism History of thyroid cancer status post 90% thyroidectomy 1973. Alternates 50 mcg and 75 mcg Synthroid daily. TSH normal today.  -Continue alternating Synthroid 50 mcg and 75 mcg    #Chronic back pain #Hx L2-L3, L4-L5 vertebral body fusion Patient reports chronic back pain requiring daily Excedrin and PRN robaxin. Discussed stopping Excedrin given recent GI bleed.  -PRN Tylenol and Robaxin   #GERD PTA omeprazole 20 mg.  - Protonix 40 mg daily  #Psoriatic arthritis  #Raynaud's syndrome  No acute flare ups. Exam today showed skin color changes consistent with Raynaud's.   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 Underwent EGD with dilation and May 2022. No problems with swallowing.   Diet:  Soft  IVF: None,None VTE: None Code: Full Family Update: wife at bedside  today  Dispo: Anticipated discharge to Home in 1-2 days pending no further GI bleeding.   Angelique Blonder, DO Internal Medicine Resident PGY-1 Pager: [phone] Please contact the on call pager after 5 pm and on weekends at [phone].

## 2022-11-07 NOTE — Op Note (Signed)
[facility] Patient Name: Cristian Nunez Procedure Date : 11/07/2022 MRN: [id number] Attending MD: Gladstone Pih. Candis Schatz , MD, [id number] Date of Birth: 03/18/1946 CSN: [id number] Age: 76 Admit Type: Outpatient Procedure:                Colonoscopy Indications:              Hematochezia x 2 days, neg CT-A, Hgb drop from 14                            to 8 Providers:                Delisha Peaden E. Candis Schatz, MD, Benay Pillow, RN, Luan Moore, Technician, Trixie Deis, CRNA Referring MD:              Medicines:                Monitored Anesthesia Care Complications:            No immediate complications. Estimated Blood Loss:     Estimated blood loss: none. Procedure:                Pre-Anesthesia Assessment:                           - Prior to the procedure, a History and Physical                            was performed, and patient medications and                            allergies were reviewed. The patient's tolerance of                            previous anesthesia was also reviewed. The risks                            and benefits of the procedure and the sedation                            options and risks were discussed with the patient.                            All questions were answered, and informed consent                            was obtained. Prior Anticoagulants: The patient has                            taken no anticoagulant or antiplatelet agents. ASA                            Grade Assessment: II - A patient with mild systemic  disease. After reviewing the risks and benefits,                            the patient was deemed in satisfactory condition to                            undergo the procedure.                           After obtaining informed consent, the colonoscope                            was passed under direct vision. Throughout the                            procedure, the  patient's blood pressure, pulse, and                            oxygen saturations were monitored continuously. The                            PCF-190TL ([id number]) Olympus colonoscope was                            introduced through the anus and advanced to the the                            terminal ileum, with identification of the                            appendiceal orifice and IC valve. The colonoscopy                            was technically difficult and complex due to a                            redundant colon, significant looping and a tortuous                            colon. Successful completion of the procedure was                            aided by using manual pressure, withdrawing and                            reinserting the scope and straightening and                            shortening the scope to obtain bowel loop                            reduction. The patient tolerated the procedure  well. The quality of the bowel preparation was                            adequate. The terminal ileum, ileocecal valve,                            appendiceal orifice, and rectum were photographed. Scope In: 7:42:26 AM Scope Out: 8:42:44 AM Scope Withdrawal Time: 0 hours 31 minutes 23 seconds  Total Procedure Duration: 1 hour 0 minutes 18 seconds  Findings:      The perianal examination was normal.      The digital rectal exam findings include increased firmness of the       prostate. Pertinent negatives include normal sphincter tone.      Copious old blood (dark, maroon, clots) was found in the entire colon.      Many large-mouthed, medium-mouthed and small-mouthed diverticula were       found in the sigmoid colon, transverse colon, ascending colon and cecum.       The diverticulosis was most concentrated in the ascending colon/hepatic       flexure and proximal transverse colon. Only one diverticulum was seen       distal to the splenic  flexure. Each diverticulum was inspected and       lavaged.      The exam was otherwise normal throughout the examined colon.      The terminal ileum appeared normal.      The retroflexed view of the distal rectum and anal verge was normal and       showed no anal or rectal abnormalities. Impression:               - Increased firmness of the prostate found on                            digital rectal exam.                           - Copious old blood in the entire examined colon.                            This was adequately cleansed with lavage and                            suction. Good visualization was eventually achieved.                           - Moderate diverticulosis in the sigmoid colon, in                            the transverse colon, in the ascending colon and in                            the cecum, most concentrated in the ascending                            colon. Despite a meticulous examination and  repeated examinations of the right colon, no active                            bleeding was present, and no diverticula with                            bleeding stigmata (adherent clot, visible vessel)                            were seen. Therefore, no hemostasis was provided.                            The patient's GI bleeding can be attributed to                            diverticular hemorrhage, but it appears to have                            stopped as of now.                           - The examined portion of the ileum was normal.                           - The distal rectum and anal verge are normal on                            retroflexion view.                           - No specimens collected. Moderate Sedation:      N/A Recommendation:           - Return patient to hospital ward for ongoing care.                           - Resume soft diet.                           - Continue present medications.                            - Check CBC later today and transfuse for Hgb<7                           - If patient demonstrates repeat brisk hemorrhage,                            would consider repeat CT-A Procedure Code(s):        --- Professional ---                           [phone], Colonoscopy, flexible; diagnostic, including                            collection of specimen(s)  by brushing or washing,                            when performed (separate procedure) Diagnosis Code(s):        --- Professional ---                           N42.9, Disorder of prostate, unspecified                           K92.2, Gastrointestinal hemorrhage, unspecified                           K57.31, Diverticulosis of large intestine without                            perforation or abscess with bleeding                           K92.1, Melena (includes Hematochezia) CPT copyright 2022 [employer]. All rights reserved. The codes documented in this report are preliminary and upon coder review may  be revised to meet current compliance requirements. Bryelle Spiewak E. Candis Schatz, MD 11/07/2022 9:03:32 AM This report has been signed electronically. Number of Addenda: 0

## 2022-11-07 NOTE — Anesthesia Postprocedure Evaluation (Signed)
Anesthesia Post Note  Patient: Cristian Nunez  Procedure(s) Performed: COLONOSCOPY WITH PROPOFOL     Patient location during evaluation: PACU Anesthesia Type: MAC Level of consciousness: awake Pain management: pain level controlled Vital Signs Assessment: post-procedure vital signs reviewed and stable Respiratory status: spontaneous breathing, nonlabored ventilation and respiratory function stable Cardiovascular status: stable and blood pressure returned to baseline Postop Assessment: no apparent nausea or vomiting Anesthetic complications: no   No notable events documented.  Last Vitals:  Vitals:   11/07/22 0933 11/07/22 1004  BP:  (!) 126/53  Pulse: 71 80  Resp: 15 16  Temp: 36.6 C 36.7 C  SpO2: 98% 100%    Last Pain:  Vitals:   11/07/22 1004  TempSrc: Oral  PainSc:                  Nilda Simmer

## 2022-11-07 NOTE — Anesthesia Procedure Notes (Signed)
Procedure Name: MAC Date/Time: 11/07/2022 7:30 AM  Performed by: Leonor Liv, CRNAPre-anesthesia Checklist: Emergency Drugs available, Patient identified, Suction available, Patient being monitored and Timeout performed Patient Re-evaluated:Patient Re-evaluated prior to induction Oxygen Delivery Method: Simple face mask Placement Confirmation: positive ETCO2 Dental Injury: Teeth and Oropharynx as per pre-operative assessment

## 2022-11-07 NOTE — Interval H&P Note (Signed)
History and Physical Interval Note:  11/07/2022 7:15 AM  Cristian Nunez  has presented today for surgery, with the diagnosis of Painless hematochezia.  Anemia..  The various methods of treatment have been discussed with the patient and family. After consideration of risks, benefits and other options for treatment, the patient has consented to  Procedure(s): COLONOSCOPY WITH PROPOFOL (N/A) as a surgical intervention.  The patient's history has been reviewed, patient examined, no change in status, stable for surgery.  I have reviewed the patient's chart and labs.  Questions were answered to the patient's satisfaction.    Patient began bleeding again profusely during the bowel prep.  Hgb dropped another 2 points.  Vitals have been very stable.  Suspect diverticular bleed.  Daryel November

## 2022-11-08 DIAGNOSIS — D62 Acute posthemorrhagic anemia: Secondary | ICD-10-CM

## 2022-11-08 DIAGNOSIS — I871 Compression of vein: Secondary | ICD-10-CM

## 2022-11-08 LAB — MAGNESIUM: Magnesium: 1.7 mg/dL (ref 1.7–2.4)

## 2022-11-08 LAB — CBC
HCT: 19.7 % — ABNORMAL LOW (ref 39.0–52.0)
Hemoglobin: 6.9 g/dL — CL (ref 13.0–17.0)
MCH: 32.5 pg (ref 26.0–34.0)
MCHC: 35 g/dL (ref 30.0–36.0)
MCV: 92.9 fL (ref 80.0–100.0)
Platelets: 110 10*3/uL — ABNORMAL LOW (ref 150–400)
RBC: 2.12 MIL/uL — ABNORMAL LOW (ref 4.22–5.81)
RDW: 12.9 % (ref 11.5–15.5)
WBC: 3.9 10*3/uL — ABNORMAL LOW (ref 4.0–10.5)
nRBC: 0 % (ref 0.0–0.2)

## 2022-11-08 LAB — HEMOGLOBIN AND HEMATOCRIT, BLOOD
HCT: 26.7 % — ABNORMAL LOW (ref 39.0–52.0)
Hemoglobin: 9.1 g/dL — ABNORMAL LOW (ref 13.0–17.0)

## 2022-11-08 LAB — BASIC METABOLIC PANEL
Anion gap: 9 (ref 5–15)
BUN: 12 mg/dL (ref 8–23)
CO2: 26 mmol/L (ref 22–32)
Calcium: 7.9 mg/dL — ABNORMAL LOW (ref 8.9–10.3)
Chloride: 103 mmol/L (ref 98–111)
Creatinine, Ser: 0.73 mg/dL (ref 0.61–1.24)
GFR, Estimated: >60 mL/min (ref >60)
Glucose, Bld: 115 mg/dL — ABNORMAL HIGH (ref 70–99)
Potassium: 3 mmol/L — ABNORMAL LOW (ref 3.5–5.1)
Sodium: 138 mmol/L (ref 135–145)

## 2022-11-08 LAB — ABO/RH: ABO/RH(D): A NEG

## 2022-11-08 LAB — PREPARE RBC (CROSSMATCH)

## 2022-11-08 MED ORDER — SODIUM CHLORIDE 0.9% IV SOLUTION: Freq: Once | INTRAVENOUS | Status: AC

## 2022-11-08 MED ORDER — POTASSIUM CHLORIDE CRYS ER 20 MEQ PO TBCR
40.0000 meq | EXTENDED_RELEASE_TABLET | Freq: Two times a day (BID) | ORAL | Status: DC
  Administered 2022-11-08: 40 meq via ORAL
  Filled 2022-11-08: qty 2

## 2022-11-08 NOTE — Progress Notes (Signed)
Patient has a hemoglobin of 6.9, MD notified,will continue to monitor

## 2022-11-08 NOTE — Progress Notes (Addendum)
Progress Note  Primary GI: Dr. Carlean Purl   Subjective  Chief Complaint: Painless hematochezia  Patient lying in bed, wife at bedside. Has not had any further bowel movements overnight. Hemoglobin went from 7.6-6.9, status post 1 unit PRBC. Patient denies abdominal pain, nausea vomiting. States outpatient he has constipation was previously on MiraLAX and Metamucil by Dr. Carlean Purl but has not been taking this.    Objective   Vital signs in last 24 hours: Temp:  [97.7 F (36.5 C)-98.4 F (36.9 C)] 98.4 F (36.9 C) (11/30 0919) Pulse Rate:  [75-88] 76 (11/30 0919) Resp:  [15-20] 17 (11/30 0919) BP: (122-144)/(61-81) 143/65 (11/30 0919) SpO2:  [97 %-100 %] 99 % (11/30 0919) Last BM Date : 11/07/22 Last BM recorded by nurses in past 5 days Stool Type: Type 7 (Liquid consistency with no solid pieces) (11/06/2022  9:00 PM)  General:   male in no acute distress  Heart:  Regular rate and rhythm; no murmurs Pulm: Clear anteriorly; no wheezing Abdomen:  Soft, Non-distended AB, Active bowel sounds. No tenderness . Without guarding and Without rebound, No organomegaly appreciated. Extremities:  without  edema. Neurologic:  Alert and  oriented x4;  No focal deficits.  Psych:  Cooperative. Normal mood and affect.  Intake/Output from previous day: 11/29 0701 - 11/30 0700 In: 1640 [P.O.:640; I.V.:1000] Out: -  Intake/Output this shift: Total I/O In: 381.9 [Blood:381.9] Out: 600 [Urine:600]  Studies/Results: CT ANGIO GI BLEED  Result Date: 11/06/2022 CLINICAL DATA:  Acute mesenteric ischemia EXAM: CTA ABDOMEN AND PELVIS WITHOUT AND WITH CONTRAST TECHNIQUE: Multidetector CT imaging of the abdomen and pelvis was performed using the standard protocol during bolus administration of intravenous contrast. Multiplanar reconstructed images and MIPs were obtained and reviewed to evaluate the vascular anatomy. RADIATION DOSE REDUCTION: This exam was performed according to the departmental  dose-optimization program which includes automated exposure control, adjustment of the mA and/or kV according to patient size and/or use of iterative reconstruction technique. CONTRAST:  20m OMNIPAQUE IOHEXOL 350 MG/ML SOLN COMPARISON:  CT abdomen pelvis 08/14/2021 FINDINGS: VASCULAR Aorta: Diffuse calcified atheromatous plaque without stenosis or aneurysm. Celiac: Patent without evidence of aneurysm, dissection, vasculitis or significant stenosis. SMA: Patent without evidence of aneurysm, dissection, vasculitis or significant stenosis. Renals: Both renal arteries are patent without evidence of aneurysm, dissection, vasculitis, fibromuscular dysplasia or significant stenosis. IMA: Mild stenosis at the origin of the inferior mesenteric artery. Inflow: Common iliac arteries are diffusely calcified without significant stenosis. No significant narrowing of the external or internal iliac arteries. Proximal Outflow: Bilateral common femoral and visualized portions of the superficial and profunda femoral arteries are patent without evidence of aneurysm, dissection, vasculitis or significant stenosis. Veins: Hepatic, portal, superior mesenteric, splenic veins are patent. Moderate compression of the left common iliac vein as it passes posterior to the right common iliac artery. Review of the MIP images confirms the above findings. NON-VASCULAR Lower chest: No acute abnormality. Hepatobiliary: No focal liver abnormality is seen. No gallstones, gallbladder wall thickening, or biliary dilatation. Pancreas: Unremarkable. No pancreatic ductal dilatation or surrounding inflammatory changes. Spleen: Normal in size without focal abnormality. Adrenals/Urinary Tract: Adrenal glands are normal. Two simple right renal cysts measuring 1.5 cm and 7.3 cm, do not require further imaging follow-up. Subcentimeter left renal hypodensities are too small to fully characterize. No hydronephrosis or hydroureter. Bladder is normal. Stomach/Bowel:  Small hiatal hernia. Nonspecific thickening of the wall the stomach within the herniated segment. No bowel dilatation to indicate ileus or obstruction. Descending and  sigmoid colon are extremely redundant. Appendix is normal. No active extravasation is identified within the gastrointestinal tract. Lymphatic: No enlarged abdominal or pelvic lymph nodes. Reproductive: Prostate is unremarkable. Other: No abdominal wall hernia or abnormality. No abdominopelvic ascites. 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 No significant femoral head collapse. L2-L3 and L4-L5 vertebral body fusion again noted. IMPRESSION: 1. No acute abnormality of the abdomen or pelvis. 2. No significant stenosis of the mesenteric arteries or veins. 3. No active extravasation identified within the gastrointestinal tract. 4. Moderate compression of the left common iliac vein as it passes posterior to the right common iliac artery. If the patient has asymmetric swelling of the left lower extremity, this would be consistent with May-Thurner syndrome. 5. Nonspecific thickening of the wall of the stomach within a small hiatal hernia may be due to gastritis. Please correlate with endoscopy findings. Aortic Atherosclerosis (ICD10-I70.0). Electronically Signed   By: Miachel Roux M.D.   On: 11/06/2022 11:54    Lab Results: Recent Labs    11/07/22 0421 11/07/22 1051 11/07/22 1521 11/08/22 0141  WBC 5.1  --  4.2 3.9*  HGB 8.4* 8.1* 7.6* 6.9*  HCT 24.5* 23.9* 21.9* 19.7*  PLT 138*  --  115* 110*   BMET Recent Labs    11/06/22 1035 11/07/22 0421 11/08/22 0141  NA 137 136 138  K 3.6 3.1* 3.0*  CL 104 107 103  CO2 24 21* 26  GLUCOSE 116* 144* 115*  BUN '15 16 12  '$ CREATININE 0.71 0.80 0.73  CALCIUM 8.4* 7.8* 7.9*   LFT Recent Labs    11/06/22 1035  PROT 5.1*  ALBUMIN 3.4*  AST 26  ALT 24  ALKPHOS 45  BILITOT 0.6   PT/INR Recent Labs    11/06/22 0649  LABPROT 14.2   INR 1.1     Scheduled Meds:  levothyroxine  50 mcg Oral QODAY   levothyroxine  75 mcg Oral QODAY   pantoprazole  40 mg Oral Daily   potassium chloride  40 mEq Oral BID   rosuvastatin  10 mg Oral Daily   Continuous Infusions:    Patient profile:   76 year old male with severe mitral regurgitation, recently diagnosed prostate cancer (not yet treated), psoriatic arthritis, history of thyroid cancer, with one day of multiple, painless, profuse bloody bowel movements with associated with light-headedness and 4 point drop in hgb.    Impression/Plan:   Painless hematochezia 4 g drop of hemoglobin CTA negative 11/07/2022 status post colonoscopy with Dr. Candis Schatz Adequate bowel prep from his prostate copious old blood entire colon, medium smallmouth and large mouth diverticula sigmoid, transverse and ascending and cecum.,  TI normal, unremarkable.  No active bleeding present. Hemoglobin is 8.1--<7.6-->6.9 without any active GI bleeding, likely this is more equalization of blood. Status post 1 PRBC, pending posttransfusion hemoglobin. Can likely be discharged today with follow-up CBC 2 weeks, will schedule office visit with our office 4 to 6 weeks.  Recent Labs    11/06/22 0649 11/06/22 1035 11/07/22 0421 11/07/22 1051 11/07/22 1521 11/08/22 0141  HGB 10.4* 10.1* 8.4* 8.1* 7.6* 6.9*   Constipation Start on MiraLAX 17 g at night, Metamucil in the morning.  Follow-up in our office 4 to 6 weeks will make outpatient appointment. Can call the office if this is not helping can give samples of Linzess.  Principal Problem:   GI bleed Active Problems:   Diverticulosis of colon with hemorrhage    LOS: 2  days   Cristian Nunez  11/08/2022, 10:20 AM  ------------------------------------------------------------------------------------------------  I have taken a history, reviewed the chart and examined the patient. I performed a substantive portion of this encounter, including  complete performance of at least one of the key components, in conjunction with the APP. I agree with the APP's note, impression and recommendations  76 year old male with history of thyroid cancer, newly diagnosed prostate cancer, psoriatic arthritis admitted with brisk right-sided diverticular bleed.  Colonoscopy yesterday with copious old blood throughout the colon and predominantly right-sided diverticulosis, without active bleeding or bleeding stigmata.  He has not passed any bloody stool since before his colonoscopy.  His hemoglobin drifted down another point from yesterday morning, and he received 1 unit PRBCs this morning. Given the absence of active bleeding for over 24 hours, I think it is okay for him to be discharged from a GI standpoint.  Another night of observation would not be unreasonable given the slight decrease in blood count and the severity of his bleed.  Defer to primary team.  Diverticular bleed, resolved spontaneously - Okay to discharge today or tomorrow per primary team - Recommended daily fiber supplementation to reduce risk of further diverticular complications - Suggested he avoid aspirin - MiraLAX nightly plus Metamucil daily for constipation - Follow-up as needed with Dr. Carlean Purl in our office  GI will sign off.  Please contact us if he has further evidence of overt bleeding.  Makina Skow E. Candis Schatz, MD [facility]

## 2022-11-08 NOTE — Progress Notes (Signed)
Mobility Specialist - Progress Note   11/08/22 1100  Mobility  Activity Ambulated with assistance in hallway  Level of Assistance Standby assist, set-up cues, supervision of patient - no hands on  Assistive Device None  Distance Ambulated (ft) 300 ft  Activity Response Tolerated well  $Mobility charge 1 Mobility    Pt received in bed agreeable to mobility. No complaints throughout. Left in bed w/ call bell in reach and all needs met.   [place] Specialist Please contact via SecureChat or Rehab office at [phone]

## 2022-11-08 NOTE — Consult Note (Signed)
[facility]ASC[facility] SPECIALISTS OF  [place]  ASSESSMENT / PLAN: 76 y.o. male with incidental discovery of left common iliac vein compression by overriding iliac vessels causing no symptoms.  The patient has no evidence of chronic venous insufficiency.  He has no history of DVT.  Recommend observation only for this finding.  Should he develop the left lower extremity DVT, this would likely be the source, and stenting should be considered.  He can follow-up with me as needed.  CHIEF COMPLAINT: CT finding  HISTORY OF PRESENT ILLNESS: Cristian Nunez is a 76 y.o. male admitted to the medicine teaching service for treatment of gastrointestinal bleeding.  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  On my exam, the patient denies any left lower extremity symptoms consistent with chronic venous insufficiency.  He has never had venous ulceration.  He has never had significant swelling in the left lower extremity.  He has no easy fatigability or tired, aching sensation in the left lower extremity.  Past Medical History:  Diagnosis Date   Abnormal EKG 12/09/2018   Arthritis    psoriatic   Esophageal stricture    Essential hypertension 12/09/2018   GERD (gastroesophageal reflux disease)    History of thyroid cancer    thyroid   Hyperlipidemia    Hypertension    Hypothyroidism    90% thyroidectomy 1973   Mitral regurgitation 11/18/2019   Mitral valve prolapse    Mixed dyslipidemia 12/09/2018   Psoriatic arthritis ([place])    Raynaud's syndrome    Thyroid cancer [facility])     Past Surgical History:  Procedure Laterality Date   BALLOON DILATION N/A 04/12/2021   Procedure: BALLOON DILATION;  Surgeon: Gatha Mayer, MD;  Location: [facility] ENDOSCOPY;  Service: Endoscopy;  Laterality: N/A;   COLONOSCOPY     ESOPHAGOGASTRODUODENOSCOPY (EGD) WITH PROPOFOL N/A 04/12/2021   Procedure:  ESOPHAGOGASTRODUODENOSCOPY (EGD) WITH PROPOFOL;  Surgeon: Gatha Mayer, MD;  Location: [place];  Service: Endoscopy;  Laterality: N/A;  needs fluoro   INGUINAL HERNIA REPAIR Left 01/28/2019   Procedure: LEFT INGUINAL HERNIA REPAIR WITH MESH;  Surgeon: Rolm Bookbinder, MD;  Location: [place];  Service: General;  Laterality: Left;  GENERAL ANESTHESIA AND TAP BLOCK   KNEE ARTHROSCOPY Left    RIGHT/LEFT HEART CATH AND CORONARY ANGIOGRAPHY N/A 02/03/2021   Procedure: RIGHT/LEFT HEART CATH AND CORONARY ANGIOGRAPHY;  Surgeon: Sherren Mocha, MD;  Location: [place] CV LAB;  Service: Cardiovascular;  Laterality: N/A;   TEE WITHOUT CARDIOVERSION N/A 04/12/2021   Procedure: TRANSESOPHAGEAL ECHOCARDIOGRAM (TEE);  Surgeon: Werner Lean, MD;  Location: [facility] ENDOSCOPY;  Service: Cardiovascular;  Laterality: N/A;   THYROIDECTOMY, PARTIAL     UMBILICAL HERNIA REPAIR     VARICOSE VEIN SURGERY Right     Family History  Problem Relation Age of Onset   Hypertension Mother    Arthritis Mother    Heart Problems Maternal Grandmother    Stroke Father    Crohn's disease Sister    Skin cancer Sister    Leukemia Brother    Heart attack Maternal Grandfather     Social History   Socioeconomic History   Marital status: Married    Spouse name: Not on file   Number of children: Not on file   Years of education: Not on file   Highest education level: Not on file  Occupational History   Occupation: retired  Comment: Pharmacist  Tobacco Use   Smoking status: Never   Smokeless tobacco: Never  Vaping Use   Vaping Use: Never used  Substance and Sexual Activity   Alcohol use: Yes    Alcohol/week: 14.0 - 21.0 standard drinks of alcohol    Types: 14 - 21 Cans of beer per week   Drug use: Never   Sexual activity: Not on file  Other Topics Concern   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   Never smoker 2 beers a day no caffeine no drug use no other tobacco   Social Determinants of Radio broadcast assistant Strain: Not on file  Food Insecurity: Not on file  Transportation Needs: Not on file  Physical Activity: Not on file  Stress: Not on file  Social Connections: Not on file  Intimate Partner Violence: Not on file    No Known Allergies  Current Facility-Administered Medications  Medication Dose Route Frequency Provider Last Rate Last Admin   acetaminophen (TYLENOL) tablet 650 mg  650 mg Oral Q6H PRN Lacinda Axon, MD       levothyroxine (SYNTHROID) tablet 50 mcg  50 mcg Oral Ansel Bong, MD   50 mcg at 11/07/22 8295   levothyroxine (SYNTHROID) tablet 75 mcg  75 mcg Oral Ansel Bong, MD   75 mcg at 11/08/22 0759   methocarbamol (ROBAXIN) tablet 500 mg  500 mg Oral Q12H PRN Lacinda Axon, MD       ondansetron [facility]) tablet 4 mg  4 mg Oral Q8H PRN Lacinda Axon, MD       Or   ondansetron (ZOFRAN) injection 4 mg  4 mg Intravenous Q8H PRN Lacinda Axon, MD       pantoprazole (PROTONIX) EC tablet 40 mg  40 mg Oral Daily Lacinda Axon, MD   40 mg at 11/07/22 1042   potassium chloride SA (KLOR-CON M) CR tablet 40 mEq  40 mEq Oral BID Angelique Blonder, DO       rosuvastatin (CRESTOR) tablet 10 mg  10 mg Oral Daily Lacinda Axon, MD   10 mg at 11/07/22 1042    PHYSICAL EXAM Vitals:   11/08/22 0557 11/08/22 0624 11/08/22 0753 11/08/22 0919  BP: 126/61 136/68 134/78 (!) 143/65  Pulse: 77 75 79 76  Resp: '18 18 16 17  '$ Temp: 97.7 F (36.5 C) 98.1 F (36.7 C) 97.9 F (36.6 C) 98.4 F (36.9 C)  TempSrc: Oral Oral Oral Oral  SpO2: 97% 98% 99% 99%  Weight:      Height:       Elderly man in no acute distress Regular rate and rhythm Unlabored breathing 2+ dorsalis pedis pulses  bilaterally No stigmata of chronic venous insufficiency bilaterally   PERTINENT LABORATORY AND RADIOLOGIC DATA  Most recent CBC    Latest Ref Rng & Units 11/08/2022    1:41 AM 11/07/2022    3:21 PM 11/07/2022   10:51 AM  CBC  WBC 4.0 - 10.5 K/uL 3.9  4.2    Hemoglobin 13.0 - 17.0 g/dL 6.9  7.6  8.1   Hematocrit 39.0 - 52.0 % 19.7  21.9  23.9   Platelets 150 - 400 K/uL 110  115       Most recent CMP    Latest Ref Rng & Units 11/08/2022    1:41 AM 11/07/2022    4:21 AM 11/06/2022   10:35 AM  CMP  Glucose 70 - 99 mg/dL 115  144  116   BUN 8 - 23 mg/dL '12  16  15   '$ Creatinine 0.61 - 1.24 mg/dL 0.73  0.80  0.71   Sodium 135 - 145 mmol/L 138  136  137   Potassium 3.5 - 5.1 mmol/L 3.0  3.1  3.6   Chloride 98 - 111 mmol/L 103  107  104   CO2 22 - 32 mmol/L '26  21  24   '$ Calcium 8.9 - 10.3 mg/dL 7.9  7.8  8.4   Total Protein 6.5 - 8.1 g/dL   5.1   Total Bilirubin 0.3 - 1.2 mg/dL   0.6   Alkaline Phos 38 - 126 U/L   45   AST 15 - 41 U/L   26   ALT 0 - 44 U/L   24     Renal function Estimated Creatinine Clearance: 73.6 mL/min (by C-G formula based on SCr of 0.73 mg/dL).  No results found for: "HGBA1C"  LDL Calculated  Date Value Ref Range Status  05/20/2019 80 0 - 99 mg/dL Final    CT angiogram personally reviewed in detail.  I agree with Dr. Dwaine Gale, there is significant impression of the left common iliac vein by the overriding iliac vessels.  Yevonne Aline. Stanford Breed, MD FACS [facility] Phone Number: [phone] 11/08/2022 10:34 AM   Total time spent on preparing this encounter including chart review, data review, collecting history, examining the patient, coordinating care for this new patient, 45 minutes.  Portions of this report may have been transcribed using voice recognition software.  Every effort has been made to ensure accuracy; however, inadvertent computerized transcription errors may still be present.

## 2022-11-08 NOTE — Discharge Summary (Addendum)
Name: Cristian Nunez MRN: [id number] DOB: 1946/03/02 76 y.o. PCP: Nicoletta Dress, MD  Date of Admission: 11/06/2022  6:08 AM Date of Discharge: 11/08/2022 Attending Physician: Dr. Jimmye Norman  Discharge Diagnosis: Principal Problem:   GI bleed Active Problems:   Diverticulosis of colon with hemorrhage   Acute blood loss anemia  Discharge Medications: Allergies as of 11/08/2022   No Known Allergies      Medication List     STOP taking these medications    aspirin-acetaminophen-caffeine 250-250-65 MG tablet Commonly known as: EXCEDRIN MIGRAINE   loperamide 2 MG capsule Commonly known as: IMODIUM   Pfizer COVID-19 Vac Bivalent injection Generic drug: COVID-19 mRNA bivalent vaccine Therapist, music)   Pfizer-BioNT COVID-19 Vac-TriS Susp injection Generic drug: COVID-19 mRNA Vac-TriS (Pfizer)   pseudoephedrine 30 MG tablet Commonly known as: SUDAFED       TAKE these medications    acetaminophen 500 MG tablet Commonly known as: TYLENOL Take 500 mg by mouth every 6 (six) hours as needed for moderate pain.   amLODipine 5 MG tablet Commonly known as: NORVASC Take 5 mg by mouth daily.   amoxicillin 500 MG capsule Commonly known as: AMOXIL Take 500 mg by mouth 3 (three) times daily as needed ("outbreaks around my mouth"). Pt reports taking this prn for outbreaks and will usually take it tid for about 3 days every 3ish months   b complex vitamins tablet Take 1 tablet by mouth daily.   clobetasol ointment 0.05 % Commonly known as: TEMOVATE Apply 1 application topically 2 (two) times daily as needed (irritation).   ketoconazole 2 % shampoo Commonly known as: NIZORAL Apply 1 application topically every 30 (thirty) days.   levothyroxine 50 MCG tablet Commonly known as: SYNTHROID Take 50 mcg by mouth every other day.   levothyroxine 75 MCG tablet Commonly known as: SYNTHROID Take 75 mcg by mouth every other day.   methocarbamol 500 MG tablet Commonly known as:  ROBAXIN Take 500 mg by mouth every 12 (twelve) hours as needed for muscle pain. for pain   omeprazole 20 MG capsule Commonly known as: PRILOSEC Take 20 mg by mouth daily.   rosuvastatin 10 MG tablet Commonly known as: CRESTOR Take 10 mg by mouth daily.   Taltz 80 MG/ML Soaj Generic drug: Ixekizumab Inject into the skin every 30 (thirty) days.   triamcinolone cream 0.1 % Commonly known as: KENALOG Apply 1 application  topically as needed (itching).        Disposition and follow-up:   Cristian Nunez was discharged from [facility] in Stable condition.  At the hospital follow up visit please address:  1.  Follow-up:  a. Painless hematochezia likely due to diverticulosis: check for any new episodes of bloody stools, repeat CBC  b. Vasovagal syncope: check if any more episodes   c. Hypokalemia: repeat BMP  2.  Labs / imaging needed at time of follow-up: CBC, BMP  3.  Pending labs/ test needing follow-up: none  4.  Medication Changes  -Avoid NSAIDs  -Start bowel regimen of daily miralax and metamucil per GI  Follow-up Appointments:  Follow-up Information     Nicoletta Dress, MD. Schedule an appointment as soon as possible for a visit in 1 week(s).   Specialty: Internal Medicine Why: Follow up after hospital visit and repeat blood work. Contact information: [address] [address] [phone]         Gatha Mayer, MD. Schedule an appointment as  soon as possible for a visit in 4 week(s).   Specialty: Gastroenterology Contact information: [address] [place] [address] [phone]         Cherre Robins, MD. Call.   Specialties: Vascular Surgery, Interventional Cardiology Contact information: [address] [place] [address] [phone]               -PCP in 1-2 weeks -GI in 4 weeks -Vascular Surgery as needed outpatient   Hospital Course by problem list: Cristian Nunez is  a 76 y.o. with a pertinent PMH of HTN, HLD, GERD, thyroid cancer status post thyroidectomy, recently diagnosed prostate adenocarcinoma, psoriatic arthritis with Raynaud's who presents with syncopal episode and bloody stools and admitted for painless hematochezia likely secondary to diverticulosis found on colonoscopy.    #Vasovagal Syncope #Painless hematochezia likely secondary to diverticulosis; acute blood loss anemia Patient presents to ED for syncopal episode after noticing multiple bloody stools. Endorsed prodromal symptoms and wife notes history of situational fainting after seeing blood. Initial labs significant for positive hemoccult, Hgb 10.4 from 15.3 a year ago, K 3.3, PT/INR 14.2/1.1. Vitals were hemodynamically stable. GI consulted and performed colonoscopy which showed diverticulosis and old blood throughout but no active bleeding present. Hgb dropped to 6.9 and he received 1 unit PRBC. Repeat H&H improved to 9.1. No further bloody stools. Remains asymptomatic and orthostatic vitals normal. He is stable for discharge with daily bowel regimen of miralax and metamucil. He is established with GI and will continue follow-up.   #Prostate adenocarcinoma Patient recently diagnosed with prostate cancer with PET scan showing prostate adenocarcinoma of posterior right aspect of prostate but no metastatic adenopathy, visceral or skeletal metastasis. Has radiation oncology appointment with Dr. Tammi Klippel on Friday. Patient will continue to outpatient follow up.    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 No history of DVT. Exam did not show LLE edema or erythema. VVS evaluated and recommended observation only. Patient can follow VVS in outpatient setting though no intervention is needed as long as he remains asymptomatic.  This is an anatomic variant.    #HLD #HTN Normotensive  initially during hospital course and amlodipine was temporarily stopped. BP slightly elevated on day of discharge. Discussed with patient he may restart amlodipine '5mg'$  and he has BP cuff at home to check. Advised patient to hold amlodipine if he becomes symptomatic. We continued his home Crestor 10 mg daily.   #Thyroid cancer s/p thyroidectomy  #Hypothyroidism History of thyroid cancer status post 90% thyroidectomy 1973. Alternates 50 mcg and 75 mcg Synthroid daily. TSH was normal and continued his home Synthroid regimen.     #Chronic back pain #Hx L2-L3, L4-L5 vertebral body fusion Patient reports chronic back pain requiring daily Excedrin and PRN robaxin. Advised patient to avoid Excedrin and NSAIDs.    #GERD PTA on omeprazole 20 mg which was continued during hospital course.    #Psoriatic arthritis  #Raynaud's syndrome  No acute flare ups. Exam showed skin color changes of fingers and toes consistent with Raynaud's.    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 Underwent EGD with dilation and May 2022. No problems with swallowing.    Discharge Subjective: Patient reports feeling better and able to ambulate well around unit. No further bloody stools. Denies lightheadedness, dizziness or any presyncopal symptoms when standing up.   Discharge  Exam:   BP (!) 145/84   Pulse 92   Temp 98.1 F (36.7 C) (Oral)   Resp 17   Ht '5\' 8"'$  (1.727 m)   Wt 73.7 kg   SpO2 100%   BMI 24.70 kg/m  Constitutional: well-appearing male, sitting in chair, in no acute distress HENT: normocephalic atraumatic Neck: supple Cardiovascular: regular rate and rhythm, systolic murmur heard best over apex Pulmonary/Chest: normal work of breathing on room air, lungs clear to auscultation bilaterally MSK: normal bulk and tone Neurological: alert & oriented x 3 Skin: warm and dry Psych: normal mood and behavior   Pertinent Labs, Studies, and Procedures:      Latest Ref Rng & Units 11/08/2022   12:20 PM 11/08/2022    1:41 AM 11/07/2022    3:21 PM  CBC  WBC 4.0 - 10.5 K/uL  3.9  4.2   Hemoglobin 13.0 - 17.0 g/dL 9.1  6.9  7.6   Hematocrit 39.0 - 52.0 % 26.7  19.7  21.9   Platelets 150 - 400 K/uL  110  115        Latest Ref Rng & Units 11/08/2022    1:41 AM 11/07/2022    4:21 AM 11/06/2022   10:35 AM  CMP  Glucose 70 - 99 mg/dL 115  144  116   BUN 8 - 23 mg/dL '12  16  15   '$ Creatinine 0.61 - 1.24 mg/dL 0.73  0.80  0.71   Sodium 135 - 145 mmol/L 138  136  137   Potassium 3.5 - 5.1 mmol/L 3.0  3.1  3.6   Chloride 98 - 111 mmol/L 103  107  104   CO2 22 - 32 mmol/L '26  21  24   '$ Calcium 8.9 - 10.3 mg/dL 7.9  7.8  8.4   Total Protein 6.5 - 8.1 g/dL   5.1   Total Bilirubin 0.3 - 1.2 mg/dL   0.6   Alkaline Phos 38 - 126 U/L   45   AST 15 - 41 U/L   26   ALT 0 - 44 U/L   24     CT ANGIO GI BLEED  Result Date: 11/06/2022 CLINICAL DATA:  Acute mesenteric ischemia EXAM: CTA ABDOMEN AND PELVIS WITHOUT AND WITH CONTRAST TECHNIQUE: Multidetector CT imaging of the abdomen and pelvis was performed using the standard protocol during bolus administration of intravenous contrast. Multiplanar reconstructed images and MIPs were obtained and reviewed to evaluate the vascular anatomy. RADIATION DOSE REDUCTION: This exam was performed according to the departmental dose-optimization program which includes automated exposure control, adjustment of the mA and/or kV according to patient size and/or use of iterative reconstruction technique. CONTRAST:  80m OMNIPAQUE IOHEXOL 350 MG/ML SOLN COMPARISON:  CT abdomen pelvis 08/14/2021 FINDINGS: VASCULAR Aorta: Diffuse calcified atheromatous plaque without stenosis or aneurysm. Celiac: Patent without evidence of aneurysm, dissection, vasculitis or significant stenosis. SMA: Patent without evidence of aneurysm, dissection, vasculitis or significant stenosis. Renals: Both renal arteries are patent without evidence of  aneurysm, dissection, vasculitis, fibromuscular dysplasia or significant stenosis. IMA: Mild stenosis at the origin of the inferior mesenteric artery. Inflow: Common iliac arteries are diffusely calcified without significant stenosis. No significant narrowing of the external or internal iliac arteries. Proximal Outflow: Bilateral common femoral and visualized portions of the superficial and profunda femoral arteries are patent without evidence of aneurysm, dissection, vasculitis or significant stenosis. Veins: Hepatic, portal, superior mesenteric, splenic veins are patent. Moderate compression of the left common iliac vein as it  passes posterior to the right common iliac artery. Review of the MIP images confirms the above findings. NON-VASCULAR Lower chest: No acute abnormality. Hepatobiliary: No focal liver abnormality is seen. No gallstones, gallbladder wall thickening, or biliary dilatation. Pancreas: Unremarkable. No pancreatic ductal dilatation or surrounding inflammatory changes. Spleen: Normal in size without focal abnormality. Adrenals/Urinary Tract: Adrenal glands are normal. Two simple right renal cysts measuring 1.5 cm and 7.3 cm, do not require further imaging follow-up. Subcentimeter left renal hypodensities are too small to fully characterize. No hydronephrosis or hydroureter. Bladder is normal. Stomach/Bowel: Small hiatal hernia. Nonspecific thickening of the wall the stomach within the herniated segment. No bowel dilatation to indicate ileus or obstruction. Descending and sigmoid colon are extremely redundant. Appendix is normal. No active extravasation is identified within the gastrointestinal tract. Lymphatic: No enlarged abdominal or pelvic lymph nodes. Reproductive: Prostate is unremarkable. Other: No abdominal wall hernia or abnormality. No abdominopelvic ascites. 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 No significant femoral head  collapse. L2-L3 and L4-L5 vertebral body fusion again noted. IMPRESSION: 1. No acute abnormality of the abdomen or pelvis. 2. No significant stenosis of the mesenteric arteries or veins. 3. No active extravasation identified within the gastrointestinal tract. 4. Moderate compression of the left common iliac vein as it passes posterior to the right common iliac artery. If the patient has asymmetric swelling of the left lower extremity, this would be consistent with May-Thurner syndrome. 5. Nonspecific thickening of the wall of the stomach within a small hiatal hernia may be due to gastritis. Please correlate with endoscopy findings. Aortic Atherosclerosis (ICD10-I70.0). Electronically Signed   By: Miachel Roux M.D.   On: 11/06/2022 11:54     Discharge Instructions: Discharge Instructions     Call MD for:   Complete by: As directed    Blood stools   Call MD for:  difficulty breathing, headache or visual disturbances   Complete by: As directed    Call MD for:  extreme fatigue   Complete by: As directed    Call MD for:  hives   Complete by: As directed    Call MD for:  persistant dizziness or light-headedness   Complete by: As directed    Call MD for:  persistant nausea and vomiting   Complete by: As directed    Call MD for:  severe uncontrolled pain   Complete by: As directed    Call MD for:  temperature >100.4   Complete by: As directed    Diet - low sodium heart healthy   Complete by: As directed    Discharge instructions   Complete by: As directed    Mr. Cristian Nunez,   You were hospitalized for GI bleed. Colonoscopy did not show any active bleeding. Your red blood count, hemoglobin, has improved after receiving 1 unit transfusion. Please follow the recommendations per GI as listed below. You may follow-up with vascular surgery as needed for the compressed vein in your leg. If you notice any further bloody stools, please return to the emergency room for evaluation. Thank you for allowing [place]  to be part of your care.   We held your amlodipine since blood pressure was normal. You may restart it and can check your blood pressure with your blood pressure cuff at home. If you notice any lightheadedness or dizziness, I would hold the amlodipine until you see your primary care.   Please schedule follow up appointments with:  -Primary Care: Dr. Delena Bali in  1-2 weeks for follow-up and recheck labs, you may keep your original appointment in January as well.  -GI: Dr. Carlean Purl for continued follow up -[facility]: Yevonne Aline. Stanford Breed, MD FACS - Office Phone Number: [phone]  GI Recommendations: Miralax is an osmotic laxative.  It only brings more water into the stool.  This is safe to take daily.  Can take up to 17 gram of miralax twice a day.  Mix with juice or coffee.  Start 1 capful at night for 3-4 days and reassess your response in 3-4 days.  You can increase and decrease the dose based on your response.  Remember, it can take up to 3-4 days to take effect OR for the effects to wear off.   I often pair this with metamucil in the morning to help assure the stool is not too loose.  If this is not helping with constipation, call the GI office and we can provide linzess samples to try.   Please note these changes made to your medications:  *Please STOP taking:  -Avoid Excedrin and other NSAIDs (such as ibuprofen, advil or aleve)  Please make sure to schedule close follow up appointments. If you notice bloody stools, return to the emergency room for evaluation.   Increase activity slowly   Complete by: As directed        Signed: Angelique Blonder, DO 11/08/2022, 5:35 PM   Pager: [phone]

## 2022-11-08 NOTE — Plan of Care (Signed)
Patient ID: Cristian Nunez, male   DOB: 04-Nov-1946, 76 y.o.   MRN: [id number]  Problem: Education: Goal: Knowledge of General Education information will improve Description: Including pain rating scale, medication(s)/side effects and non-pharmacologic comfort measures Outcome: Adequate for Discharge   Problem: Health Behavior/Discharge Planning: Goal: Ability to manage health-related needs will improve Outcome: Adequate for Discharge   Problem: Clinical Measurements: Goal: Ability to maintain clinical measurements within normal limits will improve Outcome: Adequate for Discharge Goal: Will remain free from infection Outcome: Adequate for Discharge Goal: Diagnostic test results will improve Outcome: Adequate for Discharge Goal: Respiratory complications will improve Outcome: Adequate for Discharge Goal: Cardiovascular complication will be avoided Outcome: Adequate for Discharge   Problem: Activity: Goal: Risk for activity intolerance will decrease Outcome: Adequate for Discharge   Problem: Nutrition: Goal: Adequate nutrition will be maintained Outcome: Adequate for Discharge   Problem: Coping: Goal: Level of anxiety will decrease Outcome: Adequate for Discharge   Problem: Elimination: Goal: Will not experience complications related to bowel motility Outcome: Adequate for Discharge Goal: Will not experience complications related to urinary retention Outcome: Adequate for Discharge   Problem: Pain Managment: Goal: General experience of comfort will improve Outcome: Adequate for Discharge   Problem: Safety: Goal: Ability to remain free from injury will improve Outcome: Adequate for Discharge   Problem: Skin Integrity: Goal: Risk for impaired skin integrity will decrease Outcome: Adequate for Discharge  Haydee Salter, RN

## 2022-11-08 NOTE — Discharge Instructions (Addendum)
Mr. Cristian Nunez, Cristian Nunez were hospitalized for GI bleed. Colonoscopy did not show any active bleeding. Your red blood count, hemoglobin, has improved after receiving 1 unit transfusion. Please follow the recommendations per GI as listed below. You may follow-up with vascular surgery as needed for the compressed vein in your leg. If you notice any further bloody stools, please return to the emergency room for evaluation. Thank you for allowing [place] to be part of your care.   We held your amlodipine since blood pressure was normal. You may restart it and can check your blood pressure with your blood pressure cuff at home. If you notice any lightheadedness or dizziness, I would hold the amlodipine until you see your primary care.   Please schedule follow up appointments with:  -Primary Care: Dr. Delena Bali in 1-2 weeks for follow-up and recheck labs, you may keep your original appointment in January as well.  -GI: Dr. Carlean Purl for continued follow up -[facility]: Yevonne Aline. Stanford Breed, MD FACS - Office Phone Number: [phone]  GI Recommendations: Miralax is an osmotic laxative.  It only brings more water into the stool.  This is safe to take daily.  Can take up to 17 gram of miralax twice a day.  Mix with juice or coffee.  Start 1 capful at night for 3-4 days and reassess your response in 3-4 days.  You can increase and decrease the dose based on your response.  Remember, it can take up to 3-4 days to take effect OR for the effects to wear off.   I often pair this with metamucil in the morning to help assure the stool is not too loose.  If this is not helping with constipation, call the GI office and we can provide linzess samples to try.   Please note these changes made to your medications:  *Please STOP taking:  -Avoid Excedrin and other NSAIDs (such as ibuprofen, advil or aleve)  Please make sure to schedule close follow up appointments. If you notice bloody stools,  return to the emergency room for evaluation.   Please call our clinic if you have any questions or concerns, we may be able to help and keep you from a long and expensive emergency room wait. Our clinic and after hours phone number is [phone], the best time to call is Monday through Friday 9 am to 4 pm but there is always someone available 24/7 if you have an emergency.

## 2022-11-08 NOTE — Progress Notes (Signed)
   11/08/22 0251  Provider Notification  Provider Name/Title DR Ferne Coe  Date Provider Notified 11/08/22  Time Provider Notified [phone]  Method of Notification Page  Notification Reason Critical Result (Hemoglobin of 6.9)  Date Critical Result Received 11/08/22  Provider response See new orders  Date of Provider Response 11/08/22  Time of Provider Response [phone]

## 2022-11-09 ENCOUNTER — Ambulatory Visit
Admission: RE | Admit: 2022-11-09 | Discharge: 2022-11-09 | Disposition: A | Discharge status: Home / Self Care | Payer: Medicare Other | Source: Ambulatory Visit | Attending: Radiation Oncology | Admitting: Radiation Oncology

## 2022-11-09 ENCOUNTER — Telehealth: Payer: Self-pay | Admitting: Radiation Oncology

## 2022-11-09 ENCOUNTER — Encounter (HOSPITAL_COMMUNITY): Payer: Self-pay | Admitting: Gastroenterology

## 2022-11-09 VITALS — Ht 68.0 in | Wt 162.0 lb

## 2022-11-09 DIAGNOSIS — C61 Malignant neoplasm of prostate: Secondary | ICD-10-CM

## 2022-11-09 DIAGNOSIS — Z191 Hormone sensitive malignancy status: Secondary | ICD-10-CM | POA: Diagnosis not present

## 2022-11-09 LAB — TYPE AND SCREEN
ABO/RH(D): A NEG
Antibody Screen: NEGATIVE
Unit division: 0

## 2022-11-09 LAB — BPAM RBC
Blood Product Expiration Date: 202312032359
ISSUE DATE / TIME: 202311300559
Unit Type and Rh: 600

## 2022-11-09 NOTE — Telephone Encounter (Signed)
Patient called and requested to reschedule today's appointment. Patient is aware of changes. Sent message to PA/nurse to update.

## 2022-11-09 NOTE — Progress Notes (Signed)
Radiation Oncology         [phone] ________________________________  Initial Outpatient Consultation - Conducted via Telephone due to current COVID-19 concerns for limiting patient exposure  Name: Cristian Nunez MRN: [id number]  Date: 11/09/2022  DOB: [birth year]  [id number], Lora Havens, MD  Franchot Gallo, MD   REFERRING PHYSICIAN: Franchot Gallo, MD  DIAGNOSIS: 76 y.o. gentleman with Stage T2a adenocarcinoma of the prostate with Gleason score of 4+3, and PSA of 6.6.    ICD-10-CM   1. Malignant neoplasm of prostate [facility])  C61       HISTORY OF PRESENT ILLNESS: Cristian Nunez is a 76 y.o. male with a diagnosis of prostate cancer and a history of thyroid cancer. He is a retired Software engineer and was noted to have an elevated PSA of 6.6 by his primary care physician, Dr. Delena Bali in 06/2022. His PSA had been slowly rising from 3.9 in 12/2020. Accordingly, he was referred for evaluation in urology by Dr. Diona Fanti on 08/01/22,  digital rectal examination was performed at that time revealing a 6 mm prostate nodule in the right base. He was also incidentally found to have a solid mass on the right testis measuring approximately 1 cm.  The patient proceeded to transrectal ultrasound with 12 biopsies of the prostate and testicular ultrasound on 09/12/22.  The prostate volume measured 25 cc.  Out of 12 core biopsies, 10 were positive.  The maximum Gleason score was 4+3, and this was seen in the left base lateral, left mid lateral (small focus), and left base. Additionally, all six right-sided cores showed Gleason 3+4 disease. Regarding the right testicular mass, this was confirmed to appear solid with some debris internally, warranting further evaluation, likely biopsy.  He underwent staging PSMA PET scan on 09/24/22 showing focal activity in the posterior right prostate gland without evidence of metastatic adenopathy, visceral metastasis, or skeletal metastasis. A single focus of radiotracer uptake  was noted in the anterior left 5th rib and is favored benign.  The patient reviewed the biopsy results with his urologist and he has kindly been referred today for discussion of potential radiation treatment options.   PREVIOUS RADIATION THERAPY: Yes 1973: Thyroid  PAST MEDICAL HISTORY:  Past Medical History:  Diagnosis Date   Abnormal EKG 12/09/2018   Arthritis    psoriatic   Esophageal stricture    Essential hypertension 12/09/2018   GERD (gastroesophageal reflux disease)    History of thyroid cancer    thyroid   Hyperlipidemia    Hypertension    Hypothyroidism    90% thyroidectomy 1973   Mitral regurgitation 11/18/2019   Mitral valve prolapse    Mixed dyslipidemia 12/09/2018   Psoriatic arthritis ([place])    Raynaud's syndrome    Thyroid cancer ([place])       PAST SURGICAL HISTORY: Past Surgical History:  Procedure Laterality Date   BALLOON DILATION N/A 04/12/2021   Procedure: BALLOON DILATION;  Surgeon: Gatha Mayer, MD;  Location: [place];  Service: Endoscopy;  Laterality: N/A;   COLONOSCOPY     COLONOSCOPY WITH PROPOFOL N/A 11/07/2022   Procedure: COLONOSCOPY WITH PROPOFOL;  Surgeon: Daryel November, MD;  Location: [place];  Service: Gastroenterology;  Laterality: N/A;   ESOPHAGOGASTRODUODENOSCOPY (EGD) WITH PROPOFOL N/A 04/12/2021   Procedure: ESOPHAGOGASTRODUODENOSCOPY (EGD) WITH PROPOFOL;  Surgeon: Gatha Mayer, MD;  Location: [place];  Service: Endoscopy;  Laterality: N/A;  needs fluoro   INGUINAL HERNIA REPAIR Left 01/28/2019   Procedure: LEFT INGUINAL HERNIA REPAIR WITH MESH;  Surgeon: Rolm Bookbinder, MD;  Location: [place];  Service: General;  Laterality: Left;  GENERAL ANESTHESIA AND TAP BLOCK   KNEE ARTHROSCOPY Left    RIGHT/LEFT HEART CATH AND CORONARY ANGIOGRAPHY N/A 02/03/2021   Procedure: RIGHT/LEFT HEART CATH AND CORONARY ANGIOGRAPHY;  Surgeon: Sherren Mocha, MD;  Location: [place] [facility];  Service: Cardiovascular;  Laterality: N/A;    TEE WITHOUT CARDIOVERSION N/A 04/12/2021   Procedure: TRANSESOPHAGEAL ECHOCARDIOGRAM (TEE);  Surgeon: Werner Lean, MD;  Location[facility] ENDOSCOPY;  Service: Cardiovascular;  Laterality: N/A;   THYROIDECTOMY, PARTIAL     UMBILICAL HERNIA REPAIR     VARICOSE VEIN SURGERY Right     FAMILY HISTORY:  Family History  Problem Relation Age of Onset   Hypertension Mother    Arthritis Mother    Heart Problems Maternal Grandmother    Stroke Father    Crohn's disease Sister    Skin cancer Sister    Leukemia Brother    Heart attack Maternal Grandfather     SOCIAL HISTORY:  Social History   Socioeconomic History   Marital status: Married    Spouse name: Not on file   Number of children: Not on file   Years of education: Not on file   Highest education level: Not on file  Occupational History   Occupation: retired    Comment: Pharmacist  Tobacco Use   Smoking status: Never   Smokeless tobacco: Never  Vaping Use   Vaping Use: Never used  Substance and Sexual Activity   Alcohol use: Yes    Alcohol/week: 14.0 - 21.0 standard drinks of alcohol    Types: 14 - 21 Cans of beer per week   Drug use: Never   Sexual activity: Not on file  Other Topics Concern   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  MEDICATIONS:  Current Outpatient Medications  Medication Sig Dispense Refill    acetaminophen (TYLENOL) 500 MG tablet Take 500 mg by mouth every 6 (six) hours as needed for moderate pain.     amLODipine (NORVASC) 5 MG tablet Take 5 mg by mouth daily.     amoxicillin (AMOXIL) 500 MG capsule Take 500 mg by mouth 3 (three) times daily as needed ("outbreaks around my mouth"). Pt reports taking this prn for outbreaks and will usually take it tid for about 3 days every 3ish months     b complex vitamins tablet Take 1 tablet by mouth daily.     clobetasol ointment (TEMOVATE) 7.40 % Apply 1 application topically 2 (two) times daily as needed (irritation).     Ixekizumab (TALTZ) 80 MG/ML SOAJ Inject into the skin every 30 (thirty) days.     ketoconazole (NIZORAL) 2 % shampoo Apply 1 application topically every 30 (thirty) days.     levothyroxine (SYNTHROID) 50 MCG tablet  Take 50 mcg by mouth every other day.     levothyroxine (SYNTHROID) 75 MCG tablet Take 75 mcg by mouth every other day.     methocarbamol (ROBAXIN) 500 MG tablet Take 500 mg by mouth every 12 (twelve) hours as needed for muscle pain. for pain     omeprazole (PRILOSEC) 20 MG capsule Take 20 mg by mouth daily.     rosuvastatin (CRESTOR) 10 MG tablet Take 10 mg by mouth daily.     triamcinolone cream (KENALOG) 0.1 % Apply 1 application  topically as needed (itching).     No current facility-administered medications for this encounter.    REVIEW OF SYSTEMS:  On review of systems, the patient reports that he is doing well overall. He denies any chest pain, shortness of breath, cough, fevers, chills, night sweats, unintended weight changes. He denies any bowel disturbances, and denies abdominal pain, nausea or vomiting. He denies any new musculoskeletal or joint aches or pains. His IPSS was 12, indicating moderate urinary symptoms. His SHIM was 1, indicating he has severe erectile dysfunction. A complete review of systems is obtained and is otherwise negative.    PHYSICAL EXAM:  Wt Readings from Last 3 Encounters:   11/09/22 162 lb (73.5 kg)  11/06/22 162 lb 7.7 oz (73.7 kg)  05/14/22 163 lb 6.4 oz (74.1 kg)   Temp Readings from Last 3 Encounters:  11/08/22 98.1 F (36.7 C) (Oral)  08/14/21 98.1 F (36.7 C) (Oral)  04/12/21 (!) 97.3 F (36.3 C) (Temporal)   BP Readings from Last 3 Encounters:  11/08/22 (!) 145/84  05/14/22 130/82  10/25/21 120/82   Pulse Readings from Last 3 Encounters:  11/08/22 92  05/14/22 84  10/25/21 66   Pain Assessment Pain Score: 0-No pain/10  Physical exam not performed in light of telephone encounter.  KPS = 100  100 - Normal; no complaints; no evidence of disease. 90   - Able to carry on normal activity; minor signs or symptoms of disease. 80   - Normal activity with effort; some signs or symptoms of disease. 28   - Cares for self; unable to carry on normal activity or to do active work. 60   - Requires occasional assistance, but is able to care for most of his personal needs. 50   - Requires considerable assistance and frequent medical care. 55   - Disabled; requires special care and assistance. 44   - Severely disabled; hospital admission is indicated although death not imminent. 4   - Very sick; hospital admission necessary; active supportive treatment necessary. 10   - Moribund; fatal processes progressing rapidly. 0     - Dead  Karnofsky DA, Abelmann Winfield, Craver LS and Burchenal [facility] [phone]) The use of the nitrogen mustards in the palliative treatment of carcinoma: with particular reference to bronchogenic carcinoma Cancer 1 634-56  LABORATORY DATA:  Lab Results  Component Value Date   WBC 3.9 (L) 11/08/2022   HGB 9.1 (L) 11/08/2022   HCT 26.7 (L) 11/08/2022   MCV 92.9 11/08/2022   PLT 110 (L) 11/08/2022   Lab Results  Component Value Date   NA 138 11/08/2022   K 3.0 (L) 11/08/2022   CL 103 11/08/2022   CO2 26 11/08/2022   Lab Results  Component Value Date   ALT 24 11/06/2022   AST 26 11/06/2022   ALKPHOS 45 11/06/2022   BILITOT 0.6  11/06/2022     RADIOGRAPHY: CT ANGIO GI BLEED  Result Date: 11/06/2022 CLINICAL  DATA:  Acute mesenteric ischemia EXAM: CTA ABDOMEN AND PELVIS WITHOUT AND WITH CONTRAST TECHNIQUE: Multidetector CT imaging of the abdomen and pelvis was performed using the standard protocol during bolus administration of intravenous contrast. Multiplanar reconstructed images and MIPs were obtained and reviewed to evaluate the vascular anatomy. RADIATION DOSE REDUCTION: This exam was performed according to the departmental dose-optimization program which includes automated exposure control, adjustment of the mA and/or kV according to patient size and/or use of iterative reconstruction technique. CONTRAST:  71m OMNIPAQUE IOHEXOL 350 MG/ML SOLN COMPARISON:  CT abdomen pelvis 08/14/2021 FINDINGS: VASCULAR Aorta: Diffuse calcified atheromatous plaque without stenosis or aneurysm. Celiac: Patent without evidence of aneurysm, dissection, vasculitis or significant stenosis. SMA: Patent without evidence of aneurysm, dissection, vasculitis or significant stenosis. Renals: Both renal arteries are patent without evidence of aneurysm, dissection, vasculitis, fibromuscular dysplasia or significant stenosis. IMA: Mild stenosis at the origin of the inferior mesenteric artery. Inflow: Common iliac arteries are diffusely calcified without significant stenosis. No significant narrowing of the external or internal iliac arteries. Proximal Outflow: Bilateral common femoral and visualized portions of the superficial and profunda femoral arteries are patent without evidence of aneurysm, dissection, vasculitis or significant stenosis. Veins: Hepatic, portal, superior mesenteric, splenic veins are patent. Moderate compression of the left common iliac vein as it passes posterior to the right common iliac artery. Review of the MIP images confirms the above findings. NON-VASCULAR Lower chest: No acute abnormality. Hepatobiliary: No focal liver abnormality  is seen. No gallstones, gallbladder wall thickening, or biliary dilatation. Pancreas: Unremarkable. No pancreatic ductal dilatation or surrounding inflammatory changes. Spleen: Normal in size without focal abnormality. Adrenals/Urinary Tract: Adrenal glands are normal. Two simple right renal cysts measuring 1.5 cm and 7.3 cm, do not require further imaging follow-up. Subcentimeter left renal hypodensities are too small to fully characterize. No hydronephrosis or hydroureter. Bladder is normal. Stomach/Bowel: Small hiatal hernia. Nonspecific thickening of the wall the stomach within the herniated segment. No bowel dilatation to indicate ileus or obstruction. Descending and sigmoid colon are extremely redundant. Appendix is normal. No active extravasation is identified within the gastrointestinal tract. Lymphatic: No enlarged abdominal or pelvic lymph nodes. Reproductive: Prostate is unremarkable. Other: No abdominal wall hernia or abnormality. No abdominopelvic ascites. 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 No significant femoral head collapse. L2-L3 and L4-L5 vertebral body fusion again noted. IMPRESSION: 1. No acute abnormality of the abdomen or pelvis. 2. No significant stenosis of the mesenteric arteries or veins. 3. No active extravasation identified within the gastrointestinal tract. 4. Moderate compression of the left common iliac vein as it passes posterior to the right common iliac artery. If the patient has asymmetric swelling of the left lower extremity, this would be consistent with May-Thurner syndrome. 5. Nonspecific thickening of the wall of the stomach within a small hiatal hernia may be due to gastritis. Please correlate with endoscopy findings. Aortic Atherosclerosis (ICD10-I70.0). Electronically Signed   By: FMiachel RouxM.D.   On: 11/06/2022 11:54   ECHOCARDIOGRAM COMPLETE  Result Date: 10/26/2022    ECHOCARDIOGRAM REPORT    Patient Name:   Cristian PATTILLODate of Exam: 10/26/2022 Medical Rec #:  [id number]     Height:       68.0 in Accession #:    [id number]    Weight:       163.4 lb Date of Birth:  912/16/1947      BSA:  1.876 m Patient Age:    47 years       BP:           130/82 mmHg Patient Gender: M              HR:           64 bpm. Exam Location:  [place] Procedure: 2D Echo, 3D Echo, Cardiac Doppler, Color Doppler and Strain Analysis Indications:    I34 Mitral regurgitation  History:        Patient has prior history of Echocardiogram examinations, most                 recent 04/24/2022. Mitral Valve Prolapse; Risk                 Factors:Hypertension and Dyslipidemia.  Sonographer:    Basilia Jumbo BS, RDCS Referring Phys: [place]  1. Left ventricular ejection fraction, by estimation, is 50 to 55%. The left ventricle has low normal function. The left ventricle demonstrates global hypokinesis. The left ventricular internal cavity size was mildly dilated. LVIDs 4.2 cm. There is moderate asymmetric left ventricular hypertrophy of the basal-septal segment. Left ventricular diastolic parameters are indeterminate.  2. Right ventricular systolic function is normal. The right ventricular size is normal. There is normal pulmonary artery systolic pressure. The estimated right ventricular systolic pressure is 56.2 mmHg.  3. Left atrial size was severely dilated.  4. 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 Severe mitral valve regurgitation.  5. The aortic valve is tricuspid. Aortic valve regurgitation is not visualized. No aortic stenosis is present.  6. Aortic dilatation noted. There is mild dilatation of the ascending aorta, measuring 39 mm.  7. The inferior vena cava is normal in size with greater than 50% respiratory variability, suggesting right atrial pressure of 3 mmHg. Comparison(s): 04/24/22 EF 60-65%. Severe MR. FINDINGS  Left Ventricle: Left ventricular ejection fraction, by estimation,  is 50 to 55%. The left ventricle has low normal function. The left ventricle demonstrates global hypokinesis. The left ventricular internal cavity size was mildly dilated. There is moderate asymmetric left ventricular hypertrophy of the basal-septal segment. Left ventricular diastolic parameters are indeterminate. Right Ventricle: The right ventricular size is normal. No increase in right ventricular wall thickness. Right ventricular systolic function is normal. There is normal pulmonary artery systolic pressure. The tricuspid regurgitant velocity is 2.64 m/s, and  with an assumed right atrial pressure of 3 mmHg, the estimated right ventricular systolic pressure is 56.3 mmHg. Left Atrium: Left atrial size was severely dilated. Right Atrium: Right atrial size was normal in size. Pericardium: There is no evidence of pericardial effusion. Mitral Valve: The mitral valve is myxomatous. Severe mitral valve regurgitation. Tricuspid Valve: The tricuspid valve is normal in structure. Tricuspid valve regurgitation is trivial. Aortic Valve: The aortic valve is tricuspid. Aortic valve regurgitation is not visualized. No aortic stenosis is present. Pulmonic Valve: The pulmonic valve was not well visualized. Pulmonic valve regurgitation is trivial. Aorta: The aortic root is normal in size and structure and aortic dilatation noted. There is mild dilatation of the ascending aorta, measuring 39 mm. Venous: The inferior vena cava is normal in size with greater than 50% respiratory variability, suggesting right atrial pressure of 3 mmHg. IAS/Shunts: The interatrial septum was not well visualized.  LEFT VENTRICLE PLAX 2D LVIDd:         5.70 cm   Diastology LVIDs:         4.20 cm  LV e' medial:    8.08 cm/s LV PW:         1.20 cm   LV E/e' medial:  12.9 LV IVS:        1.00 cm   LV e' lateral:   5.92 cm/s LVOT diam:     2.50 cm   LV E/e' lateral: 17.6 LV SV:         89 LV SV Index:   48        2D Longitudinal Strain LVOT Area:     4.91  cm  2D Strain GLS (A2C):   -9.5 %                          2D Strain GLS (A3C):   -16.9 %                          2D Strain GLS (A4C):   -12.0 %                          2D Strain GLS Avg:     -12.8 %                           3D Volume EF:                          3D EF:        48 %                          LV EDV:       132 ml                          LV ESV:       69 ml                          LV SV:        63 ml RIGHT VENTRICLE             IVC RV Basal diam:  3.60 cm     IVC diam: 1.70 cm RV S prime:     13.60 cm/s TAPSE (M-mode): 2.9 cm RVSP:           30.9 mmHg LEFT ATRIUM              Index        RIGHT ATRIUM           Index LA diam:        5.30 cm  2.83 cm/m   RA Pressure: 3.00 mmHg LA Vol (A2C):   152.0 ml 81.04 ml/m  RA Area:     20.40 cm LA Vol (A4C):   136.0 ml 72.51 ml/m  RA Volume:   58.40 ml  31.14 ml/m LA Biplane Vol: 144.0 ml 76.78 ml/m  AORTIC VALVE LVOT Vmax:   90.70 cm/s LVOT Vmean:  58.300 cm/s LVOT VTI:    0.182 m  AORTA Ao Root diam: 3.90 cm Ao Asc diam:  3.90 cm MITRAL VALVE                  TRICUSPID VALVE  TR Peak grad:   27.9 mmHg MV Decel Time: 162 msec       TR Vmax:        264.00 cm/s MR Peak grad:    160.8 mmHg   Estimated RAP:  3.00 mmHg MR Mean grad:    97.0 mmHg    RVSP:           30.9 mmHg MR Vmax:         634.00 cm/s MR Vmean:        461.0 cm/s   SHUNTS MR PISA:         1.57 cm     Systemic VTI:  0.18 m MR PISA Eff ROA: 8 mm        Systemic Diam: 2.50 cm MR PISA Radius:  0.50 cm MV E velocity: 104.00 cm/s MV A velocity: 88.80 cm/s MV E/A ratio:  1.17 Oswaldo Milian MD Electronically signed by Oswaldo Milian MD Signature Date/Time: 10/26/2022/1:26:05 PM    Final       IMPRESSION/PLAN: This visit was conducted via Telephone to spare the patient unnecessary potential exposure in the healthcare setting during the current COVID-19 pandemic. 1. 76 y.o. gentleman with Stage T2a adenocarcinoma of the prostate with Gleason Score of  4+3, and PSA of 6.6. We discussed the patient's workup and outlined the nature of prostate cancer in this setting. The patient's T stage, Gleason's score, and PSA put him into the unfavorable intermediate risk group. Accordingly, he is eligible for a variety of potential treatment options including prostatectomy or ST-ADT with either brachytherapy or  5.5 weeks of external radiation. We discussed the available radiation techniques, and focused on the details and logistics of delivery. We discussed and outlined the risks, benefits, short and long-term effects associated with radiotherapy and compared and contrasted these with prostatectomy. We discussed the role of SpaceOAR in reducing the rectal toxicity associated with radiotherapy. We also detailed the role of ADT in the treatment of unfavorable intermediate risk prostate cancer and outlined the associated side effects that could be expected with this therapy.  He was encouraged to ask questions that were answered to his stated satisfaction.  At the end of the conversation the patient is interested in moving forward with 5.5 concurrent with ST-ADT. He has not received his first ADT injection so we will share our discussion with Dr. Diona Fanti and make arrangements for a follow-up visit, first available, to start ADT now.  We will also help to coordinate for fiducial markers and SpaceOAR gel placement in February 2024, prior to simulation, to reduce rectal toxicity from radiotherapy. The patient appears to have a good understanding of his disease and our treatment recommendations which are of curative intent and is in agreement with the stated plan.  Therefore, we will move forward with treatment planning accordingly, in anticipation of beginning IMRT approximately 2 months after starting ADT.  We enjoyed meeting him and his wife today and look forward to continue to participate in his care.  Given current concerns for patient exposure during the COVID-19  pandemic, this encounter was conducted via telephone. The patient was notified in advance and was offered a MyChart meeting to allow for face to face communication but unfortunately reported that he did not have the appropriate resources/technology to support such a visit and instead preferred to proceed with telephone consult. The patient has given verbal consent for this type of encounter. The attendants for this meeting include Tyler Pita MD, Andi Devon, and patient Cristian Towry  Nunez and his wife, Mardene Celeste. During the encounter, Tyler Pita MD and Brar Caldron PA-C were located at [facility] Radiation Oncology Department.  Patient Cristian Nunez and his wife, Mardene Celeste, were located at home.   We personally spent 60 minutes in this encounter including chart review, reviewing radiological studies, meeting face-to-face with the patient, entering orders and completing documentation.   Nicholos Johns, PA-C    Tyler Pita, MD  [facility] Direct Dial: [phone]  Fax: [phone] [email or web address]  Skype  LinkedIn   This document serves as a record of services personally performed by Tyler Pita, MD and Manders Caldron, PA-C. It was created on their behalf by Wilburn Mylar, a trained medical scribe. The creation of this record is based on the scribe's personal observations and the provider's statements to them. This document has been checked and approved by the attending provider.

## 2022-11-12 ENCOUNTER — Telehealth: Payer: Self-pay

## 2022-11-12 ENCOUNTER — Other Ambulatory Visit: Payer: Self-pay

## 2022-11-12 ENCOUNTER — Telehealth: Payer: Self-pay | Admitting: *Deleted

## 2022-11-12 DIAGNOSIS — K5900 Constipation, unspecified: Secondary | ICD-10-CM

## 2022-11-12 DIAGNOSIS — C61 Malignant neoplasm of prostate: Secondary | ICD-10-CM | POA: Insufficient documentation

## 2022-11-12 DIAGNOSIS — K922 Gastrointestinal hemorrhage, unspecified: Secondary | ICD-10-CM

## 2022-11-12 NOTE — Telephone Encounter (Signed)
-----   Message from Vladimir Crofts, [place] sent at 11/08/2022 10:27 AM EST ----- Regarding: Please set up a follow up for this patient! Thanks! Patient of Dr. Carlean Purl, needs 4-6 week OV with Dr. Carlean Purl or APP ( me) for post hospital FU, diverticular bleed/constipation. Needs 2 weeks CBC. Thanks!

## 2022-11-12 NOTE — Telephone Encounter (Signed)
CALLED PATIENT TO INFORM OF APPT. FOR FU TO DISCUSS ADT ON 11-16-22- ARRIVAL TIME- 1:45 PM, SPOKE WITH PATIENT AND HE IS AWARE OF THIS APPT.

## 2022-11-12 NOTE — Telephone Encounter (Signed)
Pt was made aware of Vicie Mutters PA recommendations:  Orders for labs entered into Epic: Pt notified to come by the lab near 11/23/2022: Address Provided: Pt was scheduled for an office visit with Dr. Carlean Purl  on 12/26/2021 at 10:10 AM: Pt made aware: Pt verbalized understanding with all questions answered.

## 2022-11-14 DIAGNOSIS — Z191 Hormone sensitive malignancy status: Secondary | ICD-10-CM | POA: Diagnosis not present

## 2022-11-14 DIAGNOSIS — C61 Malignant neoplasm of prostate: Secondary | ICD-10-CM | POA: Diagnosis not present

## 2022-11-16 DIAGNOSIS — D2920 Benign neoplasm of unspecified testis: Secondary | ICD-10-CM | POA: Diagnosis not present

## 2022-11-16 DIAGNOSIS — C61 Malignant neoplasm of prostate: Secondary | ICD-10-CM | POA: Diagnosis not present

## 2022-11-19 NOTE — Telephone Encounter (Signed)
Inbound call from patient stating he has an OV with another doctor in [place] and he lives out of town and wanted to know if he could come tomorrow for his labs. He is aware that it was recommended he come around 12/15 but wanted to make sure a few days prior wouldn't hurt. Please advise.

## 2022-11-20 ENCOUNTER — Ambulatory Visit: Payer: Medicare Other | Attending: Cardiovascular Disease | Admitting: Cardiovascular Disease

## 2022-11-20 ENCOUNTER — Other Ambulatory Visit (INDEPENDENT_AMBULATORY_CARE_PROVIDER_SITE_OTHER): Payer: Medicare Other

## 2022-11-20 ENCOUNTER — Encounter: Payer: Self-pay | Admitting: Cardiovascular Disease

## 2022-11-20 VITALS — BP 130/80 | HR 81 | Ht 68.0 in | Wt 162.4 lb

## 2022-11-20 DIAGNOSIS — K5900 Constipation, unspecified: Secondary | ICD-10-CM

## 2022-11-20 DIAGNOSIS — I10 Essential (primary) hypertension: Secondary | ICD-10-CM | POA: Diagnosis not present

## 2022-11-20 DIAGNOSIS — K922 Gastrointestinal hemorrhage, unspecified: Secondary | ICD-10-CM | POA: Diagnosis not present

## 2022-11-20 DIAGNOSIS — E782 Mixed hyperlipidemia: Secondary | ICD-10-CM | POA: Insufficient documentation

## 2022-11-20 DIAGNOSIS — I340 Nonrheumatic mitral (valve) insufficiency: Secondary | ICD-10-CM | POA: Insufficient documentation

## 2022-11-20 LAB — CBC WITH DIFFERENTIAL/PLATELET
Basophils Absolute: 0 10*3/uL (ref 0.0–0.1)
Basophils Relative: 1 % (ref 0.0–3.0)
Eosinophils Absolute: 0.2 10*3/uL (ref 0.0–0.7)
Eosinophils Relative: 4.7 % (ref 0.0–5.0)
HCT: 30.1 % — ABNORMAL LOW (ref 39.0–52.0)
Hemoglobin: 10.2 g/dL — ABNORMAL LOW (ref 13.0–17.0)
Lymphocytes Relative: 29.2 % (ref 12.0–46.0)
Lymphs Abs: 1 10*3/uL (ref 0.7–4.0)
MCHC: 33.9 g/dL (ref 30.0–36.0)
MCV: 92 fl (ref 78.0–100.0)
Monocytes Absolute: 0.6 10*3/uL (ref 0.1–1.0)
Monocytes Relative: 18.9 % — ABNORMAL HIGH (ref 3.0–12.0)
Neutro Abs: 1.6 10*3/uL (ref 1.4–7.7)
Neutrophils Relative %: 46.2 % (ref 43.0–77.0)
Platelets: 256 10*3/uL (ref 150.0–400.0)
RBC: 3.28 Mil/uL — ABNORMAL LOW (ref 4.22–5.81)
RDW: 13.9 % (ref 11.5–15.5)
WBC: 3.4 10*3/uL — ABNORMAL LOW (ref 4.0–10.5)

## 2022-11-20 NOTE — Telephone Encounter (Signed)
Pt stated that he is wondering if he could come by and have his labs drawn today since he was from out of town and has another Dr. Network engineer today: Pt was notified that he could come on  and get his lab done today:  Pt verbalized understanding with all questions answered.

## 2022-11-20 NOTE — Progress Notes (Signed)
Cardiology Office Note:    Date:  11/23/2022   ID:  Cristian Nunez, DOB 1946/12/07, MRN [id number]  PCP:  Nicoletta Dress, MD   [place] Providers Cardiologist:  Sherren Mocha, MD     Referring MD: Renaldo Reel, [place]   Chief Complaint  Patient presents with   Mitral Regurgitation    History of Present Illness:    Cristian Nunez is a 76 y.o. male with a hx of primary mitral regurgitation, presenting for follow-up evaluation.  The patient is here with his wife today.  He was recently hospitalized with painless hematochezia and colonoscopy demonstrated diverticulosis with old blood but no active bleeding.  He was felt to have suffered a lower GI bleed secondary to diverticulosis.  His hemoglobin, which was 15.31-year ago, dropped all the way down to 6.9 and he required 1 unit of packed red blood cells with a repeat H&H improving at 9.1.  He was managed conservatively and had no further problems.  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  He is undergoing evaluation for this with consideration of radiation therapy.  He reports no change in cardiac symptoms.  He does have some fatigue and exertional dyspnea associated with his profound anemia.  Main issue now is just getting his energy back.  He has not had any chest pain, orthopnea, PND, or leg swelling.  The patient's recent echocardiogram from November 17 showed persistence of severe mitral regurgitation now with some reduction in his LV function with an LVEF of 50 to 55% compared to his previous study which demonstrated an LVEF of 60 to 65%.  Past Medical History:  Diagnosis Date   Abnormal EKG 12/09/2018   Arthritis    psoriatic   Esophageal stricture    Essential hypertension 12/09/2018   GERD (gastroesophageal reflux disease)    History of thyroid cancer    thyroid   Hyperlipidemia    Hypertension    Hypothyroidism    90% thyroidectomy 1973   Mitral  regurgitation 11/18/2019   Mitral valve prolapse    Mixed dyslipidemia 12/09/2018   Psoriatic arthritis ([place])    Raynaud's syndrome    Thyroid cancer [facility])     Past Surgical History:  Procedure Laterality Date   BALLOON DILATION N/A 04/12/2021   Procedure: BALLOON DILATION;  Surgeon: Gatha Mayer, MD;  Location: [facility] ENDOSCOPY;  Service: Endoscopy;  Laterality: N/A;   COLONOSCOPY     COLONOSCOPY WITH PROPOFOL N/A 11/07/2022   Procedure: COLONOSCOPY WITH PROPOFOL;  Surgeon: Daryel November, MD;  Location: [place];  Service: Gastroenterology;  Laterality: N/A;   ESOPHAGOGASTRODUODENOSCOPY (EGD) WITH PROPOFOL N/A 04/12/2021   Procedure: ESOPHAGOGASTRODUODENOSCOPY (EGD) WITH PROPOFOL;  Surgeon: Gatha Mayer, MD;  Location: [place];  Service: Endoscopy;  Laterality: N/A;  needs fluoro   INGUINAL HERNIA REPAIR Left 01/28/2019   Procedure: LEFT INGUINAL HERNIA REPAIR WITH MESH;  Surgeon: Rolm Bookbinder, MD;  Location: [place];  Service: General;  Laterality: Left;  GENERAL ANESTHESIA AND TAP BLOCK   KNEE ARTHROSCOPY Left    RIGHT/LEFT HEART CATH AND CORONARY ANGIOGRAPHY N/A 02/03/2021   Procedure: RIGHT/LEFT HEART CATH AND CORONARY ANGIOGRAPHY;  Surgeon: Sherren Mocha, MD;  Location: [facility];  Service: Cardiovascular;  Laterality: N/A;   TEE WITHOUT CARDIOVERSION N/A 04/12/2021   Procedure: TRANSESOPHAGEAL ECHOCARDIOGRAM (TEE);  Surgeon: Werner Lean, MD;  Location: [facility] ENDOSCOPY;  Service: Cardiovascular;  Laterality: N/A;   THYROIDECTOMY, PARTIAL  UMBILICAL HERNIA REPAIR     VARICOSE VEIN SURGERY Right     Current Medications: Current Meds  Medication Sig   acetaminophen (TYLENOL) 500 MG tablet Take 500 mg by mouth every 6 (six) hours as needed for moderate pain.   amLODipine (NORVASC) 5 MG tablet Take 5 mg by mouth daily.   amoxicillin (AMOXIL) 500 MG capsule Take 500 mg by mouth 3 (three) times daily as needed ("outbreaks around my mouth"). Pt  reports taking this prn for outbreaks and will usually take it tid for about 3 days every 3ish months   b complex vitamins tablet Take 1 tablet by mouth daily.   clobetasol ointment (TEMOVATE) 6.38 % Apply 1 application topically 2 (two) times daily as needed (irritation).   Ixekizumab (TALTZ) 80 MG/ML SOAJ Inject into the skin every 30 (thirty) days.   ketoconazole (NIZORAL) 2 % shampoo Apply 1 application topically every 30 (thirty) days.   levothyroxine (SYNTHROID) 50 MCG tablet Take 50 mcg by mouth every other day.   levothyroxine (SYNTHROID) 75 MCG tablet Take 75 mcg by mouth every other day.   methocarbamol (ROBAXIN) 500 MG tablet Take 500 mg by mouth every 12 (twelve) hours as needed for muscle pain. for pain   omeprazole (PRILOSEC) 20 MG capsule Take 20 mg by mouth daily.   rosuvastatin (CRESTOR) 10 MG tablet Take 10 mg by mouth daily.   triamcinolone cream (KENALOG) 0.1 % Apply 1 application  topically as needed (itching).     Allergies:   Patient has no known allergies.   Social History   Socioeconomic History   Marital status: Married    Spouse name: Not on file   Number of children: Not on file   Years of education: Not on file   Highest education level: Not on file  Occupational History   Occupation: retired    Comment: Pharmacist  Tobacco Use   Smoking status: Never   Smokeless tobacco: Never  Vaping Use   Vaping Use: Never used  Substance and Sexual Activity   Alcohol use: Yes    Alcohol/week: 14.0 - 21.0 standard drinks of alcohol    Types: 14 - 21 Cans of beer per week   Drug use: Never   Sexual activity: Not on file  Other Topics Concern   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 working for [employer] in [employer]   4 daughters 1 daughter Harland German is the Surveyor, quantity in charge of short stay at [employer] [facility]   Never smoker 2 beers a day no caffeine no drug use no  other tobacco   Social Determinants of Radio broadcast assistant Strain: Not on file  Food Insecurity: Not on file  Transportation Needs: Not on file  Physical Activity: Not on file  Stress: Not on file  Social Connections: Not on file     Family History: The patient's family history includes Arthritis in his mother; Crohn's disease in his sister; Heart Problems in his maternal grandmother; Heart attack in his maternal grandfather; Hypertension in his mother; Leukemia in his brother; Skin cancer in his sister; Stroke in his father.  ROS:   Please see the history of present illness.    All other systems reviewed and are negative.  EKGs/Labs/Other Studies Reviewed:    The following studies were reviewed today: Echo 10/26/2022:  1. Left ventricular ejection fraction, by estimation, is 50  to 55%. The  left ventricle has low normal function. The left ventricle demonstrates  global hypokinesis. The left ventricular internal cavity size was mildly  dilated. LVIDs 4.2 cm. There is  moderate asymmetric left ventricular hypertrophy of the basal-septal  segment. Left ventricular diastolic parameters are indeterminate.   2. Right ventricular systolic function is normal. The right ventricular  size is normal. There is normal pulmonary artery systolic pressure. The  estimated right ventricular systolic pressure is 68.3 mmHg.   3. Left atrial size was severely dilated.   4. 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 Severe mitral  valve regurgitation.   5. The aortic valve is tricuspid. Aortic valve regurgitation is not  visualized. No aortic stenosis is present.   6. Aortic dilatation noted. There is mild dilatation of the ascending  aorta, measuring 39 mm.   7. The inferior vena cava is normal in size with greater than 50%  respiratory variability, suggesting right atrial pressure of 3 mmHg.   Comparison(s): 04/24/22 EF 60-65%. Severe MR.   EKG:  EKG is not ordered today.     Recent Labs: 11/06/2022: ALT 24 11/07/2022: TSH 1.355 11/08/2022: BUN 12; Creatinine, Ser 0.73; Magnesium 1.7; Potassium 3.0; Sodium 138 11/20/2022: Hemoglobin 10.2; Platelets 256.0  Recent Lipid Panel    Component Value Date/Time   CHOL 145 05/20/2019 1017   TRIG 96 05/20/2019 1017   HDL 46 05/20/2019 1017   CHOLHDL 3.2 05/20/2019 1017   LDLCALC 80 05/20/2019 1017     Risk Assessment/Calculations:            Physical Exam:    VS:  BP 130/80   Pulse 81   Ht '5\' 8"'$  (1.727 m)   Wt 162 lb 6.4 oz (73.7 kg)   SpO2 99%   BMI 24.69 kg/m     Wt Readings from Last 3 Encounters:  11/20/22 162 lb 6.4 oz (73.7 kg)  11/09/22 162 lb (73.5 kg)  11/06/22 162 lb 7.7 oz (73.7 kg)     GEN:  Well nourished, well developed in no acute distress HEENT: Normal NECK: No JVD; No carotid bruits LYMPHATICS: No lymphadenopathy CARDIAC: RRR, 3/6 holosystolic murmur at the apex RESPIRATORY:  Clear to auscultation without rales, wheezing or rhonchi  ABDOMEN: Soft, non-tender, non-distended MUSCULOSKELETAL:  No edema; No deformity  SKIN: Warm and dry NEUROLOGIC:  Alert and oriented x 3 PSYCHIATRIC:  Normal affect   ASSESSMENT:    1. Nonrheumatic mitral valve regurgitation   2. Primary hypertension   3. Mixed hyperlipidemia    PLAN:    In order of problems listed above:  The patient has severe, primary mitral regurgitation with bileaflet prolapse, mixed symptoms potentially related to his mitral valve disorder but more likely related to his recent lower GI bleed with anemia at this point.  However, with his reduction in LV function, I am going to refer him for cardiac surgical evaluation.  I am not sure at this point if he would be considered a candidate for minimally invasive mitral valve repair.  The patient had a transesophageal echo in May 2022.  His major comorbidities include recent lower GI bleed, recent diagnosis of prostate cancer but presumably with a good prognosis, and  chronic psoriatic arthritis and kyphoscoliosis.  Other treatment options could include transcatheter edge-to-edge repair of the mitral valve or ongoing medical therapy if he is not a candidate for surgery. Blood pressure controlled on amlodipine. Treated with rosuvastatin.  LDL cholesterol 73  Medication Adjustments/Labs and Tests Ordered: Current medicines are reviewed at length with the patient today.  Concerns regarding medicines are outlined above.  No orders of the defined types were placed in this encounter.  No orders of the defined types were placed in this encounter.   Patient Instructions  Medication Instructions:  Your physician recommends that you continue on your current medications as directed. Please refer to the Current Medication list given to you today.  *If you need a refill on your cardiac medications before your next appointment, please call your pharmacy*   Lab Work: NONE If you have labs (blood work) drawn today and your tests are completely normal, you will receive your results only by: [place] (if you have MyChart) OR A paper copy in the mail If you have any lab test that is abnormal or we need to change your treatment, we will call you to review the results.   Testing/Procedures: Ambulatory Referral to TCTS (you will be contacted to schedule)   Follow-Up: At [facility], you and your health needs are our priority.  As part of our continuing mission to provide you with exceptional heart care, we have created designated Provider Care Teams.  These Care Teams include your primary Cardiologist (physician) and Advanced Practice Providers (APPs -  Physician Assistants and Nurse Practitioners) who all work together to provide you with the care you need, when you need it.  We recommend signing up for the patient portal called "MyChart".  Sign up information is provided on this After Visit Summary.  MyChart is used to connect with  patients for Virtual Visits (Telemedicine).  Patients are able to view lab/test results, encounter notes, upcoming appointments, etc.  Non-urgent messages can be sent to your provider as well.   To learn more about what you can do with MyChart, go to [email or web address].    Your next appointment:   6 month(s)  The format for your next appointment:   In Person  Provider:   Sherren Mocha, MD       Important Information About Sugar         Signed, Sherren Mocha, MD  11/23/2022 6:28 AM    [place]

## 2022-11-20 NOTE — Patient Instructions (Signed)
Medication Instructions:  Your physician recommends that you continue on your current medications as directed. Please refer to the Current Medication list given to you today.  *If you need a refill on your cardiac medications before your next appointment, please call your pharmacy*   Lab Work: NONE If you have labs (blood work) drawn today and your tests are completely normal, you will receive your results only by: [place] (if you have MyChart) OR A paper copy in the mail If you have any lab test that is abnormal or we need to change your treatment, we will call you to review the results.   Testing/Procedures: Ambulatory Referral to TCTS (you will be contacted to schedule)   Follow-Up: At [facility], you and your health needs are our priority.  As part of our continuing mission to provide you with exceptional heart care, we have created designated Provider Care Teams.  These Care Teams include your primary Cardiologist (physician) and Advanced Practice Providers (APPs -  Physician Assistants and Nurse Practitioners) who all work together to provide you with the care you need, when you need it.  We recommend signing up for the patient portal called "MyChart".  Sign up information is provided on this After Visit Summary.  MyChart is used to connect with patients for Virtual Visits (Telemedicine).  Patients are able to view lab/test results, encounter notes, upcoming appointments, etc.  Non-urgent messages can be sent to your provider as well.   To learn more about what you can do with MyChart, go to [email or web address].    Your next appointment:   6 month(s)  The format for your next appointment:   In Person  Provider:   Sherren Mocha, MD       Important Information About Sugar

## 2022-11-21 ENCOUNTER — Other Ambulatory Visit: Payer: Self-pay

## 2022-11-21 DIAGNOSIS — K5900 Constipation, unspecified: Secondary | ICD-10-CM

## 2022-11-21 DIAGNOSIS — K922 Gastrointestinal hemorrhage, unspecified: Secondary | ICD-10-CM

## 2022-11-22 ENCOUNTER — Encounter: Payer: Self-pay | Admitting: Urology

## 2022-11-22 DIAGNOSIS — L578 Other skin changes due to chronic exposure to nonionizing radiation: Secondary | ICD-10-CM | POA: Diagnosis not present

## 2022-11-22 DIAGNOSIS — L400 Psoriasis vulgaris: Secondary | ICD-10-CM | POA: Diagnosis not present

## 2022-11-22 DIAGNOSIS — L570 Actinic keratosis: Secondary | ICD-10-CM | POA: Diagnosis not present

## 2022-11-22 NOTE — Progress Notes (Signed)
Patient was started on Orgovyx early this week so we will proceed with coordinating for fiducial markers and SpaceOAR gel placment in Feb. 2024, in anticipation of beginning his treatments approximately 2 months from start of ADT.  Nicholos Johns, MMS, PA-C [place] at [place]: [phone]  Fax: [phone]

## 2022-11-27 ENCOUNTER — Telehealth: Payer: Self-pay | Admitting: *Deleted

## 2022-11-27 NOTE — Telephone Encounter (Signed)
CALLED PATIENT TO INFORM OF FID. MARKERS AND SPACE OAR PLACEMENT ON 01-15-23 AND HIS SIM ON 01-17-23- ARRIVAL TIME- 9:45 AM @ [facility], INFORMED PATIENT TO ARRIVE WITH A FULL BLADDER, SPOKE WITH PATIENT AND HE IS AWARE OF THESE APPTS. AND THE INSTRUCTIONS

## 2022-11-28 ENCOUNTER — Other Ambulatory Visit (INDEPENDENT_AMBULATORY_CARE_PROVIDER_SITE_OTHER): Payer: Medicare Other

## 2022-11-28 DIAGNOSIS — K5900 Constipation, unspecified: Secondary | ICD-10-CM | POA: Diagnosis not present

## 2022-11-28 DIAGNOSIS — K922 Gastrointestinal hemorrhage, unspecified: Secondary | ICD-10-CM

## 2022-11-28 LAB — IBC + FERRITIN
Ferritin: 22.3 ng/mL (ref 22.0–322.0)
Iron: 56 ug/dL (ref 42–165)
Saturation Ratios: 11.6 % — ABNORMAL LOW (ref 20.0–50.0)
TIBC: 483 ug/dL — ABNORMAL HIGH (ref 250.0–450.0)
Transferrin: 345 mg/dL (ref 212.0–360.0)

## 2022-12-12 ENCOUNTER — Other Ambulatory Visit: Payer: Self-pay | Admitting: Urology

## 2022-12-16 NOTE — Progress Notes (Unsigned)
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 He had a recent TTE which had ongoing severe MR without evidence of PHTN but did have the LV function decrease from above 60% on previous echos to now 55%. He has had a recent GI bleed from what if felt to be diverticular disease on colonoscopy and at that time his Hgb dropped to below 7 and he felt very fatigued and when he walked it felt like his legs were going to give out. He denies CP or lightheadedness or lower extremity edema. He reports that he occasionally has to take a deep breath. Since his blood count has improved he still feels fatigued but has not tried to do his daily walking. He also has been recently diagnosed with non metastatic prostate cancer, has been started on hormonal therapy and will start a 5 week radiation therapy course starting in February. Of note he has had a TEE last year and a heart cath 2 yrs ago and there was no significant CAD other than a 40% RCA stenosis      Past Medical History:  Diagnosis Date   Abnormal EKG 12/09/2018   Arthritis    psoriatic   Esophageal stricture    Essential hypertension 12/09/2018   GERD (gastroesophageal reflux disease)    History of thyroid cancer    thyroid   Hyperlipidemia    Hypertension    Hypothyroidism    90% thyroidectomy 1973   Mitral regurgitation 11/18/2019   Mitral valve prolapse    Mixed dyslipidemia 12/09/2018   Psoriatic arthritis ([place])    Raynaud's syndrome    Thyroid cancer ([place])     Past Surgical History:  Procedure Laterality Date    BALLOON DILATION N/A 04/12/2021   Procedure: BALLOON DILATION;  Surgeon: Gatha Mayer, MD;  Location: [facility];  Service: Endoscopy;  Laterality: N/A;   COLONOSCOPY     COLONOSCOPY WITH PROPOFOL N/A 11/07/2022   Procedure: COLONOSCOPY WITH PROPOFOL;  Surgeon: Daryel November, MD;  Location: [facility];  Service: Gastroenterology;  Laterality: N/A;   ESOPHAGOGASTRODUODENOSCOPY (EGD) WITH PROPOFOL N/A 04/12/2021   Procedure: ESOPHAGOGASTRODUODENOSCOPY (EGD) WITH PROPOFOL;  Surgeon: Gatha Mayer, MD;  Location: [facility];  Service: Endoscopy;  Laterality: N/A;  needs fluoro   INGUINAL HERNIA REPAIR Left 01/28/2019   Procedure: LEFT INGUINAL HERNIA REPAIR WITH MESH;  Surgeon: Rolm Bookbinder, MD;  Location: [facility];  Service: General;  Laterality: Left;  GENERAL ANESTHESIA AND TAP BLOCK   KNEE ARTHROSCOPY Left    RIGHT/LEFT HEART CATH AND CORONARY ANGIOGRAPHY N/A 02/03/2021   Procedure: RIGHT/LEFT HEART CATH AND CORONARY ANGIOGRAPHY;  Surgeon: Sherren Mocha, MD;  Location: [facility];  Service: Cardiovascular;  Laterality: N/A;   TEE WITHOUT CARDIOVERSION N/A 04/12/2021   Procedure: TRANSESOPHAGEAL ECHOCARDIOGRAM (TEE);  Surgeon: Werner Lean, MD;  Location: [facility];  Service: Cardiovascular;  Laterality: N/A;   THYROIDECTOMY, PARTIAL  UMBILICAL HERNIA REPAIR     VARICOSE VEIN SURGERY Right     Social History   Tobacco Use  Smoking Status Never  Smokeless Tobacco Never    Social History   Substance and Sexual Activity  Alcohol Use Yes   Alcohol/week: 14.0 - 21.0 standard drinks of alcohol   Types: 14 - 21 Cans of beer per week    Social History   Socioeconomic History   Marital status: Married    Spouse name: Not on file   Number of children: Not on file   Years of education: Not on file   Highest education level: Not on file  Occupational History   Occupation: retired    Comment: Pharmacist  Tobacco Use   Smoking status: Never    Smokeless tobacco: Never  Vaping Use   Vaping Use: Never used  Substance and Sexual Activity   Alcohol use: Yes    Alcohol/week: 14.0 - 21.0 standard drinks of alcohol    Types: 14 - 21 Cans of beer per week   Drug use: Never   Sexual activity: Not on file  Other Topics Concern   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 working for [employer] in [employer]   4 daughters 1 daughter Harland German is the Surveyor, quantity in charge of short stay at [employer]   Never smoker 2 beers a day no caffeine no drug use no other tobacco   Social Determinants of Radio broadcast assistant Strain: Not on file  Food Insecurity: Not on file  Transportation Needs: Not on file  Physical Activity: Not on file  Stress: Not on file  Social Connections: Not on file  Intimate Partner Violence: Not on file    No Known Allergies  Current Outpatient Medications  Medication Sig Dispense Refill   acetaminophen (TYLENOL) 500 MG tablet Take 500 mg by mouth every 6 (six) hours as needed for moderate pain.     amLODipine (NORVASC) 5 MG tablet Take 5 mg by mouth daily.     amoxicillin (AMOXIL) 500 MG capsule Take 500 mg by mouth 3 (three) times daily as needed ("outbreaks around my mouth"). Pt reports taking this prn for outbreaks and will usually take it tid for about 3 days every 3ish months     b complex vitamins tablet Take 1 tablet by mouth daily.     clobetasol ointment (TEMOVATE) 1.02 % Apply 1 application topically 2 (two) times daily as needed (irritation).     Ixekizumab (TALTZ) 80 MG/ML SOAJ Inject into the skin every 30 (thirty) days.     ketoconazole (NIZORAL) 2 % shampoo Apply 1 application topically every 30 (thirty) days.     levothyroxine (SYNTHROID) 50 MCG tablet Take 50 mcg by mouth every other day.     levothyroxine (SYNTHROID) 75 MCG tablet Take 75 mcg by mouth every other day.     methocarbamol (ROBAXIN)  500 MG tablet Take 500 mg by mouth every 12 (twelve) hours as needed for muscle pain. for pain     omeprazole (PRILOSEC) 20 MG capsule Take 20 mg by mouth daily.     rosuvastatin (CRESTOR) 10 MG tablet Take 10 mg by mouth daily.     triamcinolone cream (KENALOG) 0.1 % Apply 1 application  topically as needed (itching).     No current facility-administered medications for this visit.     Family History  Problem Relation Age of Onset   Hypertension Mother    Arthritis Mother    Heart Problems Maternal Grandmother    Stroke Father    Crohn's disease Sister    Skin cancer Sister    Leukemia Brother    Heart attack Maternal Grandfather        Physical Exam: Lungs: clear Cardiac: RR with extra systoles and 3/6 sem radiating to axillae Ext: no edema Neuro: alert and oriented without focal deficits     Diagnostic Studies & Laboratory data: I have personally reviewed the following studies and agree with the findings  TTE (10/2022)  IMPRESSIONS   1. Left ventricular ejection fraction, by estimation, is 50 to 55%. The  left ventricle has low normal function. The left ventricle demonstrates  global hypokinesis. The left ventricular internal cavity size was mildly  dilated. LVIDs 4.2 cm. There is  moderate asymmetric left ventricular hypertrophy of the basal-septal  segment. Left ventricular diastolic parameters are indeterminate.   2. Right ventricular systolic function is normal. The right ventricular  size is normal. There is normal pulmonary artery systolic pressure. The  estimated right ventricular systolic pressure is 78.5 mmHg.   3. Left atrial size was severely dilated.   4. 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 Severe mitral  valve regurgitation.   5. The aortic valve is tricuspid. Aortic valve regurgitation is not  visualized. No aortic stenosis is present.   6. Aortic dilatation noted. There is mild dilatation of the ascending  aorta, measuring 39 mm.    7. The inferior vena cava is normal in size with greater than 50%  respiratory variability, suggesting right atrial pressure of 3 mmHg.   Comparison(s): 04/24/22 EF 60-65%. Severe MR.   FINDINGS   Left Ventricle: Left ventricular ejection fraction, by estimation, is 50  to 55%. The left ventricle has low normal function. The left ventricle  demonstrates global hypokinesis. The left ventricular internal cavity size  was mildly dilated. There is  moderate asymmetric left ventricular hypertrophy of the basal-septal  segment. Left ventricular diastolic parameters are indeterminate.   Right Ventricle: The right ventricular size is normal. No increase in  right ventricular wall thickness. Right ventricular systolic function is  normal. There is normal pulmonary artery systolic pressure. The tricuspid  regurgitant velocity is 2.64 m/s, and   with an assumed right atrial pressure of 3 mmHg, the estimated right  ventricular systolic pressure is 88.5 mmHg.   Left Atrium: Left atrial size was severely dilated.   Right Atrium: Right atrial size was normal in size.   Pericardium: There is no evidence of pericardial effusion.   Mitral Valve: The mitral valve is myxomatous. Severe mitral valve  regurgitation.   Tricuspid Valve: The tricuspid valve is normal in structure. Tricuspid  valve regurgitation is trivial.   Aortic Valve: The aortic valve is tricuspid. Aortic valve regurgitation is  not visualized. No aortic stenosis is present.   Pulmonic Valve: The pulmonic valve was not well visualized. Pulmonic valve  regurgitation is trivial.   Aorta: The aortic root is normal in size and structure and aortic  dilatation noted. There is mild dilatation of the ascending aorta,  measuring 39 mm.   Venous: The inferior vena cava is normal in size with greater than 50%  respiratory variability, suggesting right atrial pressure of 3 mmHg.   IAS/Shunts: The interatrial septum was not well  visualized.     LEFT VENTRICLE  PLAX 2D  LVIDd:  5.70 cm   Diastology  LVIDs:         4.20 cm   LV e' medial:    8.08 cm/s  LV PW:         1.20 cm   LV E/e' medial:  12.9  LV IVS:        1.00 cm   LV e' lateral:   5.92 cm/s  LVOT diam:     2.50 cm   LV E/e' lateral: 17.6  LV SV:         89  LV SV Index:   48        2D Longitudinal Strain  LVOT Area:     4.91 cm  2D Strain GLS (A2C):   -9.5 %                           2D Strain GLS (A3C):   -16.9 %                           2D Strain GLS (A4C):   -12.0 %                           2D Strain GLS Avg:     -12.8 %                             3D Volume EF:                           3D EF:        48 %                           LV EDV:       132 ml                           LV ESV:       69 ml                           LV SV:        63 ml   RIGHT VENTRICLE             IVC  RV Basal diam:  3.60 cm     IVC diam: 1.70 cm  RV S prime:     13.60 cm/s  TAPSE (M-mode): 2.9 cm  RVSP:           30.9 mmHg   LEFT ATRIUM              Index        RIGHT ATRIUM           Index  LA diam:        5.30 cm  2.83 cm/m   RA Pressure: 3.00 mmHg  LA Vol (A2C):   152.0 ml 81.04 ml/m  RA Area:     20.40 cm  LA Vol (A4C):   136.0 ml 72.51 ml/m  RA Volume:   58.40 ml  31.14 ml/m  LA Biplane Vol: 144.0 ml 76.78 ml/m   AORTIC VALVE  LVOT Vmax:   90.70 cm/s  LVOT Vmean:  58.300 cm/s  LVOT VTI:  0.182 m    AORTA  Ao Root diam: 3.90 cm  Ao Asc diam:  3.90 cm   MITRAL VALVE                  TRICUSPID VALVE                                TR Peak grad:   27.9 mmHg  MV Decel Time: 162 msec       TR Vmax:        264.00 cm/s  MR Peak grad:    160.8 mmHg   Estimated RAP:  3.00 mmHg  MR Mean grad:    97.0 mmHg    RVSP:           30.9 mmHg  MR Vmax:         634.00 cm/s  MR Vmean:        461.0 cm/s   SHUNTS  MR PISA:         1.57 cm     Systemic VTI:  0.18 m  MR PISA Eff ROA: 8 mm        Systemic Diam: 2.50 cm  MR PISA Radius:  0.50 cm  MV E  velocity: 104.00 cm/s  MV A velocity: 88.80 cm/s  MV E/A ratio:  1.17    Recent Radiology Findings:   No results found.    Recent Lab Findings: Lab Results  Component Value Date   WBC 3.4 (L) 11/20/2022   HGB 10.2 (L) 11/20/2022   HCT 30.1 (L) 11/20/2022   PLT 256.0 11/20/2022   GLUCOSE 115 (H) 11/08/2022   CHOL 145 05/20/2019   TRIG 96 05/20/2019   HDL 46 05/20/2019   LDLCALC 80 05/20/2019   ALT 24 11/06/2022   AST 26 11/06/2022   NA 138 11/08/2022   K 3.0 (L) 11/08/2022   CL 103 11/08/2022   CREATININE 0.73 11/08/2022   BUN 12 11/08/2022   CO2 26 11/08/2022   TSH 1.355 11/07/2022   INR 1.1 11/06/2022      Assessment / Plan:     77 yo wm with severe MR from primary Barlows pathology and a recent decrease in his LV function to 50-55%. He appears to not be symptomatic but difficult to determine from his recent GI bleed and upcoming RT therapy for his prostate cancer. We had a very good discussion on the current issues with his MR and concern that his LV function decrease should have [place] consider intervention. We also discussed the complexity involved with repair of Barlow's pathology and the options for mini valve approach vs sternotomy. He understands that moving forward a repeat cath and CT of his chest would be needed to determine operative approach and that mini approach with his age and degree of complexity of the repair may favor sternotomy approach. With is upcomming RT therapy and recent GI bleed, moving forward we will continue medical therapy for his prostate cancer and after that to begin work up with cath and ct scan and evaluate if ongoing medical therapy, surgical mv surgery or consideration for mitral clip to be determined then. He will monitor his symptoms closely over the next few months and if DOE worsens, he will be evaluated sooner.    I have spent  60 min in review of the records, viewing studies and in face to face with patient and in coordination of future  care  Coralie Common 12/16/2022 1:10 PM

## 2022-12-17 ENCOUNTER — Encounter: Payer: Self-pay | Admitting: Thoracic Surgery (Cardiothoracic Vascular Surgery)

## 2022-12-17 ENCOUNTER — Institutional Professional Consult (permissible substitution) (INDEPENDENT_AMBULATORY_CARE_PROVIDER_SITE_OTHER): Payer: Medicare Other | Admitting: Thoracic Surgery (Cardiothoracic Vascular Surgery)

## 2022-12-17 VITALS — BP 164/75 | HR 86 | Resp 20 | Ht 68.0 in | Wt 162.0 lb

## 2022-12-17 DIAGNOSIS — I051 Rheumatic mitral insufficiency: Secondary | ICD-10-CM | POA: Diagnosis not present

## 2022-12-17 DIAGNOSIS — Z23 Encounter for immunization: Secondary | ICD-10-CM | POA: Diagnosis not present

## 2022-12-17 NOTE — Patient Instructions (Signed)
Will reevaluate in 4-6 months following RT therapy for consideration for surgical intervention for MR

## 2022-12-18 ENCOUNTER — Other Ambulatory Visit: Payer: Self-pay | Admitting: Urology

## 2022-12-18 DIAGNOSIS — D2920 Benign neoplasm of unspecified testis: Secondary | ICD-10-CM

## 2022-12-19 DIAGNOSIS — Z961 Presence of intraocular lens: Secondary | ICD-10-CM | POA: Diagnosis not present

## 2022-12-19 DIAGNOSIS — H43393 Other vitreous opacities, bilateral: Secondary | ICD-10-CM | POA: Diagnosis not present

## 2022-12-19 DIAGNOSIS — H26492 Other secondary cataract, left eye: Secondary | ICD-10-CM | POA: Diagnosis not present

## 2022-12-19 DIAGNOSIS — H524 Presbyopia: Secondary | ICD-10-CM | POA: Diagnosis not present

## 2022-12-19 DIAGNOSIS — H3562 Retinal hemorrhage, left eye: Secondary | ICD-10-CM | POA: Diagnosis not present

## 2022-12-26 ENCOUNTER — Ambulatory Visit (INDEPENDENT_AMBULATORY_CARE_PROVIDER_SITE_OTHER): Payer: Medicare Other | Admitting: Internal Medicine

## 2022-12-26 ENCOUNTER — Other Ambulatory Visit (INDEPENDENT_AMBULATORY_CARE_PROVIDER_SITE_OTHER): Payer: Medicare Other

## 2022-12-26 ENCOUNTER — Encounter: Payer: Self-pay | Admitting: Internal Medicine

## 2022-12-26 VITALS — BP 124/78 | HR 83 | Ht 68.0 in | Wt 161.0 lb

## 2022-12-26 DIAGNOSIS — D62 Acute posthemorrhagic anemia: Secondary | ICD-10-CM

## 2022-12-26 DIAGNOSIS — K5731 Diverticulosis of large intestine without perforation or abscess with bleeding: Secondary | ICD-10-CM

## 2022-12-26 LAB — CBC WITH DIFFERENTIAL/PLATELET
Basophils Absolute: 0.1 10*3/uL (ref 0.0–0.1)
Basophils Relative: 1.3 % (ref 0.0–3.0)
Eosinophils Absolute: 0.2 10*3/uL (ref 0.0–0.7)
Eosinophils Relative: 5.1 % — ABNORMAL HIGH (ref 0.0–5.0)
HCT: 37.8 % — ABNORMAL LOW (ref 39.0–52.0)
Hemoglobin: 13 g/dL (ref 13.0–17.0)
Lymphocytes Relative: 31.5 % (ref 12.0–46.0)
Lymphs Abs: 1.3 10*3/uL (ref 0.7–4.0)
MCHC: 34.5 g/dL (ref 30.0–36.0)
MCV: 86.7 fl (ref 78.0–100.0)
Monocytes Absolute: 0.6 10*3/uL (ref 0.1–1.0)
Monocytes Relative: 14.4 % — ABNORMAL HIGH (ref 3.0–12.0)
Neutro Abs: 1.9 10*3/uL (ref 1.4–7.7)
Neutrophils Relative %: 47.7 % (ref 43.0–77.0)
Platelets: 215 10*3/uL (ref 150.0–400.0)
RBC: 4.36 Mil/uL (ref 4.22–5.81)
RDW: 13.8 % (ref 11.5–15.5)
WBC: 4.1 10*3/uL (ref 4.0–10.5)

## 2022-12-26 NOTE — Patient Instructions (Signed)

## 2022-12-26 NOTE — Progress Notes (Signed)
Cristian Nunez 77 y.o. 21-Aug-1946 [id number]  Assessment & Plan:   Encounter Diagnoses  Name Primary?   Diverticulosis of colon with hemorrhage Yes   Acute blood loss anemia    Patient has recovered from his diverticular bleed.  CBC was rechecked today results below.  He will follow-up with primary care and GI as needed.  He had some constipation issues that are successfully treated with the Benefiber and some MiraLAX he will continue that.  Would remain off aspirin as a preventative agent as risk-benefit ratio does not favor benefit in elderly patients due to stroke risk and bleeding risks otherwise.  Should he need aspirin or other antiplatelets or anticoagulants after valve replacement that is fine.   I do think he would benefit from iron supplementation and I recommend he take ferrous sulfate for 3 months we will pass this on to him when we tell him about his hemoglobin. 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  Colonoscopy by Dr. Candis Schatz on 11/07/2022 demonstrated severe diverticulosis without active hemorrhage at the time but the conclusion was he was having a diverticular hemorrhage which stopped spontaneously.  Hemoglobin nadir was 6.9 on November 30 he was transfused his discharge hemoglobin was 9.1, hemoglobin on December 12 10.2, and ferritin was 22.  He is not taking iron at this time. No Known Allergies Current Meds  Medication Sig   acetaminophen (TYLENOL) 500 MG tablet Take 500 mg by mouth every 6 (six) hours as needed for moderate pain.   amLODipine (NORVASC) 5 MG tablet Take 5 mg by  mouth daily.   amoxicillin (AMOXIL) 500 MG capsule Take 500 mg by mouth 3 (three) times daily as needed ("outbreaks around my mouth"). Pt reports taking this prn for outbreaks and will usually take it tid for about 3 days every 3ish months   clobetasol ointment (TEMOVATE) 1.09 % Apply 1 application topically 2 (two) times daily as needed (irritation).   ketoconazole (NIZORAL) 2 % shampoo Apply 1 application topically every 30 (thirty) days.   levothyroxine (SYNTHROID) 50 MCG tablet Take 50 mcg by mouth every other day.   levothyroxine (SYNTHROID) 75 MCG tablet Take 75 mcg by mouth every other day.   methocarbamol (ROBAXIN) 500 MG tablet Take 500 mg by mouth every 12 (twelve) hours as needed for muscle pain. for pain   omeprazole (PRILOSEC) 20 MG capsule Take 20 mg by mouth daily.   rosuvastatin (CRESTOR) 10 MG tablet Take 10 mg by mouth daily.   triamcinolone cream (KENALOG) 0.1 % Apply 1 application  topically as needed (itching).   Past Medical History:  Diagnosis Date   Abnormal EKG 12/09/2018   Arthritis    psoriatic   Esophageal stricture    Essential hypertension 12/09/2018   GERD (gastroesophageal reflux disease)    GI bleed    History of thyroid cancer    thyroid   Hyperlipidemia    Hypertension    Hypothyroidism    90% thyroidectomy 1973   Mitral regurgitation 11/18/2019   Mitral valve prolapse    Mixed dyslipidemia 12/09/2018  Psoriatic arthritis ([place])    Raynaud's syndrome    Thyroid cancer [facility])    Past Surgical History:  Procedure Laterality Date   BALLOON DILATION N/A 04/12/2021   Procedure: BALLOON DILATION;  Surgeon: Gatha Mayer, MD;  Location: [place];  Service: Endoscopy;  Laterality: N/A;   COLONOSCOPY     COLONOSCOPY WITH PROPOFOL N/A 11/07/2022   Procedure: COLONOSCOPY WITH PROPOFOL;  Surgeon: Daryel November, MD;  Location: [place];  Service: Gastroenterology;  Laterality: N/A;   ESOPHAGOGASTRODUODENOSCOPY (EGD) WITH PROPOFOL N/A 04/12/2021    Procedure: ESOPHAGOGASTRODUODENOSCOPY (EGD) WITH PROPOFOL;  Surgeon: Gatha Mayer, MD;  Location: [place] [facility];  Service: Endoscopy;  Laterality: N/A;  needs fluoro   INGUINAL HERNIA REPAIR Left 01/28/2019   Procedure: LEFT INGUINAL HERNIA REPAIR WITH MESH;  Surgeon: Rolm Bookbinder, MD;  Location: [place];  Service: General;  Laterality: Left;  GENERAL ANESTHESIA AND TAP BLOCK   KNEE ARTHROSCOPY Left    RIGHT/LEFT HEART CATH AND CORONARY ANGIOGRAPHY N/A 02/03/2021   Procedure: RIGHT/LEFT HEART CATH AND CORONARY ANGIOGRAPHY;  Surgeon: Sherren Mocha, MD;  Location: [place] [facility];  Service: Cardiovascular;  Laterality: N/A;   TEE WITHOUT CARDIOVERSION N/A 04/12/2021   Procedure: TRANSESOPHAGEAL ECHOCARDIOGRAM (TEE);  Surgeon: Werner Lean, MD;  [facility] ENDOS[facility];  Service: Cardiovascular;  Laterality: N/A;   THYROIDECTOMY, PARTIAL     UMBILICAL HERNIA REPAIR     VARICOSE VEIN SURGERY Right    Social History   Social History Narrative   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   Review of Systems As per HPI.  Objective:   Physical Exam BP 124/78   Pulse 83   Ht '5\' 8"'$  (1.727 m)   Wt 161 lb (73 kg)   BMI 24.48 kg/m

## 2023-01-02 DIAGNOSIS — E039 Hypothyroidism, unspecified: Secondary | ICD-10-CM | POA: Diagnosis not present

## 2023-01-02 DIAGNOSIS — I10 Essential (primary) hypertension: Secondary | ICD-10-CM | POA: Diagnosis not present

## 2023-01-02 DIAGNOSIS — K219 Gastro-esophageal reflux disease without esophagitis: Secondary | ICD-10-CM | POA: Diagnosis not present

## 2023-01-02 DIAGNOSIS — D62 Acute posthemorrhagic anemia: Secondary | ICD-10-CM | POA: Diagnosis not present

## 2023-01-02 DIAGNOSIS — I340 Nonrheumatic mitral (valve) insufficiency: Secondary | ICD-10-CM | POA: Diagnosis not present

## 2023-01-02 DIAGNOSIS — C61 Malignant neoplasm of prostate: Secondary | ICD-10-CM | POA: Diagnosis not present

## 2023-01-02 DIAGNOSIS — E785 Hyperlipidemia, unspecified: Secondary | ICD-10-CM | POA: Diagnosis not present

## 2023-01-08 ENCOUNTER — Encounter (HOSPITAL_BASED_OUTPATIENT_CLINIC_OR_DEPARTMENT_OTHER): Payer: Self-pay | Admitting: Urology

## 2023-01-08 NOTE — Progress Notes (Signed)
Spoke w/ via phone for pre-op interview--- pt Lab needs dos----   [employer] results------ current in EKG in epic/ chart COVID test -----patient states asymptomatic no test needed Arrive at ------- 0630 on 01-14-2023 NPO after MN NO Solid Food.  Clear liquids from MN until--- 0530 Med rec completed Medications to take morning of surgery ----- norvasc, crestor, prilosec, synthroid Diabetic medication ----- n/a Patient instructed no nail polish to be worn day of surgery Patient instructed to bring photo id and insurance card day of surgery Patient aware to have Driver (ride ) / caregiver    for 24 hours after surgery -- wife, patricia Patient Special Instructions ----- will do one fleet enema night before surgery Pre-Op special Istructions ----- n/a Patient verbalized understanding of instructions that were given at this phone interview. Patient denies shortness of breath, chest pain, fever, cough at this phone interview.   Anesthesia Review: HTN;  severe MR , nonrheumatic;  hx thyroid cancer 1973 secondary to neck radiation 1950s no further treatment for thyroid other than total thyroidectomy;  Psoriasis arthritis;  hx lower gi bleed 11/ 2023 diverticular bleed resolved   PCP:  Dr Delena Bali Cardiologist : Dr Burt Knack [facility] 11-20-2022) CVTS:  surgical consult w/ Dr Lajean Silvius ([place] 12-17-2022) GI:  Dr Carlean Purl  (lov 12-26-2022) Chest x-ray : 06-28-2020 EKG : 11-06-2022 Echo : 10-26-2022 Stress test: stress echo 08-17-2020 Cardiac Cath :  02-03-2021 Activity level:  denies sob w/ normal activity Sleep Study/ CPAP :  no Blood Thinner/ Instructions /Last Dose: no ASA / Instructions/ Last Dose : no

## 2023-01-11 ENCOUNTER — Ambulatory Visit
Admission: RE | Admit: 2023-01-11 | Discharge: 2023-01-11 | Disposition: A | Discharge status: Home / Self Care | Payer: Medicare Other | Source: Ambulatory Visit | Attending: Urology | Admitting: Urology

## 2023-01-11 DIAGNOSIS — D2920 Benign neoplasm of unspecified testis: Secondary | ICD-10-CM

## 2023-01-11 MED ORDER — GADOPICLENOL 0.5 MMOL/ML IV SOLN
8.0000 mL | Freq: Once | INTRAVENOUS | Status: AC | PRN
  Administered 2023-01-11: 8 mL via INTRAVENOUS

## 2023-01-13 NOTE — H&P (Signed)
H&P  Chief Complaint: Prostate cancer, Rt testicular mass  History of Present Illness: Cristian Nunez presents at this time for Rt inguinal orchiectomy as well as placement of fiducial markers /SpaceOAR. He has high volume GG 3 prostate cancer and has been started on ADT in advance of EBRT. Additionally, he has a cystic Rt testicular mass with contrast enhancement of an 11 mm associated nodule possibly representing a neoplasm.  Past Medical History:  Diagnosis Date   Benign neoplasm of right testis    Chronic venous insufficiency    Edema of both lower extremities    Essential hypertension    cardiac cath 02-03-2021  diffuse disease normal lvf, RCA 40%   GERD (gastroesophageal reflux disease)    Hiatal hernia    History of lower GI bleeding 11/06/2022   admission in epic;  pt LOC,  Hg 6.9  dx diverticular bleed that spontenously resovled,  transfused 1 unit PRBC   History of thyroid cancer 1973   s/p  total thyroidectomy ;  per pt told cancer caused by neck radiation 1950s to prevent tonsill's/ adenoid's from growing back (no further treatment for thyroid cancer)   Hyperlipidemia, mixed    Hypertension    Hypothyroidism, postsurgical 1973   followed by pcp;     s/p total thyroidecomy for thyroid cancer secondary to neck radiation 1950s   Kyphoscoliosis    Malignant neoplasm prostate ([place]) 09/2022   urologist--- dr Shayla Heming/  radiation oncologist--- dr Tammi Klippel:  dx 10/ 2023,  Gleason 4+3   Mitral valve prolapse    w/ severe MR   Mixed dyslipidemia 12/09/2018   Nocturia    Non-rheumatic mitral regurgitation 11/18/2019   cardiologist--- dr Burt Knack;   severe MR w/ prolapse,  last echo 10-26-2022  ef 50-55% mean grandiant 97.0 mmHg,   OA (osteoarthritis)    Psoriasis    Psoriatic arthritis ([place])    followed by dermotologist--- dr Michele Mcalpine   Raynaud's syndrome    S/P balloon dilatation of esophageal stricture 04/12/2021   post radiation scarring    Past Surgical History:  Procedure Laterality  Date   BALLOON DILATION N/A 04/12/2021   Procedure: BALLOON DILATION;  Surgeon: Gatha Mayer, MD;  [facility] ENDOSCOPY;  Service: Endoscopy;  Laterality: N/A;   CATARACT EXTRACTION W/ INTRAOCULAR LENS IMPLANT Bilateral 2015   COLONOSCOPY     COLONOSCOPY WITH PROPOFOL N/A 11/07/2022   Procedure: COLONOSCOPY WITH PROPOFOL;  Surgeon: Daryel November, MD;  Location: [place];  Service: Gastroenterology;  Laterality: N/A;   ESOPHAGOGASTRODUODENOSCOPY (EGD) WITH PROPOFOL N/A 04/12/2021   Procedure: ESOPHAGOGASTRODUODENOSCOPY (EGD) WITH PROPOFOL;  Surgeon: Gatha Mayer, MD;  Location: [place];  Service: Endoscopy;  Laterality: N/A;  needs fluoro   INGUINAL HERNIA REPAIR Left 01/28/2019   Procedure: LEFT INGUINAL HERNIA REPAIR WITH MESH;  Surgeon: Rolm Bookbinder, MD;  Location: [place];  Service: General;  Laterality: Left;  GENERAL ANESTHESIA AND TAP BLOCK   KNEE ARTHROSCOPY Left 1983   RIGHT/LEFT HEART CATH AND CORONARY ANGIOGRAPHY N/A 02/03/2021   Procedure: RIGHT/LEFT HEART CATH AND CORONARY ANGIOGRAPHY;  Surgeon: Sherren Mocha, MD;  Location: [place] CV LAB;  Service: Cardiovascular;  Laterality: N/A;   TEE WITHOUT CARDIOVERSION N/A 04/12/2021   Procedure: TRANSESOPHAGEAL ECHOCARDIOGRAM (TEE);  Surgeon: Werner Lean, MD;  Location: [facility] ENDOSCOPY;  Service: Cardiovascular;  Laterality: N/A;   TONSILLECTOMY     1950s   [place]   per pt april 1973 unilateral lobectomy and july 1973 total residual thyroid tissue  removed   UMBILICAL HERNIA REPAIR  2005   AND VARICOSE VEIN SURGERY RIGHT LEG    Home Medications:  Allergies as of 01/13/2023   No Known Allergies      Medication List      Notice   Cannot display discharge medications because the patient has not yet been admitted.     Allergies: No Known Allergies  Family History  Problem Relation Age of Onset   Hypertension Mother    Arthritis Mother    Heart Problems Maternal  Grandmother    Stroke Father    Crohn's disease Sister    Skin cancer Sister    Leukemia Brother    Heart attack Maternal Grandfather     Social History:  reports that he has never smoked. He has never used smokeless tobacco. He reports current alcohol use of about 14.0 - 21.0 standard drinks of alcohol per week. He reports that he does not use drugs.  ROS: A complete review of systems was performed.  All systems are negative except for pertinent findings as noted.  Physical Exam:  Vital signs in last 24 hours: Ht '5\' 8"'$  (1.727 m)   Wt 72.6 kg   BMI 24.33 kg/m  Constitutional:  Alert and oriented, No acute distress Cardiovascular: Regular rate  Respiratory: Normal respiratory effort Psychiatric: Normal mood and affect  I have reviewed prior pt notes  I have reviewed notes from referring/previous physicians--Dr Manning's notes  I have independently reviewed prior imaging--PSMA PET scan, MRI images  I have reviewed prior PSA/pathoilogy results     Impression/Assessment:  Cystic mass of Rt testicle  2. GG 3 prostate cancer--awaiting EBRT  Plan:  Rt inguinal orchiectomy, placement of fiducial markers/SPACEOAR

## 2023-01-14 ENCOUNTER — Other Ambulatory Visit: Payer: Self-pay

## 2023-01-14 ENCOUNTER — Ambulatory Visit (HOSPITAL_BASED_OUTPATIENT_CLINIC_OR_DEPARTMENT_OTHER)
Admission: RE | Admit: 2023-01-14 | Discharge: 2023-01-14 | Disposition: A | Discharge status: Home / Self Care | Payer: Medicare Other | Attending: Urology | Admitting: Urology

## 2023-01-14 ENCOUNTER — Ambulatory Visit (HOSPITAL_BASED_OUTPATIENT_CLINIC_OR_DEPARTMENT_OTHER): Payer: Medicare Other | Admitting: Anesthesiology

## 2023-01-14 ENCOUNTER — Encounter (HOSPITAL_BASED_OUTPATIENT_CLINIC_OR_DEPARTMENT_OTHER): Payer: Self-pay | Admitting: Urology

## 2023-01-14 ENCOUNTER — Encounter (HOSPITAL_BASED_OUTPATIENT_CLINIC_OR_DEPARTMENT_OTHER)
Admission: RE | Disposition: A | Discharge status: Home / Self Care | Payer: Self-pay | Source: Home / Self Care | Attending: Urology

## 2023-01-14 DIAGNOSIS — I340 Nonrheumatic mitral (valve) insufficiency: Secondary | ICD-10-CM | POA: Diagnosis not present

## 2023-01-14 DIAGNOSIS — D2921 Benign neoplasm of right testis: Secondary | ICD-10-CM | POA: Diagnosis not present

## 2023-01-14 DIAGNOSIS — E039 Hypothyroidism, unspecified: Secondary | ICD-10-CM

## 2023-01-14 DIAGNOSIS — I10 Essential (primary) hypertension: Secondary | ICD-10-CM | POA: Insufficient documentation

## 2023-01-14 DIAGNOSIS — C61 Malignant neoplasm of prostate: Secondary | ICD-10-CM

## 2023-01-14 DIAGNOSIS — C6291 Malignant neoplasm of right testis, unspecified whether descended or undescended: Secondary | ICD-10-CM | POA: Diagnosis not present

## 2023-01-14 DIAGNOSIS — N448 Other noninflammatory disorders of the testis: Secondary | ICD-10-CM | POA: Diagnosis not present

## 2023-01-14 DIAGNOSIS — D2920 Benign neoplasm of unspecified testis: Secondary | ICD-10-CM | POA: Diagnosis not present

## 2023-01-14 DIAGNOSIS — Z01818 Encounter for other preprocedural examination: Secondary | ICD-10-CM

## 2023-01-14 DIAGNOSIS — C7982 Secondary malignant neoplasm of genital organs: Secondary | ICD-10-CM | POA: Diagnosis not present

## 2023-01-14 DIAGNOSIS — N500 Atrophy of testis: Secondary | ICD-10-CM | POA: Diagnosis not present

## 2023-01-14 DIAGNOSIS — N5089 Other specified disorders of the male genital organs: Secondary | ICD-10-CM | POA: Diagnosis not present

## 2023-01-14 HISTORY — DX: Benign neoplasm of right testis: D29.21

## 2023-01-14 HISTORY — DX: Nocturia: R35.1

## 2023-01-14 HISTORY — PX: TESTICLE BIOPSY: SHX471

## 2023-01-14 HISTORY — DX: Venous insufficiency (chronic) (peripheral): I87.2

## 2023-01-14 HISTORY — PX: ORCHIECTOMY: SHX2116

## 2023-01-14 HISTORY — PX: GOLD SEED IMPLANT: SHX6343

## 2023-01-14 HISTORY — DX: Scoliosis, unspecified: M41.9

## 2023-01-14 HISTORY — DX: Psoriasis, unspecified: L40.9

## 2023-01-14 HISTORY — DX: Diaphragmatic hernia without obstruction or gangrene: K44.9

## 2023-01-14 HISTORY — DX: Localized edema: R60.0

## 2023-01-14 HISTORY — PX: SPACE OAR INSTILLATION: SHX6769

## 2023-01-14 HISTORY — DX: Mixed hyperlipidemia: E78.2

## 2023-01-14 HISTORY — PX: SCROTAL EXPLORATION: SHX2386

## 2023-01-14 HISTORY — DX: Unspecified osteoarthritis, unspecified site: M19.90

## 2023-01-14 LAB — POCT I-STAT, CHEM 8
BUN: 14 mg/dL (ref 8–23)
Calcium, Ion: 1.17 mmol/L (ref 1.15–1.40)
Chloride: 97 mmol/L — ABNORMAL LOW (ref 98–111)
Creatinine, Ser: 0.8 mg/dL (ref 0.61–1.24)
Glucose, Bld: 100 mg/dL — ABNORMAL HIGH (ref 70–99)
HCT: 43 % (ref 39.0–52.0)
Hemoglobin: 14.6 g/dL (ref 13.0–17.0)
Potassium: 3.8 mmol/L (ref 3.5–5.1)
Sodium: 135 mmol/L (ref 135–145)
TCO2: 27 mmol/L (ref 22–32)

## 2023-01-14 SURGERY — INSERTION, GOLD SEEDS
Anesthesia: General | Site: Scrotum | Laterality: Right

## 2023-01-14 MED ORDER — PROPOFOL 10 MG/ML IV BOLUS
INTRAVENOUS | Status: DC | PRN
  Administered 2023-01-14: 160 mg via INTRAVENOUS

## 2023-01-14 MED ORDER — GLYCOPYRROLATE 0.2 MG/ML IJ SOLN
INTRAMUSCULAR | Status: DC | PRN
  Administered 2023-01-14 (×2): .1 mg via INTRAVENOUS

## 2023-01-14 MED ORDER — SODIUM CHLORIDE (PF) 0.9 % IJ SOLN
INTRAMUSCULAR | Status: DC | PRN
  Administered 2023-01-14: 10 mL

## 2023-01-14 MED ORDER — CEFAZOLIN SODIUM-DEXTROSE 2-4 GM/100ML-% IV SOLN
2.0000 g | INTRAVENOUS | Status: AC
  Administered 2023-01-14: 2 g via INTRAVENOUS

## 2023-01-14 MED ORDER — 0.9 % SODIUM CHLORIDE (POUR BTL) OPTIME
TOPICAL | Status: DC | PRN
  Administered 2023-01-14: 500 mL

## 2023-01-14 MED ORDER — FENTANYL CITRATE (PF) 100 MCG/2ML IJ SOLN
INTRAMUSCULAR | Status: AC
  Filled 2023-01-14: qty 2

## 2023-01-14 MED ORDER — FENTANYL CITRATE (PF) 250 MCG/5ML IJ SOLN
INTRAMUSCULAR | Status: DC | PRN
  Administered 2023-01-14: 25 ug via INTRAVENOUS
  Administered 2023-01-14: 50 ug via INTRAVENOUS

## 2023-01-14 MED ORDER — ACETAMINOPHEN 10 MG/ML IV SOLN
INTRAVENOUS | Status: AC
  Filled 2023-01-14: qty 100

## 2023-01-14 MED ORDER — BUPIVACAINE HCL (PF) 0.25 % IJ SOLN
INTRAMUSCULAR | Status: DC | PRN
  Administered 2023-01-14: 9 mL

## 2023-01-14 MED ORDER — FENTANYL CITRATE (PF) 100 MCG/2ML IJ SOLN
25.0000 ug | INTRAMUSCULAR | Status: DC | PRN
  Administered 2023-01-14: 25 ug via INTRAVENOUS

## 2023-01-14 MED ORDER — EPHEDRINE SULFATE-NACL 50-0.9 MG/10ML-% IV SOSY
PREFILLED_SYRINGE | INTRAVENOUS | Status: DC | PRN
  Administered 2023-01-14 (×2): 5 mg via INTRAVENOUS

## 2023-01-14 MED ORDER — LIDOCAINE 2% (20 MG/ML) 5 ML SYRINGE
INTRAMUSCULAR | Status: DC | PRN
  Administered 2023-01-14: 60 mg via INTRAVENOUS

## 2023-01-14 MED ORDER — LACTATED RINGERS IV SOLN
INTRAVENOUS | Status: DC
  Administered 2023-01-14: 1000 mL via INTRAVENOUS

## 2023-01-14 MED ORDER — PHENYLEPHRINE 80 MCG/ML (10ML) SYRINGE FOR IV PUSH (FOR BLOOD PRESSURE SUPPORT)
PREFILLED_SYRINGE | INTRAVENOUS | Status: DC | PRN
  Administered 2023-01-14: 40 ug via INTRAVENOUS
  Administered 2023-01-14: 80 ug via INTRAVENOUS
  Administered 2023-01-14 (×2): 40 ug via INTRAVENOUS

## 2023-01-14 MED ORDER — ACETAMINOPHEN 10 MG/ML IV SOLN
INTRAVENOUS | Status: DC | PRN
  Administered 2023-01-14: 1000 mg via INTRAVENOUS

## 2023-01-14 MED ORDER — ONDANSETRON HCL 4 MG/2ML IJ SOLN
INTRAMUSCULAR | Status: DC | PRN
  Administered 2023-01-14: 4 mg via INTRAVENOUS

## 2023-01-14 MED ORDER — AMISULPRIDE (ANTIEMETIC) 5 MG/2ML IV SOLN: 10.0000 mg | Freq: Once | INTRAVENOUS | Status: DC | PRN

## 2023-01-14 MED ORDER — ACETAMINOPHEN 500 MG PO TABS: 1000.0000 mg | ORAL_TABLET | Freq: Once | ORAL | Status: DC

## 2023-01-14 MED ORDER — FLEET ENEMA 7-19 GM/118ML RE ENEM: 1.0000 | ENEMA | Freq: Once | RECTAL | Status: DC

## 2023-01-14 MED ORDER — CEFAZOLIN SODIUM-DEXTROSE 2-4 GM/100ML-% IV SOLN
INTRAVENOUS | Status: AC
  Filled 2023-01-14: qty 100

## 2023-01-14 MED ORDER — DEXAMETHASONE SODIUM PHOSPHATE 10 MG/ML IJ SOLN
INTRAMUSCULAR | Status: DC | PRN
  Administered 2023-01-14: 4 mg via INTRAVENOUS

## 2023-01-14 SURGICAL SUPPLY — 80 items
ADH SKN CLS APL DERMABOND .7 (GAUZE/BANDAGES/DRESSINGS) ×3
APL SKNCLS STERI-STRIP NONHPOA (GAUZE/BANDAGES/DRESSINGS)
APL SWBSTK 6 STRL LF DISP (MISCELLANEOUS)
APPLICATOR COTTON TIP 6 STRL (MISCELLANEOUS) IMPLANT
APPLICATOR COTTON TIP 6IN STRL (MISCELLANEOUS)
BENZOIN TINCTURE PRP APPL 2/3 (GAUZE/BANDAGES/DRESSINGS) IMPLANT
BLADE CLIPPER SENSICLIP SURGIC (BLADE) ×3 IMPLANT
BLADE SURG 15 STRL LF DISP TIS (BLADE) ×3 IMPLANT
BLADE SURG 15 STRL SS (BLADE) ×3
BNDG GAUZE DERMACEA FLUFF 4 (GAUZE/BANDAGES/DRESSINGS) ×3 IMPLANT
BNDG GZE DERMACEA 4 6PLY (GAUZE/BANDAGES/DRESSINGS) ×3
CLEANER CAUTERY TIP PAD (MISCELLANEOUS) ×3 IMPLANT
CLOTH BEACON ORANGE TIMEOUT ST (SAFETY) ×3 IMPLANT
CNTNR URN SCR LID CUP LEK RST (MISCELLANEOUS) ×3 IMPLANT
CONT SPEC 4OZ STRL OR WHT (MISCELLANEOUS)
COVER BACK TABLE 60X90IN (DRAPES) ×3 IMPLANT
COVER MAYO STAND STRL (DRAPES) ×3 IMPLANT
DERMABOND ADVANCED .7 DNX12 (GAUZE/BANDAGES/DRESSINGS) ×3 IMPLANT
DISSECTOR ROUND CHERRY 3/8 STR (MISCELLANEOUS) IMPLANT
DRAIN PENROSE 0.25X18 (DRAIN) IMPLANT
DRAPE HYSTEROSCOPY (MISCELLANEOUS) IMPLANT
DRAPE LAPAROTOMY 100X72 PEDS (DRAPES) ×3 IMPLANT
DRAPE SHEET LG 3/4 BI-LAMINATE (DRAPES) IMPLANT
DRSG TEGADERM 4X4.75 (GAUZE/BANDAGES/DRESSINGS) ×3 IMPLANT
DRSG TEGADERM 8X12 (GAUZE/BANDAGES/DRESSINGS) ×3 IMPLANT
ELECT NDL TIP 2.8 STRL (NEEDLE) ×3 IMPLANT
ELECT NEEDLE TIP 2.8 STRL (NEEDLE) IMPLANT
ELECT REM PT RETURN 9FT ADLT (ELECTROSURGICAL) ×3
ELECTRODE REM PT RTRN 9FT ADLT (ELECTROSURGICAL) ×3 IMPLANT
GAUZE 4X4 16PLY ~~LOC~~+RFID DBL (SPONGE) ×3 IMPLANT
GAUZE SPONGE 4X4 12PLY STRL (GAUZE/BANDAGES/DRESSINGS) ×6 IMPLANT
GLOVE BIO SURGEON STRL SZ8 (GLOVE) ×3 IMPLANT
GLOVE ECLIPSE 8.0 STRL XLNG CF (GLOVE) ×3 IMPLANT
GLOVE SURG ORTHO 8.5 STRL (GLOVE) ×3 IMPLANT
GOWN STRL REUS W/TWL LRG LVL3 (GOWN DISPOSABLE) IMPLANT
GOWN STRL REUS W/TWL XL LVL3 (GOWN DISPOSABLE) ×9 IMPLANT
IMPL SPACEOAR VUE SYSTEM (Spacer) ×3 IMPLANT
IMPLANT SPACEOAR VUE SYSTEM (Spacer) ×3 IMPLANT
KIT TURNOVER CYSTO (KITS) ×3 IMPLANT
LEGGING LITHOTOMY PAIR STRL (DRAPES) IMPLANT
MANIFOLD NEPTUNE II (INSTRUMENTS) IMPLANT
MARKER GOLD PRELOAD 1.2X3 (Urological Implant) ×3 IMPLANT
MARKER SKIN DUAL TIP RULER LAB (MISCELLANEOUS) ×3 IMPLANT
NDL HYPO 25X1 1.5 SAFETY (NEEDLE) ×3 IMPLANT
NDL SPNL 22GX3.5 QUINCKE BK (NEEDLE) ×3 IMPLANT
NEEDLE HYPO 22GX1.5 SAFETY (NEEDLE) IMPLANT
NEEDLE HYPO 25X1 1.5 SAFETY (NEEDLE) IMPLANT
NEEDLE SPNL 22GX3.5 QUINCKE BK (NEEDLE) IMPLANT
NS IRRIG 500ML POUR BTL (IV SOLUTION) ×3 IMPLANT
PACK BASIN DAY SURGERY FS (CUSTOM PROCEDURE TRAY) ×3 IMPLANT
PENCIL SMOKE EVACUATOR (MISCELLANEOUS) ×3 IMPLANT
SEED GOLD PRELOAD 1.2X3 (Urological Implant) ×3 IMPLANT
SET COLLECT BLD 21X.75 12 PB G (NEEDLE) IMPLANT
SHEATH ULTRASOUND LF (SHEATH) IMPLANT
SHEATH ULTRASOUND LTX NONSTRL (SHEATH) IMPLANT
SLEEVE SURGEON STRL (DRAPES) IMPLANT
STRIP CLOSURE SKIN 1/2X4 (GAUZE/BANDAGES/DRESSINGS) IMPLANT
SUPPORT SCROTAL LG STRP (MISCELLANEOUS) ×3 IMPLANT
SUPPORT SCROTAL MED ADLT STRP (MISCELLANEOUS) IMPLANT
SURGILUBE 2OZ TUBE FLIPTOP (MISCELLANEOUS) ×3 IMPLANT
SUT CHROMIC 2 0 SH (SUTURE) IMPLANT
SUT CHROMIC 3 0 SH 27 (SUTURE) IMPLANT
SUT CHROMIC 4 0 SH 27 (SUTURE) IMPLANT
SUT MNCRL AB 4-0 PS2 18 (SUTURE) ×3 IMPLANT
SUT PROLENE 4 0 RB 1 (SUTURE)
SUT PROLENE 4-0 RB1 .5 CRCL 36 (SUTURE) IMPLANT
SUT SILK 0 TIES 10X30 (SUTURE) ×3 IMPLANT
SUT VIC AB 3-0 SH 27 (SUTURE) ×3
SUT VIC AB 3-0 SH 27X BRD (SUTURE) ×3 IMPLANT
SUT VIC AB 4-0 SH 27 (SUTURE)
SUT VIC AB 4-0 SH 27XANBCTRL (SUTURE) IMPLANT
SYR 10ML LL (SYRINGE) IMPLANT
SYR BULB IRRIG 60ML STRL (SYRINGE) ×3 IMPLANT
SYR CONTROL 10ML LL (SYRINGE) ×3 IMPLANT
TOWEL OR 17X26 10 PK STRL BLUE (TOWEL DISPOSABLE) ×6 IMPLANT
TRAY DSU PREP LF (CUSTOM PROCEDURE TRAY) ×3 IMPLANT
TUBE CONNECTING 12X1/4 (SUCTIONS) ×3 IMPLANT
UNDERPAD 30X36 HEAVY ABSORB (UNDERPADS AND DIAPERS) ×3 IMPLANT
WATER STERILE IRR 500ML POUR (IV SOLUTION) IMPLANT
YANKAUER SUCT BULB TIP NO VENT (SUCTIONS) ×3 IMPLANT

## 2023-01-14 NOTE — Interval H&P Note (Signed)
History and Physical Interval Note:  01/14/2023 8:23 AM  Cristian Nunez  has presented today for surgery, with the diagnosis of PROSTATE CANCER BENING NEOPLASMA OF THE TESTES.  The various methods of treatment have been discussed with the patient and family. After consideration of risks, benefits and other options for treatment, the patient has consented to  Procedure(s) with comments: GOLD SEED IMPLANT (N/A) - 30 MINS SPACE OAR INSTILLATION (N/A) INGUINAL EXPLORATION (N/A) POSSIBLE ORCHIECTOMY (Right) BIOPSY TESTICULAR (Right) as a surgical intervention.  The patient's history has been reviewed, patient examined, no change in status, stable for surgery.  I have reviewed the patient's chart and labs.  Questions were answered to the patient's satisfaction.     Lillette Boxer Zeniya Lapidus

## 2023-01-14 NOTE — Anesthesia Procedure Notes (Signed)
Procedure Name: LMA Insertion Date/Time: 01/14/2023 8:53 AM  Performed by: Clearnce Sorrel, CRNAPre-anesthesia Checklist: Patient identified, Emergency Drugs available, Suction available and Patient being monitored Patient Re-evaluated:Patient Re-evaluated prior to induction Oxygen Delivery Method: Circle System Utilized Preoxygenation: Pre-oxygenation with 100% oxygen Induction Type: IV induction Ventilation: Mask ventilation without difficulty LMA: LMA inserted LMA Size: 4.0 Number of attempts: 1 Airway Equipment and Method: Bite block Placement Confirmation: positive ETCO2 Tube secured with: Tape Dental Injury: Teeth and Oropharynx as per pre-operative assessment

## 2023-01-14 NOTE — Anesthesia Preprocedure Evaluation (Signed)
Anesthesia Evaluation  Patient identified by MRN, date of birth, ID band Patient awake    Reviewed: Allergy & Precautions, NPO status , Patient's Chart, lab work & pertinent test results  Airway Mallampati: II  TM Distance: >3 FB Neck ROM: Full    Dental  (+) Dental Advisory Given   Pulmonary neg pulmonary ROS   breath sounds clear to auscultation       Cardiovascular hypertension, Pt. on medications + Valvular Problems/Murmurs MR  Rhythm:Regular Rate:Normal     Neuro/Psych  Neuromuscular disease    GI/Hepatic Neg liver ROS, hiatal hernia,GERD  ,,  Endo/Other  Hypothyroidism    Renal/GU negative Renal ROS     Musculoskeletal  (+) Arthritis ,    Abdominal   Peds  Hematology negative hematology ROS (+)   Anesthesia Other Findings   Reproductive/Obstetrics                             Anesthesia Physical Anesthesia Plan  ASA: 4  Anesthesia Plan: General   Post-op Pain Management: Tylenol PO (pre-op)*   Induction: Intravenous  PONV Risk Score and Plan: 2 and Dexamethasone, Ondansetron and Treatment may vary due to age or medical condition  Airway Management Planned: Oral ETT and LMA  Additional Equipment:   Intra-op Plan:   Post-operative Plan: Extubation in OR  Informed Consent: I have reviewed the patients History and Physical, chart, labs and discussed the procedure including the risks, benefits and alternatives for the proposed anesthesia with the patient or authorized representative who has indicated his/her understanding and acceptance.     Dental advisory given  Plan Discussed with: CRNA  Anesthesia Plan Comments:        Anesthesia Quick Evaluation

## 2023-01-14 NOTE — Op Note (Signed)
Preoperative diagnosis: Grade group 3 prostate cancer, right testicular mass  Postoperative diagnosis: Same  Principal procedure: Right inguinal orchiectomy, placement of SpaceOAR and fiducial markers  Surgeon: Denard Tuminello  Anesthesia: General with EMA  Estimated blood loss: Less than 5 mL  Specimen: Right testicle and cord, to pathology  Drains: None  Complications: None  Indications: 77 year old male with grade group 3 prostate cancer.  He is on short-term androgen deprivation therapy and will be beginning external beam radiotherapy within the next week.  He was found, on initial visit, to have a 2 cm right testicular mass.  Ultrasound did reveal a nodularity within this cystic structure, MRI last week also revealed a 10 mm enhancing lesion within the cyst which was 20 mm in size.  I recommended right orchiectomy as primary management of this fairly abnormal cystic mass.  In addition, the patient will be having placement of fiducial markers and SpaceOAR in advance of his radiation.  He accepts the procedure, risk and complications.  Description of procedure: The patient was properly identified and marked in the holding area.  He was taken to the operating room where general anesthetic was administered.  He was then placed in the modified lithotomy position.  Genitalia perineum and right lower quadrant were prepped and draped.  Proper timeout was performed.  2 cm incision was made in the right inguinal area overlying the external inguinal ring.  Dissection carried down using blunt dissection.  The spermatic cord was identified.  It was followed down and the testicle was delivered from the incision.  Gubernaculum was incised.  Dissection was carried down proximally on the spermatic cord.  Once the cord entry into the external ring was seen, the cord was clamped just above this.  It was doubly ligated with 0 silk ties.  The stump was infiltrated with approximately 5 cc of quarter percent plain  Marcaine.  The other Marcaine was utilized to infiltrate the wound edges.  The ligatures around the cord stump were inspected and there was adequate hemostasis.  I inverted the scrotum through the wound and 1 small bleeder was electrocoagulated.  Otherwise it was hemostatic.  The cord and testicle were sent for permanent specimen.  Subcutaneous tissue was reapproximated using a running 2-0 Vicryl placed in a simple fashion.  Skin edges were reapproximated using a 4-0 Monocryl placed in a running subcuticular fashion.  Dermabond was placed on the wound.  At this point, placement of fiducial markers and SpaceOAR commenced.  Transrectal ultrasound probe was placed.  The prostate was scanned both in transverse and sagittal planes.  Gold fiducial markers were then placed, 2 in the right prostate, one at the base and one at the apex, as well as one in the left prostate.  These were placed through the perineum. I then proceeded with placement of SpaceOAR by introducing a needle with the bevel angled inferiorly approximately 2 cm superior to the anus. This was angled downward and under direct ultrasound was placed within the space between the prostatic capsule and rectum. This was confirmed with a small amount of sterile saline injected and this was performed under direct ultrasound. I then attached the SpaceOAR to the needle and injected this in the space between the prostate and rectum with good placement noted.  The patient was brought to recovery room in stable condition, having tolerated the procedure well.

## 2023-01-14 NOTE — Transfer of Care (Signed)
Immediate Anesthesia Transfer of Care Note  Patient: Cristian Nunez  Procedure(s) Performed: GOLD SEED IMPLANT (Prostate) SPACE OAR INSTILLATION (Prostate) INGUINAL EXPLORATION (Scrotum) ORCHIECTOMY (Right: Scrotum) BIOPSY TESTICULAR (Right: Scrotum)  Patient Location: PACU  Anesthesia Type:General  Level of Consciousness: drowsy and patient cooperative  Airway & Oxygen Therapy: Patient Spontanous Breathing  Post-op Assessment: Report given to RN and Post -op Vital signs reviewed and stable  Post vital signs: Reviewed and stable  Last Vitals:  Vitals Value Taken Time  BP 106/65 01/14/23 0950  Temp 36.4 C 01/14/23 0950  Pulse 56 01/14/23 0952  Resp 13 01/14/23 0952  SpO2 99 % 01/14/23 0952  Vitals shown include unvalidated device data.  Last Pain:  Vitals:   01/14/23 0657  PainSc: 0-No pain      Patients Stated Pain Goal: 8 (73/08/56 [id number])  Complications: No notable events documented.

## 2023-01-14 NOTE — Discharge Instructions (Addendum)
HOME CARE INSTRUCTIONS FOR SCROTAL PROCEDURES  Wound Care & Hygiene: You may apply an ice bag to the scrotum for the first 24 hours.  This may help decrease swelling and soreness.  You may have a dressing held in place by an athletic supporter.  You may remove the dressing in 24 hours and shower in 48 hours.  Continue to use the athletic supporter or tight briefs for at least a week. Activity: Rest today - not necessarily flat bed rest.  Just take it easy.  You should not do strenuous activities until your follow-up visit with your doctor.  You may resume light activity in 48 hours.  Return to Work:  Your doctor will advise you of this depending on the type of work you do  Diet: Drink liquids or eat a light diet this evening.  You may resume a regular diet tomorrow.  General Expectations: You may have a small amount of bleeding.  The scrotum may be swollen or bruised for about a week.  Call your Doctor if these occur:  -persistent or heavy bleeding  -temperature of 101 degrees or more  -severe pain, not relieved by your pain medication    Post Anesthesia Home Care Instructions  Activity: Get plenty of rest for the remainder of the day. A responsible individual must stay with you for 24 hours following the procedure.  For the next 24 hours, DO NOT: -Drive a car -Paediatric nurse -Drink alcoholic beverages -Take any medication unless instructed by your physician -Make any legal decisions or sign important papers.  Meals: Start with liquid foods such as gelatin or soup. Progress to regular foods as tolerated. Avoid greasy, spicy, heavy foods. If nausea and/or vomiting occur, drink only clear liquids until the nausea and/or vomiting subsides. Call your physician if vomiting continues.  Special Instructions/Symptoms: Your throat may feel dry or sore from the anesthesia or the breathing tube placed in your throat during surgery. If this causes discomfort, gargle with warm salt  water. The discomfort should disappear within 24 hours.

## 2023-01-15 ENCOUNTER — Telehealth: Payer: Self-pay | Admitting: *Deleted

## 2023-01-15 ENCOUNTER — Encounter (HOSPITAL_BASED_OUTPATIENT_CLINIC_OR_DEPARTMENT_OTHER): Payer: Self-pay | Admitting: Urology

## 2023-01-15 NOTE — Anesthesia Postprocedure Evaluation (Signed)
Anesthesia Post Note  Patient: Cristian Nunez  Procedure(s) Performed: GOLD SEED IMPLANT (Prostate) SPACE OAR INSTILLATION (Prostate) INGUINAL EXPLORATION (Scrotum) ORCHIECTOMY (Right: Scrotum) BIOPSY TESTICULAR (Right: Scrotum)     Patient location during evaluation: PACU Anesthesia Type: General Level of consciousness: awake and alert Pain management: pain level controlled Vital Signs Assessment: post-procedure vital signs reviewed and stable Respiratory status: spontaneous breathing, nonlabored ventilation, respiratory function stable and patient connected to nasal cannula oxygen Cardiovascular status: blood pressure returned to baseline and stable Postop Assessment: no apparent nausea or vomiting Anesthetic complications: no   No notable events documented.  Last Vitals:  Vitals:   01/14/23 1015 01/14/23 1130  BP: 122/68 (!) 142/84  Pulse: 62 60  Resp: 15 17  Temp:  36.9 C  SpO2: 100% 100%    Last Pain:  Vitals:   01/14/23 1130  PainSc: 3                  Tiajuana Amass

## 2023-01-15 NOTE — Telephone Encounter (Signed)
CALLED PATIENT TO REMIND OF SIM APPT. FOR 01-17-23- ARRIVAL TIME- 9:45 AM @ [facility], INFORMED PATIENT TO ARRIVE WITH A FULL BLADDER, SPOKE WITH PATIENT AND HE IS AWARE OF THIS APPT. AND THE INSTRUCTIONS

## 2023-01-16 NOTE — Progress Notes (Signed)
  Radiation Oncology         [phone] ________________________________  Name: Cristian Nunez MRN: [id number]  Date: 01/17/2023  DOB: 1945/12/19  SIMULATION AND TREATMENT PLANNING NOTE    ICD-10-CM   1. Malignant neoplasm of prostate ([place])  C61       DIAGNOSIS:  77 y.o. gentleman with Stage T2a adenocarcinoma of the prostate with Gleason score of 4+3, and PSA of 6.6.   NARRATIVE:  The patient was brought to the [place].  Identity was confirmed.  All relevant records and images related to the planned course of therapy were reviewed.  The patient freely provided informed written consent to proceed with treatment after reviewing the details related to the planned course of therapy. The consent form was witnessed and verified by the simulation staff.  Then, the patient was set-up in a stable reproducible supine position for radiation therapy.  A vacuum lock pillow device was custom fabricated to position his legs in a reproducible immobilized position.  Then, supervised the performance of a urethrogram under sterile conditions to identify the prostatic apex.  CT images were obtained.  Surface markings were placed.  The CT images were loaded into the planning software.  Then the prostate target and avoidance structures including the rectum, bladder, bowel and hips were contoured.  Treatment planning then occurred.  The radiation prescription was entered and confirmed.  A total of one complex treatment devices was fabricated. I have requested : Intensity Modulated Radiotherapy (IMRT) is medically necessary for this case for the following reason:  Rectal sparing.  I have requested daily cone beam CT volumetric image gudiance to track gold fiducial posiitoning along with bladder and rectal filling, this is medically necessary to assure accurate positioning of high dose radiation.  PLAN:  The patient will receive 70 Gy in 28 fractions.  ________________________________  Sheral Apley  Tammi Klippel, M.D.

## 2023-01-17 ENCOUNTER — Ambulatory Visit
Admission: RE | Admit: 2023-01-17 | Discharge: 2023-01-17 | Disposition: A | Discharge status: Home / Self Care | Payer: Medicare Other | Source: Ambulatory Visit | Attending: Radiation Oncology | Admitting: Radiation Oncology

## 2023-01-17 ENCOUNTER — Other Ambulatory Visit: Payer: Self-pay

## 2023-01-17 DIAGNOSIS — Z510 Encounter for antineoplastic radiation therapy: Secondary | ICD-10-CM | POA: Insufficient documentation

## 2023-01-17 DIAGNOSIS — C61 Malignant neoplasm of prostate: Secondary | ICD-10-CM | POA: Diagnosis not present

## 2023-01-17 DIAGNOSIS — Z191 Hormone sensitive malignancy status: Secondary | ICD-10-CM | POA: Diagnosis not present

## 2023-01-17 LAB — SURGICAL PATHOLOGY

## 2023-01-18 ENCOUNTER — Other Ambulatory Visit: Payer: Medicare Other

## 2023-01-21 DIAGNOSIS — Z191 Hormone sensitive malignancy status: Secondary | ICD-10-CM | POA: Diagnosis not present

## 2023-01-21 DIAGNOSIS — Z510 Encounter for antineoplastic radiation therapy: Secondary | ICD-10-CM | POA: Diagnosis not present

## 2023-01-21 DIAGNOSIS — C61 Malignant neoplasm of prostate: Secondary | ICD-10-CM | POA: Diagnosis not present

## 2023-01-30 ENCOUNTER — Ambulatory Visit
Admission: RE | Admit: 2023-01-30 | Discharge: 2023-01-30 | Disposition: A | Discharge status: Home / Self Care | Payer: Medicare Other | Source: Ambulatory Visit | Attending: Radiation Oncology | Admitting: Radiation Oncology

## 2023-01-30 ENCOUNTER — Other Ambulatory Visit: Payer: Self-pay

## 2023-01-30 DIAGNOSIS — Z191 Hormone sensitive malignancy status: Secondary | ICD-10-CM | POA: Diagnosis not present

## 2023-01-30 DIAGNOSIS — C61 Malignant neoplasm of prostate: Secondary | ICD-10-CM | POA: Diagnosis not present

## 2023-01-30 DIAGNOSIS — Z510 Encounter for antineoplastic radiation therapy: Secondary | ICD-10-CM | POA: Diagnosis not present

## 2023-01-30 LAB — RAD ONC ARIA SESSION SUMMARY
Course Elapsed Days: 0
Plan Fractions Treated to Date: 1
Plan Prescribed Dose Per Fraction: 2.5 Gy
Plan Total Fractions Prescribed: 28
Plan Total Prescribed Dose: 70 Gy
Reference Point Dosage Given to Date: 2.5 Gy
Reference Point Session Dosage Given: 2.5 Gy
Session Number: 1

## 2023-01-31 ENCOUNTER — Ambulatory Visit
Admission: RE | Admit: 2023-01-31 | Discharge: 2023-01-31 | Disposition: A | Discharge status: Home / Self Care | Payer: Medicare Other | Source: Ambulatory Visit | Attending: Radiation Oncology | Admitting: Radiation Oncology

## 2023-01-31 ENCOUNTER — Other Ambulatory Visit: Payer: Self-pay

## 2023-01-31 DIAGNOSIS — C61 Malignant neoplasm of prostate: Secondary | ICD-10-CM | POA: Diagnosis not present

## 2023-01-31 DIAGNOSIS — Z510 Encounter for antineoplastic radiation therapy: Secondary | ICD-10-CM | POA: Diagnosis not present

## 2023-01-31 DIAGNOSIS — Z191 Hormone sensitive malignancy status: Secondary | ICD-10-CM | POA: Diagnosis not present

## 2023-01-31 LAB — RAD ONC ARIA SESSION SUMMARY
Course Elapsed Days: 1
Plan Fractions Treated to Date: 2
Plan Prescribed Dose Per Fraction: 2.5 Gy
Plan Total Fractions Prescribed: 28
Plan Total Prescribed Dose: 70 Gy
Reference Point Dosage Given to Date: 5 Gy
Reference Point Session Dosage Given: 2.5 Gy
Session Number: 2

## 2023-02-01 ENCOUNTER — Other Ambulatory Visit: Payer: Self-pay

## 2023-02-01 ENCOUNTER — Ambulatory Visit
Admission: RE | Admit: 2023-02-01 | Discharge: 2023-02-01 | Disposition: A | Discharge status: Home / Self Care | Payer: Medicare Other | Source: Ambulatory Visit | Attending: Radiation Oncology | Admitting: Radiation Oncology

## 2023-02-01 DIAGNOSIS — Z510 Encounter for antineoplastic radiation therapy: Secondary | ICD-10-CM | POA: Diagnosis not present

## 2023-02-01 DIAGNOSIS — C61 Malignant neoplasm of prostate: Secondary | ICD-10-CM | POA: Diagnosis not present

## 2023-02-01 DIAGNOSIS — Z191 Hormone sensitive malignancy status: Secondary | ICD-10-CM | POA: Diagnosis not present

## 2023-02-01 LAB — RAD ONC ARIA SESSION SUMMARY
Course Elapsed Days: 2
Plan Fractions Treated to Date: 3
Plan Prescribed Dose Per Fraction: 2.5 Gy
Plan Total Fractions Prescribed: 28
Plan Total Prescribed Dose: 70 Gy
Reference Point Dosage Given to Date: 7.5 Gy
Reference Point Session Dosage Given: 2.5 Gy
Session Number: 3

## 2023-02-04 ENCOUNTER — Ambulatory Visit
Admission: RE | Admit: 2023-02-04 | Discharge: 2023-02-04 | Disposition: A | Discharge status: Home / Self Care | Payer: Medicare Other | Source: Ambulatory Visit | Attending: Radiation Oncology | Admitting: Radiation Oncology

## 2023-02-04 ENCOUNTER — Other Ambulatory Visit: Payer: Self-pay

## 2023-02-04 DIAGNOSIS — Z510 Encounter for antineoplastic radiation therapy: Secondary | ICD-10-CM | POA: Diagnosis not present

## 2023-02-04 DIAGNOSIS — Z191 Hormone sensitive malignancy status: Secondary | ICD-10-CM | POA: Diagnosis not present

## 2023-02-04 DIAGNOSIS — C61 Malignant neoplasm of prostate: Secondary | ICD-10-CM | POA: Diagnosis not present

## 2023-02-04 LAB — RAD ONC ARIA SESSION SUMMARY
Course Elapsed Days: 5
Plan Fractions Treated to Date: 4
Plan Prescribed Dose Per Fraction: 2.5 Gy
Plan Total Fractions Prescribed: 28
Plan Total Prescribed Dose: 70 Gy
Reference Point Dosage Given to Date: 10 Gy
Reference Point Session Dosage Given: 2.5 Gy
Session Number: 4

## 2023-02-05 ENCOUNTER — Ambulatory Visit
Admission: RE | Admit: 2023-02-05 | Discharge: 2023-02-05 | Disposition: A | Discharge status: Home / Self Care | Payer: Medicare Other | Source: Ambulatory Visit | Attending: Radiation Oncology | Admitting: Radiation Oncology

## 2023-02-05 ENCOUNTER — Other Ambulatory Visit: Payer: Self-pay

## 2023-02-05 DIAGNOSIS — Z191 Hormone sensitive malignancy status: Secondary | ICD-10-CM | POA: Diagnosis not present

## 2023-02-05 DIAGNOSIS — C61 Malignant neoplasm of prostate: Secondary | ICD-10-CM | POA: Diagnosis not present

## 2023-02-05 DIAGNOSIS — Z510 Encounter for antineoplastic radiation therapy: Secondary | ICD-10-CM | POA: Diagnosis not present

## 2023-02-05 LAB — RAD ONC ARIA SESSION SUMMARY
Course Elapsed Days: 6
Plan Fractions Treated to Date: 5
Plan Prescribed Dose Per Fraction: 2.5 Gy
Plan Total Fractions Prescribed: 28
Plan Total Prescribed Dose: 70 Gy
Reference Point Dosage Given to Date: 12.5 Gy
Reference Point Session Dosage Given: 2.5 Gy
Session Number: 5

## 2023-02-06 ENCOUNTER — Other Ambulatory Visit: Payer: Self-pay

## 2023-02-06 ENCOUNTER — Ambulatory Visit
Admission: RE | Admit: 2023-02-06 | Discharge: 2023-02-06 | Disposition: A | Discharge status: Home / Self Care | Payer: Medicare Other | Source: Ambulatory Visit | Attending: Radiation Oncology | Admitting: Radiation Oncology

## 2023-02-06 DIAGNOSIS — C61 Malignant neoplasm of prostate: Secondary | ICD-10-CM | POA: Diagnosis not present

## 2023-02-06 DIAGNOSIS — Z510 Encounter for antineoplastic radiation therapy: Secondary | ICD-10-CM | POA: Diagnosis not present

## 2023-02-06 DIAGNOSIS — Z191 Hormone sensitive malignancy status: Secondary | ICD-10-CM | POA: Diagnosis not present

## 2023-02-06 LAB — RAD ONC ARIA SESSION SUMMARY
Course Elapsed Days: 7
Plan Fractions Treated to Date: 6
Plan Prescribed Dose Per Fraction: 2.5 Gy
Plan Total Fractions Prescribed: 28
Plan Total Prescribed Dose: 70 Gy
Reference Point Dosage Given to Date: 15 Gy
Reference Point Session Dosage Given: 2.5 Gy
Session Number: 6

## 2023-02-07 ENCOUNTER — Other Ambulatory Visit: Payer: Self-pay

## 2023-02-07 ENCOUNTER — Ambulatory Visit
Admission: RE | Admit: 2023-02-07 | Discharge: 2023-02-07 | Disposition: A | Discharge status: Home / Self Care | Payer: Medicare Other | Source: Ambulatory Visit | Attending: Radiation Oncology | Admitting: Radiation Oncology

## 2023-02-07 DIAGNOSIS — Z510 Encounter for antineoplastic radiation therapy: Secondary | ICD-10-CM | POA: Diagnosis not present

## 2023-02-07 DIAGNOSIS — Z191 Hormone sensitive malignancy status: Secondary | ICD-10-CM | POA: Diagnosis not present

## 2023-02-07 DIAGNOSIS — C61 Malignant neoplasm of prostate: Secondary | ICD-10-CM | POA: Diagnosis not present

## 2023-02-07 LAB — RAD ONC ARIA SESSION SUMMARY
Course Elapsed Days: 8
Plan Fractions Treated to Date: 7
Plan Prescribed Dose Per Fraction: 2.5 Gy
Plan Total Fractions Prescribed: 28
Plan Total Prescribed Dose: 70 Gy
Reference Point Dosage Given to Date: 17.5 Gy
Reference Point Session Dosage Given: 2.5 Gy
Session Number: 7

## 2023-02-08 ENCOUNTER — Other Ambulatory Visit: Payer: Self-pay

## 2023-02-08 ENCOUNTER — Ambulatory Visit
Admission: RE | Admit: 2023-02-08 | Discharge: 2023-02-08 | Disposition: A | Discharge status: Home / Self Care | Payer: Medicare Other | Source: Ambulatory Visit | Attending: Radiation Oncology | Admitting: Radiation Oncology

## 2023-02-08 DIAGNOSIS — C61 Malignant neoplasm of prostate: Secondary | ICD-10-CM | POA: Diagnosis not present

## 2023-02-08 DIAGNOSIS — Z510 Encounter for antineoplastic radiation therapy: Secondary | ICD-10-CM | POA: Insufficient documentation

## 2023-02-08 DIAGNOSIS — Z191 Hormone sensitive malignancy status: Secondary | ICD-10-CM | POA: Diagnosis not present

## 2023-02-08 LAB — RAD ONC ARIA SESSION SUMMARY
Course Elapsed Days: 9
Plan Fractions Treated to Date: 8
Plan Prescribed Dose Per Fraction: 2.5 Gy
Plan Total Fractions Prescribed: 28
Plan Total Prescribed Dose: 70 Gy
Reference Point Dosage Given to Date: 20 Gy
Reference Point Session Dosage Given: 2.5 Gy
Session Number: 8

## 2023-02-11 ENCOUNTER — Ambulatory Visit
Admission: RE | Admit: 2023-02-11 | Discharge: 2023-02-11 | Disposition: A | Discharge status: Home / Self Care | Payer: Medicare Other | Source: Ambulatory Visit | Attending: Radiation Oncology | Admitting: Radiation Oncology

## 2023-02-11 ENCOUNTER — Other Ambulatory Visit: Payer: Self-pay

## 2023-02-11 DIAGNOSIS — Z191 Hormone sensitive malignancy status: Secondary | ICD-10-CM | POA: Diagnosis not present

## 2023-02-11 DIAGNOSIS — Z510 Encounter for antineoplastic radiation therapy: Secondary | ICD-10-CM | POA: Diagnosis not present

## 2023-02-11 DIAGNOSIS — C61 Malignant neoplasm of prostate: Secondary | ICD-10-CM | POA: Diagnosis not present

## 2023-02-11 LAB — RAD ONC ARIA SESSION SUMMARY
Course Elapsed Days: 12
Plan Fractions Treated to Date: 9
Plan Prescribed Dose Per Fraction: 2.5 Gy
Plan Total Fractions Prescribed: 28
Plan Total Prescribed Dose: 70 Gy
Reference Point Dosage Given to Date: 22.5 Gy
Reference Point Session Dosage Given: 2.5 Gy
Session Number: 9

## 2023-02-12 ENCOUNTER — Ambulatory Visit
Admission: RE | Admit: 2023-02-12 | Discharge: 2023-02-12 | Disposition: A | Discharge status: Home / Self Care | Payer: Medicare Other | Source: Ambulatory Visit | Attending: Radiation Oncology | Admitting: Radiation Oncology

## 2023-02-12 ENCOUNTER — Other Ambulatory Visit: Payer: Self-pay

## 2023-02-12 DIAGNOSIS — C61 Malignant neoplasm of prostate: Secondary | ICD-10-CM | POA: Diagnosis not present

## 2023-02-12 DIAGNOSIS — Z191 Hormone sensitive malignancy status: Secondary | ICD-10-CM | POA: Diagnosis not present

## 2023-02-12 DIAGNOSIS — Z510 Encounter for antineoplastic radiation therapy: Secondary | ICD-10-CM | POA: Diagnosis not present

## 2023-02-12 LAB — RAD ONC ARIA SESSION SUMMARY
Course Elapsed Days: 13
Plan Fractions Treated to Date: 10
Plan Prescribed Dose Per Fraction: 2.5 Gy
Plan Total Fractions Prescribed: 28
Plan Total Prescribed Dose: 70 Gy
Reference Point Dosage Given to Date: 25 Gy
Reference Point Session Dosage Given: 2.5 Gy
Session Number: 10

## 2023-02-13 ENCOUNTER — Ambulatory Visit
Admission: RE | Admit: 2023-02-13 | Discharge: 2023-02-13 | Disposition: A | Discharge status: Home / Self Care | Payer: Medicare Other | Source: Ambulatory Visit | Attending: Radiation Oncology | Admitting: Radiation Oncology

## 2023-02-13 ENCOUNTER — Other Ambulatory Visit: Payer: Self-pay

## 2023-02-13 DIAGNOSIS — C61 Malignant neoplasm of prostate: Secondary | ICD-10-CM | POA: Diagnosis not present

## 2023-02-13 DIAGNOSIS — Z510 Encounter for antineoplastic radiation therapy: Secondary | ICD-10-CM | POA: Diagnosis not present

## 2023-02-13 DIAGNOSIS — Z191 Hormone sensitive malignancy status: Secondary | ICD-10-CM | POA: Diagnosis not present

## 2023-02-13 LAB — RAD ONC ARIA SESSION SUMMARY
Course Elapsed Days: 14
Plan Fractions Treated to Date: 11
Plan Prescribed Dose Per Fraction: 2.5 Gy
Plan Total Fractions Prescribed: 28
Plan Total Prescribed Dose: 70 Gy
Reference Point Dosage Given to Date: 27.5 Gy
Reference Point Session Dosage Given: 2.5 Gy
Session Number: 11

## 2023-02-14 ENCOUNTER — Ambulatory Visit
Admission: RE | Admit: 2023-02-14 | Discharge: 2023-02-14 | Disposition: A | Discharge status: Home / Self Care | Payer: Medicare Other | Source: Ambulatory Visit | Attending: Radiation Oncology | Admitting: Radiation Oncology

## 2023-02-14 ENCOUNTER — Other Ambulatory Visit: Payer: Self-pay

## 2023-02-14 DIAGNOSIS — Z510 Encounter for antineoplastic radiation therapy: Secondary | ICD-10-CM | POA: Diagnosis not present

## 2023-02-14 DIAGNOSIS — C61 Malignant neoplasm of prostate: Secondary | ICD-10-CM | POA: Diagnosis not present

## 2023-02-14 DIAGNOSIS — Z191 Hormone sensitive malignancy status: Secondary | ICD-10-CM | POA: Diagnosis not present

## 2023-02-14 LAB — RAD ONC ARIA SESSION SUMMARY
Course Elapsed Days: 15
Plan Fractions Treated to Date: 12
Plan Prescribed Dose Per Fraction: 2.5 Gy
Plan Total Fractions Prescribed: 28
Plan Total Prescribed Dose: 70 Gy
Reference Point Dosage Given to Date: 30 Gy
Reference Point Session Dosage Given: 2.5 Gy
Session Number: 12

## 2023-02-15 ENCOUNTER — Ambulatory Visit
Admission: RE | Admit: 2023-02-15 | Discharge: 2023-02-15 | Disposition: A | Discharge status: Home / Self Care | Payer: Medicare Other | Source: Ambulatory Visit | Attending: Radiation Oncology | Admitting: Radiation Oncology

## 2023-02-15 ENCOUNTER — Other Ambulatory Visit: Payer: Self-pay

## 2023-02-15 DIAGNOSIS — Z191 Hormone sensitive malignancy status: Secondary | ICD-10-CM | POA: Diagnosis not present

## 2023-02-15 DIAGNOSIS — Z510 Encounter for antineoplastic radiation therapy: Secondary | ICD-10-CM | POA: Diagnosis not present

## 2023-02-15 DIAGNOSIS — C61 Malignant neoplasm of prostate: Secondary | ICD-10-CM | POA: Diagnosis not present

## 2023-02-15 LAB — RAD ONC ARIA SESSION SUMMARY
Course Elapsed Days: 16
Plan Fractions Treated to Date: 13
Plan Prescribed Dose Per Fraction: 2.5 Gy
Plan Total Fractions Prescribed: 28
Plan Total Prescribed Dose: 70 Gy
Reference Point Dosage Given to Date: 32.5 Gy
Reference Point Session Dosage Given: 2.5 Gy
Session Number: 13

## 2023-02-18 ENCOUNTER — Ambulatory Visit
Admission: RE | Admit: 2023-02-18 | Discharge: 2023-02-18 | Disposition: A | Discharge status: Home / Self Care | Payer: Medicare Other | Source: Ambulatory Visit | Attending: Radiation Oncology | Admitting: Radiation Oncology

## 2023-02-18 ENCOUNTER — Other Ambulatory Visit: Payer: Self-pay

## 2023-02-18 DIAGNOSIS — C61 Malignant neoplasm of prostate: Secondary | ICD-10-CM | POA: Diagnosis not present

## 2023-02-18 DIAGNOSIS — Z510 Encounter for antineoplastic radiation therapy: Secondary | ICD-10-CM | POA: Diagnosis not present

## 2023-02-18 DIAGNOSIS — Z191 Hormone sensitive malignancy status: Secondary | ICD-10-CM | POA: Diagnosis not present

## 2023-02-18 LAB — RAD ONC ARIA SESSION SUMMARY
Course Elapsed Days: 19
Plan Fractions Treated to Date: 14
Plan Prescribed Dose Per Fraction: 2.5 Gy
Plan Total Fractions Prescribed: 28
Plan Total Prescribed Dose: 70 Gy
Reference Point Dosage Given to Date: 35 Gy
Reference Point Session Dosage Given: 2.5 Gy
Session Number: 14

## 2023-02-19 ENCOUNTER — Other Ambulatory Visit: Payer: Self-pay

## 2023-02-19 ENCOUNTER — Ambulatory Visit
Admission: RE | Admit: 2023-02-19 | Discharge: 2023-02-19 | Disposition: A | Discharge status: Home / Self Care | Payer: Medicare Other | Source: Ambulatory Visit | Attending: Radiation Oncology | Admitting: Radiation Oncology

## 2023-02-19 DIAGNOSIS — Z191 Hormone sensitive malignancy status: Secondary | ICD-10-CM | POA: Diagnosis not present

## 2023-02-19 DIAGNOSIS — Z510 Encounter for antineoplastic radiation therapy: Secondary | ICD-10-CM | POA: Diagnosis not present

## 2023-02-19 DIAGNOSIS — C61 Malignant neoplasm of prostate: Secondary | ICD-10-CM | POA: Diagnosis not present

## 2023-02-19 LAB — RAD ONC ARIA SESSION SUMMARY
Course Elapsed Days: 20
Plan Fractions Treated to Date: 15
Plan Prescribed Dose Per Fraction: 2.5 Gy
Plan Total Fractions Prescribed: 28
Plan Total Prescribed Dose: 70 Gy
Reference Point Dosage Given to Date: 37.5 Gy
Reference Point Session Dosage Given: 2.5 Gy
Session Number: 15

## 2023-02-20 ENCOUNTER — Other Ambulatory Visit: Payer: Self-pay

## 2023-02-20 ENCOUNTER — Ambulatory Visit
Admission: RE | Admit: 2023-02-20 | Discharge: 2023-02-20 | Disposition: A | Discharge status: Home / Self Care | Payer: Medicare Other | Source: Ambulatory Visit | Attending: Radiation Oncology | Admitting: Radiation Oncology

## 2023-02-20 ENCOUNTER — Telehealth: Payer: Self-pay

## 2023-02-20 DIAGNOSIS — C61 Malignant neoplasm of prostate: Secondary | ICD-10-CM | POA: Diagnosis not present

## 2023-02-20 DIAGNOSIS — Z510 Encounter for antineoplastic radiation therapy: Secondary | ICD-10-CM | POA: Diagnosis not present

## 2023-02-20 DIAGNOSIS — Z191 Hormone sensitive malignancy status: Secondary | ICD-10-CM | POA: Diagnosis not present

## 2023-02-20 LAB — RAD ONC ARIA SESSION SUMMARY
Course Elapsed Days: 21
Plan Fractions Treated to Date: 16
Plan Prescribed Dose Per Fraction: 2.5 Gy
Plan Total Fractions Prescribed: 28
Plan Total Prescribed Dose: 70 Gy
Reference Point Dosage Given to Date: 40 Gy
Reference Point Session Dosage Given: 2.5 Gy
Session Number: 16

## 2023-02-20 NOTE — Telephone Encounter (Addendum)
RN spoke with Cristian Nunez daughter Cristian Nunez) she had concerns about side effects.  Cristian Nunez was having diarrhea, weakness, vasovagal response, urinary frequency, and weak slow stream.  Cristian Nunez has had 16/28 prostate radiation treatments.  Advised daughter that he will experience side effects 10-14 days after treatment has started that this is expected.  Advised her to get some Imodium AD have Cristian Nunez take as directed can take up to 8 tablets per day.  Increase water intake or Gatorade as tolerated.  Denies dysuria, hematuria, fever, chills, nausea/vomiting.  Advise Cristian Nunez if Cristian Nunez symptoms should worsen or any new symptoms to get him to the emergency room ASAP.   She agreed and verbalized understanding.

## 2023-02-21 ENCOUNTER — Ambulatory Visit
Admission: RE | Admit: 2023-02-21 | Discharge: 2023-02-21 | Disposition: A | Discharge status: Home / Self Care | Payer: Medicare Other | Source: Ambulatory Visit | Attending: Radiation Oncology | Admitting: Radiation Oncology

## 2023-02-21 ENCOUNTER — Other Ambulatory Visit: Payer: Self-pay

## 2023-02-21 ENCOUNTER — Other Ambulatory Visit: Payer: Self-pay | Admitting: Radiation Oncology

## 2023-02-21 DIAGNOSIS — Z191 Hormone sensitive malignancy status: Secondary | ICD-10-CM | POA: Diagnosis not present

## 2023-02-21 DIAGNOSIS — C61 Malignant neoplasm of prostate: Secondary | ICD-10-CM | POA: Diagnosis not present

## 2023-02-21 DIAGNOSIS — Z510 Encounter for antineoplastic radiation therapy: Secondary | ICD-10-CM | POA: Diagnosis not present

## 2023-02-21 LAB — RAD ONC ARIA SESSION SUMMARY
Course Elapsed Days: 22
Plan Fractions Treated to Date: 17
Plan Prescribed Dose Per Fraction: 2.5 Gy
Plan Total Fractions Prescribed: 28
Plan Total Prescribed Dose: 70 Gy
Reference Point Dosage Given to Date: 42.5 Gy
Reference Point Session Dosage Given: 2.5 Gy
Session Number: 17

## 2023-02-21 MED ORDER — TAMSULOSIN HCL 0.4 MG PO CAPS: 0.4000 mg | ORAL_CAPSULE | Freq: Every day | ORAL | 5 refills | Status: DC

## 2023-02-22 ENCOUNTER — Other Ambulatory Visit: Payer: Self-pay

## 2023-02-22 ENCOUNTER — Ambulatory Visit
Admission: RE | Admit: 2023-02-22 | Discharge: 2023-02-22 | Disposition: A | Discharge status: Home / Self Care | Payer: Medicare Other | Source: Ambulatory Visit | Attending: Radiation Oncology | Admitting: Radiation Oncology

## 2023-02-22 DIAGNOSIS — C61 Malignant neoplasm of prostate: Secondary | ICD-10-CM | POA: Diagnosis not present

## 2023-02-22 DIAGNOSIS — Z510 Encounter for antineoplastic radiation therapy: Secondary | ICD-10-CM | POA: Diagnosis not present

## 2023-02-22 DIAGNOSIS — Z191 Hormone sensitive malignancy status: Secondary | ICD-10-CM | POA: Diagnosis not present

## 2023-02-22 LAB — RAD ONC ARIA SESSION SUMMARY
Course Elapsed Days: 23
Plan Fractions Treated to Date: 18
Plan Prescribed Dose Per Fraction: 2.5 Gy
Plan Total Fractions Prescribed: 28
Plan Total Prescribed Dose: 70 Gy
Reference Point Dosage Given to Date: 45 Gy
Reference Point Session Dosage Given: 2.5 Gy
Session Number: 18

## 2023-02-25 ENCOUNTER — Ambulatory Visit
Admission: RE | Admit: 2023-02-25 | Discharge: 2023-02-25 | Disposition: A | Discharge status: Home / Self Care | Payer: Medicare Other | Source: Ambulatory Visit | Attending: Radiation Oncology | Admitting: Radiation Oncology

## 2023-02-25 ENCOUNTER — Other Ambulatory Visit: Payer: Self-pay

## 2023-02-25 DIAGNOSIS — Z191 Hormone sensitive malignancy status: Secondary | ICD-10-CM | POA: Diagnosis not present

## 2023-02-25 DIAGNOSIS — Z510 Encounter for antineoplastic radiation therapy: Secondary | ICD-10-CM | POA: Diagnosis not present

## 2023-02-25 DIAGNOSIS — C61 Malignant neoplasm of prostate: Secondary | ICD-10-CM | POA: Diagnosis not present

## 2023-02-25 LAB — RAD ONC ARIA SESSION SUMMARY
Course Elapsed Days: 26
Plan Fractions Treated to Date: 19
Plan Prescribed Dose Per Fraction: 2.5 Gy
Plan Total Fractions Prescribed: 28
Plan Total Prescribed Dose: 70 Gy
Reference Point Dosage Given to Date: 47.5 Gy
Reference Point Session Dosage Given: 2.5 Gy
Session Number: 19

## 2023-02-26 ENCOUNTER — Other Ambulatory Visit: Payer: Self-pay

## 2023-02-26 ENCOUNTER — Ambulatory Visit
Admission: RE | Admit: 2023-02-26 | Discharge: 2023-02-26 | Disposition: A | Discharge status: Home / Self Care | Payer: Medicare Other | Source: Ambulatory Visit | Attending: Radiation Oncology | Admitting: Radiation Oncology

## 2023-02-26 DIAGNOSIS — C61 Malignant neoplasm of prostate: Secondary | ICD-10-CM | POA: Diagnosis not present

## 2023-02-26 DIAGNOSIS — Z191 Hormone sensitive malignancy status: Secondary | ICD-10-CM | POA: Diagnosis not present

## 2023-02-26 DIAGNOSIS — Z510 Encounter for antineoplastic radiation therapy: Secondary | ICD-10-CM | POA: Diagnosis not present

## 2023-02-26 LAB — RAD ONC ARIA SESSION SUMMARY
Course Elapsed Days: 27
Plan Fractions Treated to Date: 20
Plan Prescribed Dose Per Fraction: 2.5 Gy
Plan Total Fractions Prescribed: 28
Plan Total Prescribed Dose: 70 Gy
Reference Point Dosage Given to Date: 50 Gy
Reference Point Session Dosage Given: 2.5 Gy
Session Number: 20

## 2023-02-27 ENCOUNTER — Other Ambulatory Visit: Payer: Self-pay

## 2023-02-27 ENCOUNTER — Ambulatory Visit
Admission: RE | Admit: 2023-02-27 | Discharge: 2023-02-27 | Disposition: A | Discharge status: Home / Self Care | Payer: Medicare Other | Source: Ambulatory Visit | Attending: Radiation Oncology | Admitting: Radiation Oncology

## 2023-02-27 DIAGNOSIS — C61 Malignant neoplasm of prostate: Secondary | ICD-10-CM | POA: Diagnosis not present

## 2023-02-27 DIAGNOSIS — Z510 Encounter for antineoplastic radiation therapy: Secondary | ICD-10-CM | POA: Diagnosis not present

## 2023-02-27 DIAGNOSIS — Z191 Hormone sensitive malignancy status: Secondary | ICD-10-CM | POA: Diagnosis not present

## 2023-02-27 LAB — RAD ONC ARIA SESSION SUMMARY
Course Elapsed Days: 28
Plan Fractions Treated to Date: 21
Plan Prescribed Dose Per Fraction: 2.5 Gy
Plan Total Fractions Prescribed: 28
Plan Total Prescribed Dose: 70 Gy
Reference Point Dosage Given to Date: 52.5 Gy
Reference Point Session Dosage Given: 2.5 Gy
Session Number: 21

## 2023-02-28 ENCOUNTER — Ambulatory Visit
Admission: RE | Admit: 2023-02-28 | Discharge: 2023-02-28 | Disposition: A | Discharge status: Home / Self Care | Payer: Medicare Other | Source: Ambulatory Visit | Attending: Radiation Oncology | Admitting: Radiation Oncology

## 2023-02-28 ENCOUNTER — Other Ambulatory Visit: Payer: Self-pay

## 2023-02-28 DIAGNOSIS — Z191 Hormone sensitive malignancy status: Secondary | ICD-10-CM | POA: Diagnosis not present

## 2023-02-28 DIAGNOSIS — C61 Malignant neoplasm of prostate: Secondary | ICD-10-CM | POA: Diagnosis not present

## 2023-02-28 DIAGNOSIS — Z510 Encounter for antineoplastic radiation therapy: Secondary | ICD-10-CM | POA: Diagnosis not present

## 2023-02-28 LAB — RAD ONC ARIA SESSION SUMMARY
Course Elapsed Days: 29
Plan Fractions Treated to Date: 22
Plan Prescribed Dose Per Fraction: 2.5 Gy
Plan Total Fractions Prescribed: 28
Plan Total Prescribed Dose: 70 Gy
Reference Point Dosage Given to Date: 55 Gy
Reference Point Session Dosage Given: 2.5 Gy
Session Number: 22

## 2023-03-01 ENCOUNTER — Ambulatory Visit
Admission: RE | Admit: 2023-03-01 | Discharge: 2023-03-01 | Disposition: A | Discharge status: Home / Self Care | Payer: Medicare Other | Source: Ambulatory Visit | Attending: Radiation Oncology | Admitting: Radiation Oncology

## 2023-03-01 ENCOUNTER — Other Ambulatory Visit: Payer: Self-pay

## 2023-03-01 DIAGNOSIS — C61 Malignant neoplasm of prostate: Secondary | ICD-10-CM | POA: Diagnosis not present

## 2023-03-01 DIAGNOSIS — Z510 Encounter for antineoplastic radiation therapy: Secondary | ICD-10-CM | POA: Diagnosis not present

## 2023-03-01 DIAGNOSIS — Z191 Hormone sensitive malignancy status: Secondary | ICD-10-CM | POA: Diagnosis not present

## 2023-03-01 LAB — RAD ONC ARIA SESSION SUMMARY
Course Elapsed Days: 30
Plan Fractions Treated to Date: 23
Plan Prescribed Dose Per Fraction: 2.5 Gy
Plan Total Fractions Prescribed: 28
Plan Total Prescribed Dose: 70 Gy
Reference Point Dosage Given to Date: 57.5 Gy
Reference Point Session Dosage Given: 2.5 Gy
Session Number: 23

## 2023-03-04 ENCOUNTER — Ambulatory Visit
Admission: RE | Admit: 2023-03-04 | Discharge: 2023-03-04 | Disposition: A | Discharge status: Home / Self Care | Payer: Medicare Other | Source: Ambulatory Visit | Attending: Radiation Oncology | Admitting: Radiation Oncology

## 2023-03-04 ENCOUNTER — Other Ambulatory Visit: Payer: Self-pay

## 2023-03-04 DIAGNOSIS — C61 Malignant neoplasm of prostate: Secondary | ICD-10-CM | POA: Diagnosis not present

## 2023-03-04 DIAGNOSIS — Z191 Hormone sensitive malignancy status: Secondary | ICD-10-CM | POA: Diagnosis not present

## 2023-03-04 DIAGNOSIS — Z510 Encounter for antineoplastic radiation therapy: Secondary | ICD-10-CM | POA: Diagnosis not present

## 2023-03-04 LAB — RAD ONC ARIA SESSION SUMMARY
Course Elapsed Days: 33
Plan Fractions Treated to Date: 24
Plan Prescribed Dose Per Fraction: 2.5 Gy
Plan Total Fractions Prescribed: 28
Plan Total Prescribed Dose: 70 Gy
Reference Point Dosage Given to Date: 60 Gy
Reference Point Session Dosage Given: 2.5 Gy
Session Number: 24

## 2023-03-05 ENCOUNTER — Other Ambulatory Visit: Payer: Self-pay

## 2023-03-05 ENCOUNTER — Ambulatory Visit
Admission: RE | Admit: 2023-03-05 | Discharge: 2023-03-05 | Disposition: A | Discharge status: Home / Self Care | Payer: Medicare Other | Source: Ambulatory Visit | Attending: Radiation Oncology | Admitting: Radiation Oncology

## 2023-03-05 DIAGNOSIS — C61 Malignant neoplasm of prostate: Secondary | ICD-10-CM | POA: Diagnosis not present

## 2023-03-05 DIAGNOSIS — Z191 Hormone sensitive malignancy status: Secondary | ICD-10-CM | POA: Diagnosis not present

## 2023-03-05 DIAGNOSIS — Z510 Encounter for antineoplastic radiation therapy: Secondary | ICD-10-CM | POA: Diagnosis not present

## 2023-03-05 LAB — RAD ONC ARIA SESSION SUMMARY
Course Elapsed Days: 34
Plan Fractions Treated to Date: 25
Plan Prescribed Dose Per Fraction: 2.5 Gy
Plan Total Fractions Prescribed: 28
Plan Total Prescribed Dose: 70 Gy
Reference Point Dosage Given to Date: 62.5 Gy
Reference Point Session Dosage Given: 2.5 Gy
Session Number: 25

## 2023-03-06 ENCOUNTER — Other Ambulatory Visit: Payer: Self-pay

## 2023-03-06 ENCOUNTER — Ambulatory Visit
Admission: RE | Admit: 2023-03-06 | Discharge: 2023-03-06 | Disposition: A | Discharge status: Home / Self Care | Payer: Medicare Other | Source: Ambulatory Visit | Attending: Radiation Oncology | Admitting: Radiation Oncology

## 2023-03-06 DIAGNOSIS — C61 Malignant neoplasm of prostate: Secondary | ICD-10-CM | POA: Diagnosis not present

## 2023-03-06 DIAGNOSIS — Z191 Hormone sensitive malignancy status: Secondary | ICD-10-CM | POA: Diagnosis not present

## 2023-03-06 DIAGNOSIS — Z510 Encounter for antineoplastic radiation therapy: Secondary | ICD-10-CM | POA: Diagnosis not present

## 2023-03-06 LAB — RAD ONC ARIA SESSION SUMMARY
Course Elapsed Days: 35
Plan Fractions Treated to Date: 26
Plan Prescribed Dose Per Fraction: 2.5 Gy
Plan Total Fractions Prescribed: 28
Plan Total Prescribed Dose: 70 Gy
Reference Point Dosage Given to Date: 65 Gy
Reference Point Session Dosage Given: 2.5 Gy
Session Number: 26

## 2023-03-07 ENCOUNTER — Ambulatory Visit
Admission: RE | Admit: 2023-03-07 | Discharge: 2023-03-07 | Disposition: A | Discharge status: Home / Self Care | Payer: Medicare Other | Source: Ambulatory Visit | Attending: Radiation Oncology | Admitting: Radiation Oncology

## 2023-03-07 ENCOUNTER — Other Ambulatory Visit: Payer: Self-pay

## 2023-03-07 DIAGNOSIS — C61 Malignant neoplasm of prostate: Secondary | ICD-10-CM | POA: Diagnosis not present

## 2023-03-07 DIAGNOSIS — Z191 Hormone sensitive malignancy status: Secondary | ICD-10-CM | POA: Diagnosis not present

## 2023-03-07 DIAGNOSIS — Z510 Encounter for antineoplastic radiation therapy: Secondary | ICD-10-CM | POA: Diagnosis not present

## 2023-03-07 LAB — RAD ONC ARIA SESSION SUMMARY
Course Elapsed Days: 36
Plan Fractions Treated to Date: 27
Plan Prescribed Dose Per Fraction: 2.5 Gy
Plan Total Fractions Prescribed: 28
Plan Total Prescribed Dose: 70 Gy
Reference Point Dosage Given to Date: 67.5 Gy
Reference Point Session Dosage Given: 2.5 Gy
Session Number: 27

## 2023-03-08 ENCOUNTER — Encounter: Payer: Self-pay | Admitting: Urology

## 2023-03-08 ENCOUNTER — Other Ambulatory Visit: Payer: Self-pay

## 2023-03-08 ENCOUNTER — Ambulatory Visit
Admission: RE | Admit: 2023-03-08 | Discharge: 2023-03-08 | Disposition: A | Discharge status: Home / Self Care | Payer: Medicare Other | Source: Ambulatory Visit | Attending: Radiation Oncology | Admitting: Radiation Oncology

## 2023-03-08 DIAGNOSIS — Z191 Hormone sensitive malignancy status: Secondary | ICD-10-CM | POA: Diagnosis not present

## 2023-03-08 DIAGNOSIS — Z510 Encounter for antineoplastic radiation therapy: Secondary | ICD-10-CM | POA: Diagnosis not present

## 2023-03-08 DIAGNOSIS — C61 Malignant neoplasm of prostate: Secondary | ICD-10-CM | POA: Diagnosis not present

## 2023-03-08 LAB — RAD ONC ARIA SESSION SUMMARY
Course Elapsed Days: 37
Plan Fractions Treated to Date: 28
Plan Prescribed Dose Per Fraction: 2.5 Gy
Plan Total Fractions Prescribed: 28
Plan Total Prescribed Dose: 70 Gy
Reference Point Dosage Given to Date: 70 Gy
Reference Point Session Dosage Given: 2.5 Gy
Session Number: 28

## 2023-03-29 ENCOUNTER — Other Ambulatory Visit: Payer: Self-pay | Admitting: Urology

## 2023-03-29 DIAGNOSIS — C61 Malignant neoplasm of prostate: Secondary | ICD-10-CM

## 2023-03-29 NOTE — Progress Notes (Signed)
  Radiation Oncology         [phone] ________________________________  Name: Cristian Nunez MRN: [id number]  Date: 03/08/2023  DOB: May 17, 1946  End of Treatment Note  Diagnosis:   77 y.o. gentleman with Stage T2a adenocarcinoma of the prostate with Gleason score of 4+3, and PSA of 6.6.      Indication for treatment:  Curative, Definitive Radiotherapy       Radiation treatment dates:   01/30/23 - 03/08/23  Site/dose:   The prostate was treated to 70 Gy in 28 fractions of 2.5 Gy  Beams/energy:   The patient was treated with IMRT using volumetric arc therapy delivering 6 MV X-rays to clockwise and counterclockwise circumferential arcs with a 90 degree collimator offset to avoid dose scalloping.  Image guidance was performed with daily cone beam CT prior to each fraction to align to gold markers in the prostate and assure proper bladder and rectal fill volumes.  Immobilization was achieved with BodyFix custom mold.  Narrative: The patient tolerated radiation treatment relatively well.   The patient experienced some minor urinary irritation and modest fatigue.  He did report increased frequency, urgency, and nocturia 4-5 times per night that were all improved with starting Flomax.  He also experienced some loose stools/diarrhea that resolved with Imodium AD as needed.  Plan: The patient will receive a call in about one month from the radiation oncology department. He will continue follow up with his urologist, Dr. Retta Diones, as well.  ________________________________  Artist Pais. Kathrynn Running, M.D.

## 2023-04-09 ENCOUNTER — Ambulatory Visit
Admission: RE | Admit: 2023-04-09 | Discharge: 2023-04-09 | Disposition: A | Discharge status: Home / Self Care | Payer: Medicare Other | Source: Ambulatory Visit | Attending: Radiation Oncology | Admitting: Radiation Oncology

## 2023-04-09 DIAGNOSIS — Z510 Encounter for antineoplastic radiation therapy: Secondary | ICD-10-CM | POA: Insufficient documentation

## 2023-04-09 DIAGNOSIS — C61 Malignant neoplasm of prostate: Secondary | ICD-10-CM | POA: Insufficient documentation

## 2023-04-09 NOTE — Progress Notes (Signed)
  Radiation Oncology         [phone] ________________________________  Name: Cristian Nunez MRN: [id number]  Date of Service: 04/09/2023  DOB: Jan 19, 1946  Post Treatment Telephone Note  Diagnosis:  77 y.o. gentleman with Stage T2a adenocarcinoma of the prostate with Gleason score of 4+3, and PSA of 6.6.       Indication for treatment:  Curative, Definitive Radiotherapy        Radiation treatment dates:   01/30/23 - 03/08/23   Site/dose:   The prostate was treated to 70 Gy in 28 fractions of 2.5 Gy (as documented in provider EOT note)   Pre Treatment IPSS Score: 12 (as documented in the provider consult note)   The patient was available for call today.   Symptoms of fatigue have improved since completing therapy.  Symptoms of bladder changes have improved since completing therapy. Current symptoms include none, and medications for bladder symptoms include Tamsulosin.  Symptoms of bowel changes have improved since completing therapy. Current symptoms include mildly lose stools w/ anal mucus, upon urination, and medications for bowel symptoms include none.     Post Treatment IPSS Score: IPSS Questionnaire (AUA-7): Over the past month.   1)  How often have you had a sensation of not emptying your bladder completely after you finish urinating?  0 - Not at all  2)  How often have you had to urinate again less than two hours after you finished urinating? 0 - Not at all  3)  How often have you found you stopped and started again several times when you urinated?  1 - Less than 1 time in 5  4) How difficult have you found it to postpone urination?  1 - Less than 1 time in 5  5) How often have you had a weak urinary stream?  2 - Less than half the time  6) How often have you had to push or strain to begin urination?  1 - Less than 1 time in 5  7) How many times did you most typically get up to urinate from the time you went to bed until the time you got up in the morning?  4 - 4 times   Total score:  9. Which indicates moderate symptoms  0-7 mildly symptomatic   8-19 moderately symptomatic   20-35 severely symptomatic    Patient has a scheduled follow up visit with his urologist, Dr. Retta Diones, on 04/2023 for ongoing surveillance. He was counseled that PSA levels will be drawn in the urology office, and was reassured that additional time is expected to improve bowel and bladder symptoms. He was encouraged to call back with concerns or questions regarding radiation.  This concludes the interview.   Ruel Favors, LPN

## 2023-04-17 DIAGNOSIS — C61 Malignant neoplasm of prostate: Secondary | ICD-10-CM | POA: Diagnosis not present

## 2023-04-17 DIAGNOSIS — D2920 Benign neoplasm of unspecified testis: Secondary | ICD-10-CM | POA: Diagnosis not present

## 2023-04-30 ENCOUNTER — Inpatient Hospital Stay: Payer: Medicare Other | Attending: Adult Health | Admitting: *Deleted

## 2023-04-30 ENCOUNTER — Encounter: Payer: Self-pay | Admitting: *Deleted

## 2023-04-30 DIAGNOSIS — C61 Malignant neoplasm of prostate: Secondary | ICD-10-CM

## 2023-04-30 NOTE — Progress Notes (Signed)
SCP reviewed and completed. 

## 2023-05-13 ENCOUNTER — Encounter: Payer: Self-pay | Admitting: Cardiovascular Disease

## 2023-05-13 ENCOUNTER — Ambulatory Visit: Payer: Medicare Other | Attending: Cardiovascular Disease | Admitting: Cardiovascular Disease

## 2023-05-13 VITALS — BP 130/80 | HR 76 | Ht 68.0 in | Wt 161.2 lb

## 2023-05-13 DIAGNOSIS — E782 Mixed hyperlipidemia: Secondary | ICD-10-CM

## 2023-05-13 DIAGNOSIS — I10 Essential (primary) hypertension: Secondary | ICD-10-CM | POA: Diagnosis not present

## 2023-05-13 DIAGNOSIS — I340 Nonrheumatic mitral (valve) insufficiency: Secondary | ICD-10-CM | POA: Diagnosis not present

## 2023-05-13 NOTE — Progress Notes (Signed)
Cardiology Office Note:    Date:  05/13/2023   ID:  Cristian Nunez, DOB 24-Aug-1946, MRN [id number]  PCP:  Cristian Fusi, MD   [facility] Providers Cardiologist:  Cristian Bollman, MD     Referring MD: Cristian Fusi, MD   Chief Complaint  Patient presents with   Mitral Regurgitation    History of Present Illness:    Cristian Nunez is a 77 y.o. male returning for follow-up of mitral regurgitation.  The patient is here with his wife today.  The patient has a Barlow's valve with longstanding severe mitral regurgitation.  He has been asymptomatic in the past and has been managed medically.  He was referred for cardiac surgical evaluation in December 2023 because he was noted to have some LV dysfunction associated with his severe MR.  LVEF dropped to 50 to 55% compared to his prior study which showed an EF of 60 to 65%.  He was seen by Dr. Leafy Nunez with recommendation for continued observation in the setting of comorbidities.  He had just had a recent GI bleed and also was diagnosed with prostate cancer requiring radiation therapy.    The patient reports that he is doing well from a cardiac perspective.  He denies chest pain or pressure, leg swelling, orthopnea, or PND.  He has no exertional dyspnea.  He does complain of generalized fatigue.  He feels like he is still recovering from radiation therapy for prostate cancer which she just recently finished.  He has also been on antiandrogen therapy and feels that both of these have contributed to his fatigue.  Past Medical History:  Diagnosis Date   Benign neoplasm of right testis    Chronic venous insufficiency    Edema of both lower extremities    Essential hypertension    cardiac cath 02-03-2021  diffuse disease normal lvf, RCA 40%   GERD (gastroesophageal reflux disease)    Hiatal hernia    History of lower GI bleeding 11/06/2022   admission in epic;  pt LOC,  Hg 6.9  dx diverticular bleed that spontenously resovled,   transfused 1 unit PRBC   History of thyroid cancer 1973   s/p  total thyroidectomy ;  per pt told cancer caused by neck radiation 1950s to prevent tonsill's/ adenoid's from growing back (no further treatment for thyroid cancer)   Hyperlipidemia, mixed    Hypertension    Hypothyroidism, postsurgical 1973   followed by pcp;     s/p total thyroidecomy for thyroid cancer secondary to neck radiation 1950s   Kyphoscoliosis    Malignant neoplasm prostate (HCC) 09/2022   urologist--- dr Cristian Nunez/  radiation oncologist--- dr Kathrynn Running:  dx 10/ 2023,  Gleason 4+3   Mitral valve prolapse    w/ severe MR   Mixed dyslipidemia 12/09/2018   Nocturia    Non-rheumatic mitral regurgitation 11/18/2019   cardiologist--- dr Excell Seltzer;   severe MR w/ prolapse,  last echo 10-26-2022  ef 50-55% mean grandiant 97.0 mmHg,   OA (osteoarthritis)    Psoriasis    Psoriatic arthritis (HCC)    followed by dermotologist--- dr Fatima Blank   Raynaud's syndrome    S/P balloon dilatation of esophageal stricture 04/12/2021   post radiation scarring    Past Surgical History:  Procedure Laterality Date   BALLOON DILATION N/A 04/12/2021   Procedure: BALLOON DILATION;  Surgeon: Cristian Boop, MD;  Location: [facility] ENDOSCOPY;  Service: Endoscopy;  Laterality: N/A;   CATARACT EXTRACTION W/ INTRAOCULAR LENS IMPLANT  Bilateral 2015   COLONOSCOPY     COLONOSCOPY WITH PROPOFOL N/A 11/07/2022   Procedure: COLONOSCOPY WITH PROPOFOL;  Surgeon: Cristian Lucks, MD;  Location: [facility] ENDOSCOPY;  Service: Gastroenterology;  Laterality: N/A;   ESOPHAGOGASTRODUODENOSCOPY (EGD) WITH PROPOFOL N/A 04/12/2021   Procedure: ESOPHAGOGASTRODUODENOSCOPY (EGD) WITH PROPOFOL;  Surgeon: Cristian Boop, MD;  Location: [facility] ENDOSCOPY;  Service: Endoscopy;  Laterality: N/A;  needs fluoro   GOLD SEED IMPLANT N/A 01/14/2023   Procedure: GOLD SEED IMPLANT;  Surgeon: Cristian Matar, MD;  Location: [facility];  Service: Urology;  Laterality: N/A;    INGUINAL HERNIA REPAIR Left 01/28/2019   Procedure: LEFT INGUINAL HERNIA REPAIR WITH MESH;  Surgeon: Cristian Loron, MD;  Location: [facility] OR;  Service: General;  Laterality: Left;  GENERAL ANESTHESIA AND TAP BLOCK   KNEE ARTHROSCOPY Left 1983   ORCHIECTOMY Right 01/14/2023   Procedure: ORCHIECTOMY;  Surgeon: Cristian Matar, MD;  Location: [facility];  Service: Urology;  Laterality: Right;   RIGHT/LEFT HEART CATH AND CORONARY ANGIOGRAPHY N/A 02/03/2021   Procedure: RIGHT/LEFT HEART CATH AND CORONARY ANGIOGRAPHY;  Surgeon: Cristian Bollman, MD;  Location: [facility] INVASIVE CV LAB;  Service: Cardiovascular;  Laterality: N/A;   SCROTAL EXPLORATION N/A 01/14/2023   Procedure: INGUINAL EXPLORATION;  Surgeon: Cristian Matar, MD;  Location: [facility];  Service: Urology;  Laterality: N/A;   SPACE OAR INSTILLATION N/A 01/14/2023   Procedure: SPACE OAR INSTILLATION;  Surgeon: Cristian Matar, MD;  Location: [facility];  Service: Urology;  Laterality: N/A;   TEE WITHOUT CARDIOVERSION N/A 04/12/2021   Procedure: TRANSESOPHAGEAL ECHOCARDIOGRAM (TEE);  Surgeon: Cristian Constant, MD;  Location: [facility] ENDOSCOPY;  Service: Cardiovascular;  Laterality: N/A;   TESTICLE BIOPSY Right 01/14/2023   Procedure: BIOPSY TESTICULAR;  Surgeon: Cristian Matar, MD;  Location: [facility];  Service: Urology;  Laterality: Right;   TONSILLECTOMY     1950s   TOTAL THYROIDECTOMY  1973   per pt april 1973 unilateral lobectomy and july 1973 total residual thyroid tissue removed   UMBILICAL HERNIA REPAIR  2005   AND VARICOSE VEIN SURGERY RIGHT LEG    Current Medications: Current Meds  Medication Sig   acetaminophen (TYLENOL) 500 MG tablet Take 500 mg by mouth every 6 (six) hours as needed for moderate pain.   amLODipine (NORVASC) 5 MG tablet Take 5 mg by mouth daily.   b complex vitamins capsule Take 1 capsule by mouth 3 (three) times a week.    Cholecalciferol (VITAMIN D3) 50 MCG (2000 UT) TABS Take 1 tablet by mouth daily after lunch.   clobetasol ointment (TEMOVATE) 0.05 % Apply 1 application  topically 2 (two) times daily as needed (irritation).   ketoconazole (NIZORAL) 2 % shampoo Apply 1 application topically every 30 (thirty) days.   levothyroxine (SYNTHROID) 50 MCG tablet Take 50 mcg by mouth every other day.   levothyroxine (SYNTHROID) 75 MCG tablet Take 75 mcg by mouth every other day.   methocarbamol (ROBAXIN) 500 MG tablet Take 500 mg by mouth every 12 (twelve) hours as needed for muscle pain. for pain   omeprazole (PRILOSEC) 20 MG capsule Take 20 mg by mouth daily.   Polyethylene Glycol 3350 (MIRALAX PO) Take by mouth at bedtime as needed.   Psyllium (METAMUCIL 4 IN 1 FIBER) 55.6 % POWD Take by mouth as needed.   Relugolix (ORGOVYX) 120 MG TABS Take 1 tablet by mouth daily after lunch.   rosuvastatin (CRESTOR) 10 MG tablet Take 10 mg  by mouth daily.   tamsulosin (FLOMAX) 0.4 MG CAPS capsule Take 1 capsule (0.4 mg total) by mouth daily after supper. (Patient taking differently: Take 0.4 mg by mouth daily after supper.  Pt takes every other day)   triamcinolone cream (KENALOG) 0.1 % Apply 1 application  topically as needed (itching).     Allergies:   Patient has no known allergies.   Social History   Socioeconomic History   Marital status: Married    Spouse name: Not on file   Number of children: Not on file   Years of education: Not on file   Highest education level: Not on file  Occupational History   Occupation: retired    Comment: Pharmacist  Tobacco Use   Smoking status: Never   Smokeless tobacco: Never  Vaping Use   Vaping Use: Never used  Substance and Sexual Activity   Alcohol use: Yes    Alcohol/week: 14.0 - 21.0 standard drinks of alcohol    Types: 14 - 21 Cans of beer per week   Drug use: Never   Sexual activity: Not on file  Other Topics Concern   Not on file  Social History Narrative    Patient is a retired Teacher, 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 and then finished out his career working for [employer] in [employer]   4 daughters 1 daughter Darra Lis is the Chiropodist in charge of short stay at [employer] [place]   Never smoker 2 beers a day no caffeine no drug use no other tobacco   Social Determinants of Corporate investment banker Strain: Not on file  Food Insecurity: Not on file  Transportation Needs: Not on file  Physical Activity: Not on file  Stress: Not on file  Social Connections: Not on file     Family History: The patient's family history includes Arthritis in his mother; Crohn's disease in his sister; Heart Problems in his maternal grandmother; Heart attack in his maternal grandfather; Hypertension in his mother; Leukemia in his brother; Skin cancer in his sister; Stroke in his father.  ROS:   Please see the history of present illness.    All other systems reviewed and are negative.  EKGs/Labs/Other Studies Reviewed:    The following studies were reviewed today: Cardiac Studies & Procedures   CARDIAC CATHETERIZATION  CARDIAC CATHETERIZATION 02/03/2021  Narrative 1.  Patent coronary arteries with mild diffuse atherosclerotic disease noted.  There is diffuse calcification of the RCA in particular.  There is mild diffuse calcification of the LAD.  There is no more than 30 to 40% stenosis noted in any of the coronary vessels. 2.  Known severe mitral regurgitation with normal right heart hemodynamics 3.  Calcified mitral annulus is noted on plain fluoroscopy.  Findings Coronary Findings Diagnostic  Dominance: Right  Left Main The left main is patent with no significant stenosis.  The vessel divides into the LAD and left circumflex.  Left Anterior Descending There is mild diffuse disease throughout the vessel. The LAD has mild diffuse disease with no evidence of high-grade stenosis.  The first diagonal is a large vessel and it is patent throughout.  Left  Circumflex There is mild diffuse disease throughout the vessel. The circumflex is patent.  It supplies a high obtuse marginal branch and 2 posterolateral branches.  The vessel has diffuse plaquing with no high-grade stenoses.  Right Coronary Artery Vessel is large. There is mild diffuse disease throughout the vessel. The vessel is moderately calcified. The right coronary artery  is a large, dominant vessel.  The proximal and mid vessel are heavily calcified.  There is 30 to 40% stenosis in the mid RCA.  The vessel is patent throughout.  There is no high-grade stenosis throughout the course of the vessel.  The PDA and PLA branches are patent without significant stenosis. Mid RCA lesion is 40% stenosed. Dist RCA lesion is 35% stenosed.  Intervention  No interventions have been documented.   STRESS TESTS  ECHOCARDIOGRAM STRESS TEST 09/12/2020  Narrative EXERCISE STRESS ECHO REPORT   --------------------------------------------------------------------------------  Patient Name:   JAQUAY LUCIO Date of Exam: 08/17/2020 Medical Rec #:  [id number]      Height:       68.0 in Accession #:    [id number]     Weight:       152.0 lb Date of Birth:  [birth year]       BSA:          1.819 m Patient Age:    74 years       BP:           168/94 mmHg Patient Gender: M              HR:           80 bpm. Exam Location:  [address]  Procedure: Stress Echo, Cardiac Doppler and Color Doppler  Indications:    I34.0  History:        Patient has prior history of Echocardiogram examinations, most recent 06/23/2020.  Sonographer:    Samule Ohm RDCS Referring Phys: [phone] Foye Haggart   Sonographer Comments: DPR [facility] 12/09/18-OK to talk to Marlinda Mike and Thermon Leyland to leave voicemail on home phone IMPRESSIONS   1. Excellent LV contractile reserve. 2. Severe mitral regurgitation at rest and stress (worsened severity during stress) with increase in estimated pulmonary pressures. 3. Thicked  mitral valve leaflets with bileaflet prolapse. 4. This is a negative stress echocardiogram for ischemia. 5. At baseline, severe mitral regurgitation was present. During peak stress, severe mitral regurgitation was present. 6. The baseline PASP was estimated to be 32.1 mmHg. The PASP increased to 45.0 mmHg at peak stress. 7. This is an intermediate risk study.  FINDINGS  Exam Protocol: The patient exercised on a treadmill according to a Bruce protocol.   Patient Performance: The patient exercised for 6 minutes and 30 seconds, achieving 8 METS. The maximum stage achieved was II of the Bruce protocol. The baseline heart rate was 86 bpm. The heart rate at peak stress was 148 bpm. The target heart rate was calculated to be 122 bpm. The percentage of maximum predicted heart rate achieved was 102.8 %. The baseline blood pressure was 168/94 mmHg. The blood pressure at peak stress was 182/89 mmHg. The blood pressure response was normal. The patient developed MR during the stress exam.  EKG: Resting EKG showed normal sinus rhythm with rare premature ventricular contractions. The patient developed occasional PAC and No ST segment changes during exercise.   2D Echo Findings: Baseline regional wall motion abnormalities were not present. There were no stress-induced wall motion abnormalities. This is a negative stress echocardiogram for ischemia.  Stress Doppler:  Mitral Regurgitation: At baseline, severe mitral regurgitation was present. During peak stress, severe mitral regurgitation was present.  Right Ventricular Systolic Pressure: The baseline PASP was estimated to be 32.1 mmHg. The PASP increased to 45.0 mmHg at peak stress.  Additional Findings: Severe mitral regurgitation at rest and stress (worsened severity during stress) with  increase in estimated pulmonary pressures. Excellent LV contractile reserve. 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   Donato Schultz MD Electronically  signed on 08/17/2020 at 5:51:48 PM      Final   ECHOCARDIOGRAM  ECHOCARDIOGRAM COMPLETE 10/26/2022  Narrative ECHOCARDIOGRAM REPORT    Patient Name:   DOMINICO CAMPAGNA Date of Exam: 10/26/2022 Medical Rec #:  [id number]      Height:       68.0 in Accession #:    [id number]     Weight:       163.4 lb Date of Birth:  May 11, 1946       BSA:          1.876 m Patient Age:    76 years       BP:           130/82 mmHg Patient Gender: M              HR:           64 bpm. Exam Location:  [address]  Procedure: 2D Echo, 3D Echo, Cardiac Doppler, Color Doppler and Strain Analysis  Indications:    I34 Mitral regurgitation  History:        Patient has prior history of Echocardiogram examinations, most recent 04/24/2022. Mitral Valve Prolapse; Risk Factors:Hypertension and Dyslipidemia.  Sonographer:    Jorje Guild BS, RDCS Referring Phys: [phone] Carylon Tamburro  IMPRESSIONS   1. Left ventricular ejection fraction, by estimation, is 50 to 55%. The left ventricle has low normal function. The left ventricle demonstrates global hypokinesis. The left ventricular internal cavity size was mildly dilated. LVIDs 4.2 cm. There is moderate asymmetric left ventricular hypertrophy of the basal-septal segment. Left ventricular diastolic parameters are indeterminate. 2. Right ventricular systolic function is normal. The right ventricular size is normal. There is normal pulmonary artery systolic pressure. The estimated right ventricular systolic pressure is 30.9 mmHg. 3. Left atrial size was severely dilated. 4. 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 Severe mitral valve regurgitation. 5. The aortic valve is tricuspid. Aortic valve regurgitation is not visualized. No aortic stenosis is present. 6. Aortic dilatation noted. There is mild dilatation of the ascending aorta, measuring 39 mm. 7. The inferior vena cava is normal in size with greater than 50% respiratory variability, suggesting right  atrial pressure of 3 mmHg.  Comparison(s): 04/24/22 EF 60-65%. Severe MR.  FINDINGS Left Ventricle: Left ventricular ejection fraction, by estimation, is 50 to 55%. The left ventricle has low normal function. The left ventricle demonstrates global hypokinesis. The left ventricular internal cavity size was mildly dilated. There is moderate asymmetric left ventricular hypertrophy of the basal-septal segment. Left ventricular diastolic parameters are indeterminate.  Right Ventricle: The right ventricular size is normal. No increase in right ventricular wall thickness. Right ventricular systolic function is normal. There is normal pulmonary artery systolic pressure. The tricuspid regurgitant velocity is 2.64 m/s, and with an assumed right atrial pressure of 3 mmHg, the estimated right ventricular systolic pressure is 30.9 mmHg.  Left Atrium: Left atrial size was severely dilated.  Right Atrium: Right atrial size was normal in size.  Pericardium: There is no evidence of pericardial effusion.  Mitral Valve: The mitral valve is myxomatous. Severe mitral valve regurgitation.  Tricuspid Valve: The tricuspid valve is normal in structure. Tricuspid valve regurgitation is trivial.  Aortic Valve: The aortic valve is tricuspid. Aortic valve regurgitation is not visualized. No aortic stenosis is present.  Pulmonic Valve: The pulmonic valve was not well  visualized. Pulmonic valve regurgitation is trivial.  Aorta: The aortic root is normal in size and structure and aortic dilatation noted. There is mild dilatation of the ascending aorta, measuring 39 mm.  Venous: The inferior vena cava is normal in size with greater than 50% respiratory variability, suggesting right atrial pressure of 3 mmHg.  IAS/Shunts: The interatrial septum was not well visualized.   LEFT VENTRICLE PLAX 2D LVIDd:         5.70 cm   Diastology LVIDs:         4.20 cm   LV e' medial:    8.08 cm/s LV PW:         1.20 cm   LV E/e'  medial:  12.9 LV IVS:        1.00 cm   LV e' lateral:   5.92 cm/s LVOT diam:     2.50 cm   LV E/e' lateral: 17.6 LV SV:         89 LV SV Index:   48        2D Longitudinal Strain LVOT Area:     4.91 cm  2D Strain GLS (A2C):   -9.5 % 2D Strain GLS (A3C):   -16.9 % 2D Strain GLS (A4C):   -12.0 % 2D Strain GLS Avg:     -12.8 %  3D Volume EF: 3D EF:        48 % LV EDV:       132 ml LV ESV:       69 ml LV SV:        63 ml  RIGHT VENTRICLE             IVC RV Basal diam:  3.60 cm     IVC diam: 1.70 cm RV S prime:     13.60 cm/s TAPSE (M-mode): 2.9 cm RVSP:           30.9 mmHg  LEFT ATRIUM              Index        RIGHT ATRIUM           Index LA diam:        5.30 cm  2.83 cm/m   RA Pressure: 3.00 mmHg LA Vol (A2C):   152.0 ml 81.04 ml/m  RA Area:     20.40 cm LA Vol (A4C):   136.0 ml 72.51 ml/m  RA Volume:   58.40 ml  31.14 ml/m LA Biplane Vol: 144.0 ml 76.78 ml/m AORTIC VALVE LVOT Vmax:   90.70 cm/s LVOT Vmean:  58.300 cm/s LVOT VTI:    0.182 m  AORTA Ao Root diam: 3.90 cm Ao Asc diam:  3.90 cm  MITRAL VALVE                  TRICUSPID VALVE TR Peak grad:   27.9 mmHg MV Decel Time: 162 msec       TR Vmax:        264.00 cm/s MR Peak grad:    160.8 mmHg   Estimated RAP:  3.00 mmHg MR Mean grad:    97.0 mmHg    RVSP:           30.9 mmHg MR Vmax:         634.00 cm/s MR Vmean:        461.0 cm/s   SHUNTS MR PISA:         1.57 cm     Systemic VTI:  0.18 m MR PISA Eff ROA: 8 mm        Systemic Diam: 2.50 cm MR PISA Radius:  0.50 cm MV E velocity: 104.00 cm/s MV A velocity: 88.80 cm/s MV E/A ratio:  1.17  Epifanio Lesches MD Electronically signed by Epifanio Lesches MD Signature Date/Time: 10/26/2022/1:26:05 PM    Final   TEE  ECHO TEE 04/13/2021  Narrative TRANSESOPHOGEAL ECHO REPORT    Patient Name:   MYRL ANNESE Date of Exam: 04/12/2021 Medical Rec #:  [id number]      Height:       67.0 in Accession #:    [id number]     Weight:       158.0  lb Date of Birth:  1946-07-27       BSA:          1.829 m Patient Age:    74 years       BP:           183/83 mmHg Patient Gender: M              HR:           68 bpm. Exam Location:  Inpatient  Procedure: 3D Echo, Transesophageal Echo, Cardiac Doppler and Color Doppler  Indications:     I34.1 Nonrheumatic mitral (valve) prolapse; I34.0 Nonrheumatic mitral (valve) insufficiency  History:         Patient has prior history of Echocardiogram examinations, most recent 06/23/2020. Mitral Valve Prolapse and Mitral Valve Disease; Risk Factors:Diabetes and Dyslipidemia. Severe mitral regurgitation.  Sonographer:     Sheralyn Boatman RDCS Referring Phys:  [id number] [facility] A Izora Ribas Diagnosing Phys: Riley Lam MD  PROCEDURE: After discussion of the risks and benefits of a TEE, an informed consent was obtained from the patient. The transesophogeal probe was passed without difficulty through the esophogus of the patient. Imaged were obtained with the patient in a left lateral decubitus position. Sedation performed by different physician. The patient was monitored while under deep sedation. Anesthestetic sedation was provided intravenously by Anesthesiology: 500mg  of Propofol. The patient developed no complications during the procedure.  IMPRESSIONS   1. Mixed Picture Mitral Regurgitation: Bilealet prolapse with a functial component with sphericity index 0.91 and planar angle 161. Coaptation gap 4.5 mm at worst. PML lenth > 9 mm. Pulmonary vein flow reversal noted. ERO 0.44 cm2. The mitral valve is abnormal. Severe mitral valve regurgitation. The mean mitral valve gradient is 1.0 mmHg. 2. Left ventricular ejection fraction, by estimation, is 55 to 60%. The left ventricle has normal function. 3. Evidence of atrial level shunting detected by color flow Doppler. There is a small patent foramen ovale. Chiari network noted and would be seen during an echo-guided transeptal puncture. 4. Right  ventricular systolic function is normal. The right ventricular size is normal. 5. Left atrial size was severely dilated. No left atrial/left atrial appendage thrombus was detected. 6. The aortic valve is normal in structure. Aortic valve regurgitation is not visualized. 7. Aortic dilatation noted. There is mild dilatation of the aortic root, measuring 40 mm. There is Moderate (Grade III) plaque involving the descending aorta.  FINDINGS Left Ventricle: Left ventricular ejection fraction, by estimation, is 55 to 60%. The left ventricle has normal function. The left ventricular internal cavity size was normal in size.  Right Ventricle: The right ventricular size is normal. No increase in right ventricular wall thickness. Right ventricular systolic function is normal.  Left Atrium: Left atrial size was severely dilated. No left  atrial/left atrial appendage thrombus was detected.  Right Atrium: Right atrial size was normal in size. Prominent Chiari network.  Pericardium: There is no evidence of pericardial effusion.  Mitral Valve: Mixed Picture Mitral Regurgitation: Bilealet prolapse with a functial component with sphericity index 0.91 and planar angle 161. Coaptation gap 4.5 mm at worst. PML lenth > 9 mm. Pulmonary vein flow reversal noted. ERO 0.44 cm2. The mitral valve is abnormal. There is severe thickening of the mitral valve leaflet(s). Severe mitral valve regurgitation. MV peak gradient, 2.0 mmHg. The mean mitral valve gradient is 1.0 mmHg.  Tricuspid Valve: The tricuspid valve is normal in structure. Tricuspid valve regurgitation is trivial.  Aortic Valve: The aortic valve is normal in structure. Aortic valve regurgitation is not visualized.  Pulmonic Valve: The pulmonic valve was grossly normal. Pulmonic valve regurgitation is not visualized.  Aorta: Aortic dilatation noted. There is mild dilatation of the aortic root, measuring 40 mm. There is moderate (Grade III) plaque involving the  descending aorta.  IAS/Shunts: Evidence of atrial level shunting detected by color flow Doppler. A small patent foramen ovale is detected.   LEFT VENTRICLE PLAX 2D LVOT diam:     2.30 cm LVOT Area:     4.15 cm   MITRAL VALVE MV Peak grad: 2.0 mmHg       SHUNTS MV Mean grad: 1.0 mmHg       Systemic Diam: 2.30 cm MV Vmax:      0.70 m/s MV Vmean:     49.1 cm/s MR Peak grad:    121.0 mmHg MR Mean grad:    74.0 mmHg MR Vmax:         550.00 cm/s MR Vmean:        398.0 cm/s MR PISA:         6.28 cm MR PISA Eff ROA: 44 mm MR PISA Radius:  1.00 cm  Riley Lam MD Electronically signed by Riley Lam MD Signature Date/Time: 04/13/2021/8:38:03 AM    Final             EKG:  EKG is not ordered today.   Recent Labs: 11/06/2022: ALT 24 11/07/2022: TSH 1.355 11/08/2022: Magnesium 1.7 12/26/2022: Platelets 215.0 01/14/2023: BUN 14; Creatinine, Ser 0.80; Hemoglobin 14.6; Potassium 3.8; Sodium 135  Recent Lipid Panel    Component Value Date/Time   CHOL 145 05/20/2019 1017   TRIG 96 05/20/2019 1017   HDL 46 05/20/2019 1017   CHOLHDL 3.2 05/20/2019 1017   LDLCALC 80 05/20/2019 1017     Risk Assessment/Calculations:                Physical Exam:    VS:  BP 130/80   Pulse 76   Ht 5\' 8"  (1.727 m)   Wt 161 lb 3.2 oz (73.1 kg)   SpO2 98%   BMI 24.51 kg/m     Wt Readings from Last 3 Encounters:  05/13/23 161 lb 3.2 oz (73.1 kg)  01/14/23 159 lb 4.8 oz (72.3 kg)  12/26/22 161 lb (73 kg)     GEN:  Well nourished, well developed in no acute distress HEENT: Normal NECK: No JVD; No carotid bruits LYMPHATICS: No lymphadenopathy CARDIAC: RRR, 2/6 systolic murmur mid-to-late peaking at the apex RESPIRATORY:  Clear to auscultation without rales, wheezing or rhonchi  ABDOMEN: Soft, non-tender, non-distended MUSCULOSKELETAL:  No edema; No deformity  SKIN: Warm and dry NEUROLOGIC:  Alert and oriented x 3 PSYCHIATRIC:  Normal affect   ASSESSMENT:     1.  Nonrheumatic mitral valve regurgitation   2. Essential hypertension   3. 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  He remains clinically stable with minimal cardiac-related symptoms.  He has just completed radiation therapy for prostate cancer.  He was referred to Dr. Leafy Nunez after his last visit because he had decremental LV function with LVEF 50 to 55%.  As he appears clinically stable, he prefers to defer on surgical intervention at this time to allow for more recovery from prostate cancer therapy.  Will update an echocardiogram to reassess LVEF and plan to see him back in 6 months as long as there is no significant change. Blood pressure control on amlodipine Treated with rosuvastatin 10 mg daily    Medication Adjustments/Labs and Tests Ordered: Current medicines are reviewed at length with the patient today.  Concerns regarding medicines are outlined above.  Orders Placed This Encounter  Procedures   ECHOCARDIOGRAM COMPLETE   No orders of the defined types were placed in this encounter.   Patient Instructions  Medication Instructions:  Your physician recommends that you continue on your current medications as directed. Please refer to the Current Medication list given to you today.  *If you need a refill on your cardiac medications before your next appointment, please call your pharmacy*   Lab Work: None If you have labs (blood work) drawn today and your tests are completely normal, you will receive your results only by: MyChart Message (if you have MyChart) OR A paper copy in the mail If you have any lab test that is abnormal or we need to change your treatment, we will call you to review the results.   Testing/Procedures: ECHO Your physician has requested that you have an echocardiogram. Echocardiography is a painless test that uses sound waves to create  images of your heart. It provides your doctor with information about the size and shape of your heart and how well your heart's chambers and valves are working. This procedure takes approximately one hour. There are no restrictions for this procedure. Please do NOT wear cologne, perfume, aftershave, or lotions (deodorant is allowed). Please arrive 15 minutes prior to your appointment time.     Follow-Up: At [facility], you and your health needs are our priority.  As part of our continuing mission to provide you with exceptional heart care, we have created designated Provider Care Teams.  These Care Teams include your primary Cardiologist (physician) and Advanced Practice Providers (APPs -  Physician Assistants and Nurse Practitioners) who all work together to provide you with the care you need, when you need it.   Your next appointment:   6 month(s)  Provider:   Tonny Bollman, MD      Signed, Cristian Bollman, MD  05/13/2023 4:11 PM    [facility]

## 2023-05-13 NOTE — Patient Instructions (Signed)
Medication Instructions:  Your physician recommends that you continue on your current medications as directed. Please refer to the Current Medication list given to you today.  *If you need a refill on your cardiac medications before your next appointment, please call your pharmacy*   Lab Work: None If you have labs (blood work) drawn today and your tests are completely normal, you will receive your results only by: MyChart Message (if you have MyChart) OR A paper copy in the mail If you have any lab test that is abnormal or we need to change your treatment, we will call you to review the results.   Testing/Procedures: ECHO Your physician has requested that you have an echocardiogram. Echocardiography is a painless test that uses sound waves to create images of your heart. It provides your doctor with information about the size and shape of your heart and how well your heart's chambers and valves are working. This procedure takes approximately one hour. There are no restrictions for this procedure. Please do NOT wear cologne, perfume, aftershave, or lotions (deodorant is allowed). Please arrive 15 minutes prior to your appointment time.     Follow-Up: At [facility], you and your health needs are our priority.  As part of our continuing mission to provide you with exceptional heart care, we have created designated Provider Care Teams.  These Care Teams include your primary Cardiologist (physician) and Advanced Practice Providers (APPs -  Physician Assistants and Nurse Practitioners) who all work together to provide you with the care you need, when you need it.   Your next appointment:   6 month(s)  Provider:   Tonny Bollman, MD

## 2023-05-16 ENCOUNTER — Other Ambulatory Visit (HOSPITAL_COMMUNITY): Payer: Medicare Other

## 2023-06-10 ENCOUNTER — Ambulatory Visit (HOSPITAL_COMMUNITY): Payer: Medicare Other | Attending: Cardiovascular Disease

## 2023-06-10 DIAGNOSIS — I340 Nonrheumatic mitral (valve) insufficiency: Secondary | ICD-10-CM | POA: Diagnosis not present

## 2023-06-10 LAB — ECHOCARDIOGRAM COMPLETE
Area-P 1/2: 3.48 cm2
MV M vel: 6.44 m/s
MV Peak grad: 165.9 mmHg
Radius: 1.15 cm
S' Lateral: 3.8 cm

## 2023-07-03 DIAGNOSIS — C61 Malignant neoplasm of prostate: Secondary | ICD-10-CM | POA: Diagnosis not present

## 2023-07-03 DIAGNOSIS — Z6824 Body mass index (BMI) 24.0-24.9, adult: Secondary | ICD-10-CM | POA: Diagnosis not present

## 2023-07-03 DIAGNOSIS — D62 Acute posthemorrhagic anemia: Secondary | ICD-10-CM | POA: Diagnosis not present

## 2023-07-03 DIAGNOSIS — E785 Hyperlipidemia, unspecified: Secondary | ICD-10-CM | POA: Diagnosis not present

## 2023-07-03 DIAGNOSIS — K219 Gastro-esophageal reflux disease without esophagitis: Secondary | ICD-10-CM | POA: Diagnosis not present

## 2023-07-03 DIAGNOSIS — I340 Nonrheumatic mitral (valve) insufficiency: Secondary | ICD-10-CM | POA: Diagnosis not present

## 2023-07-03 DIAGNOSIS — E039 Hypothyroidism, unspecified: Secondary | ICD-10-CM | POA: Diagnosis not present

## 2023-07-03 DIAGNOSIS — I10 Essential (primary) hypertension: Secondary | ICD-10-CM | POA: Diagnosis not present

## 2023-08-21 DIAGNOSIS — Z0000 Encounter for general adult medical examination without abnormal findings: Secondary | ICD-10-CM | POA: Diagnosis not present

## 2023-08-21 DIAGNOSIS — Z9181 History of falling: Secondary | ICD-10-CM | POA: Diagnosis not present

## 2023-08-22 DIAGNOSIS — C61 Malignant neoplasm of prostate: Secondary | ICD-10-CM | POA: Diagnosis not present

## 2023-10-04 DIAGNOSIS — Z23 Encounter for immunization: Secondary | ICD-10-CM | POA: Diagnosis not present

## 2023-10-23 DIAGNOSIS — Z6824 Body mass index (BMI) 24.0-24.9, adult: Secondary | ICD-10-CM | POA: Diagnosis not present

## 2023-10-23 DIAGNOSIS — U071 COVID-19: Secondary | ICD-10-CM | POA: Diagnosis not present

## 2023-10-23 DIAGNOSIS — J069 Acute upper respiratory infection, unspecified: Secondary | ICD-10-CM | POA: Diagnosis not present

## 2023-11-15 ENCOUNTER — Ambulatory Visit: Payer: Medicare Other | Admitting: Cardiovascular Disease

## 2023-11-19 DIAGNOSIS — L400 Psoriasis vulgaris: Secondary | ICD-10-CM | POA: Diagnosis not present

## 2023-11-19 DIAGNOSIS — L821 Other seborrheic keratosis: Secondary | ICD-10-CM | POA: Diagnosis not present

## 2023-11-19 DIAGNOSIS — L219 Seborrheic dermatitis, unspecified: Secondary | ICD-10-CM | POA: Diagnosis not present

## 2023-11-19 DIAGNOSIS — L300 Nummular dermatitis: Secondary | ICD-10-CM | POA: Diagnosis not present

## 2023-11-22 ENCOUNTER — Telehealth: Payer: Self-pay | Admitting: *Deleted

## 2023-11-22 NOTE — Telephone Encounter (Signed)
[place] April daughter Edwina Barth [phone]) to form staff office with [employer] form.  Provided [facility] Authorization form.  Reviewed [facility] Form process.  "ABDIAS AREY has completed radiation treatments.  Will see Dad and return this Monday."  Denies further questions or needs.

## 2023-12-16 ENCOUNTER — Telehealth: Payer: Self-pay

## 2023-12-16 NOTE — Telephone Encounter (Signed)
 Notified patient that his disability documents had been completed and mailed back to company. As requested. Request for records faxed to [employer]. Copy of documents mailed to patient per request.

## 2023-12-18 ENCOUNTER — Ambulatory Visit: Payer: Medicare Other | Admitting: Cardiovascular Disease

## 2023-12-24 DIAGNOSIS — H26492 Other secondary cataract, left eye: Secondary | ICD-10-CM | POA: Diagnosis not present

## 2023-12-24 DIAGNOSIS — Z961 Presence of intraocular lens: Secondary | ICD-10-CM | POA: Diagnosis not present

## 2023-12-24 DIAGNOSIS — H524 Presbyopia: Secondary | ICD-10-CM | POA: Diagnosis not present

## 2023-12-24 DIAGNOSIS — H43393 Other vitreous opacities, bilateral: Secondary | ICD-10-CM | POA: Diagnosis not present

## 2023-12-26 ENCOUNTER — Encounter: Payer: Self-pay | Admitting: Cardiovascular Disease

## 2023-12-26 ENCOUNTER — Ambulatory Visit: Payer: Medicare Other | Attending: Cardiovascular Disease | Admitting: Cardiovascular Disease

## 2023-12-26 VITALS — BP 150/90 | HR 74 | Ht 68.0 in | Wt 167.2 lb

## 2023-12-26 DIAGNOSIS — E782 Mixed hyperlipidemia: Secondary | ICD-10-CM | POA: Diagnosis not present

## 2023-12-26 DIAGNOSIS — I10 Essential (primary) hypertension: Secondary | ICD-10-CM | POA: Diagnosis not present

## 2023-12-26 DIAGNOSIS — I340 Nonrheumatic mitral (valve) insufficiency: Secondary | ICD-10-CM | POA: Diagnosis not present

## 2023-12-26 NOTE — Progress Notes (Signed)
Cardiology Office Note:    Date:  12/26/2023   ID:  Cristian Nunez, DOB 1946/07/23, MRN [id number]  PCP:  Paulina Fusi, MD   [facility] Providers Cardiologist:  Tonny Bollman, MD     Referring MD: Paulina Fusi, MD   Chief Complaint  Patient presents with   Mitral Regurgitation    History of Present Illness:    Cristian Nunez is a 78 y.o. male presenting for follow-up evaluation.  The patient has been followed for longstanding severe mitral regurgitation.  He has undergone surgical evaluation and recommendations been made for continued observation and medical therapy in the context of his comorbid medical conditions.  The patient has a history of GI bleeding and prostate cancer.  The patient is here alone today.  He had a respiratory infection in October and ended up testing positive for COVID.  He has recovered well and has no specific complaints today.  He denies chest pain, chest pressure, shortness of breath, edema, or heart palpitations.  He has not been as active as he was in the past, partly due to the bad weather.  He has completed radiation therapy for his prostate cancer and is doing well in that regard.  He states that his last PSA was undetectable.  Current Medications: Current Meds  Medication Sig   acetaminophen (TYLENOL) 500 MG tablet Take 500 mg by mouth every 6 (six) hours as needed for moderate pain.   amLODipine (NORVASC) 5 MG tablet Take 5 mg by mouth daily.   b complex vitamins capsule Take 1 capsule by mouth 3 (three) times a week.   Cholecalciferol (VITAMIN D3) 50 MCG (2000 UT) TABS Take 1 tablet by mouth as needed.   clobetasol ointment (TEMOVATE) 0.05 % Apply 1 application  topically 2 (two) times daily as needed (irritation).   ketoconazole (NIZORAL) 2 % shampoo Apply 1 application  topically as needed.   levothyroxine (SYNTHROID) 50 MCG tablet Take 50 mcg by mouth every other day.   levothyroxine (SYNTHROID) 75 MCG tablet Take 75 mcg  by mouth every other day.   methocarbamol (ROBAXIN) 500 MG tablet Take 500 mg by mouth every 12 (twelve) hours as needed for muscle pain. for pain   omeprazole (PRILOSEC) 20 MG capsule Take 20 mg by mouth daily.   Polyethylene Glycol 3350 (MIRALAX PO) Take by mouth at bedtime as needed.   Psyllium (METAMUCIL 4 IN 1 FIBER) 55.6 % POWD Take by mouth as needed.   rosuvastatin (CRESTOR) 10 MG tablet Take 10 mg by mouth daily.   triamcinolone cream (KENALOG) 0.1 % Apply 1 application  topically as needed (itching).   [DISCONTINUED] Relugolix (ORGOVYX) 120 MG TABS Take 1 tablet by mouth daily after lunch.   [DISCONTINUED] tamsulosin (FLOMAX) 0.4 MG CAPS capsule Take 1 capsule (0.4 mg total) by mouth daily after supper. (Patient taking differently: Take 0.4 mg by mouth daily after supper.  Pt takes every other day)     Allergies:   Patient has no known allergies.   ROS:   Please see the history of present illness.    All other systems reviewed and are negative.  EKGs/Labs/Other Studies Reviewed:    The following studies were reviewed today: Cardiac Studies & Procedures   CARDIAC CATHETERIZATION  CARDIAC CATHETERIZATION 02/03/2021  Narrative 1.  Patent coronary arteries with mild diffuse atherosclerotic disease noted.  There is diffuse calcification of the RCA in particular.  There is mild diffuse calcification of the LAD.  There  is no more than 30 to 40% stenosis noted in any of the coronary vessels. 2.  Known severe mitral regurgitation with normal right heart hemodynamics 3.  Calcified mitral annulus is noted on plain fluoroscopy.  Findings Coronary Findings Diagnostic  Dominance: Right  Left Main The left main is patent with no significant stenosis.  The vessel divides into the LAD and left circumflex.  Left Anterior Descending There is mild diffuse disease throughout the vessel. The LAD has mild diffuse disease with no evidence of high-grade stenosis.  The first diagonal is a large  vessel and it is patent throughout.  Left Circumflex There is mild diffuse disease throughout the vessel. The circumflex is patent.  It supplies a high obtuse marginal branch and 2 posterolateral branches.  The vessel has diffuse plaquing with no high-grade stenoses.  Right Coronary Artery Vessel is large. There is mild diffuse disease throughout the vessel. The vessel is moderately calcified. The right coronary artery is a large, dominant vessel.  The proximal and mid vessel are heavily calcified.  There is 30 to 40% stenosis in the mid RCA.  The vessel is patent throughout.  There is no high-grade stenosis throughout the course of the vessel.  The PDA and PLA branches are patent without significant stenosis. Mid RCA lesion is 40% stenosed. Dist RCA lesion is 35% stenosed.  Intervention  No interventions have been documented.   STRESS TESTS  ECHOCARDIOGRAM STRESS TEST 08/17/2020  Narrative EXERCISE STRESS ECHO REPORT   --------------------------------------------------------------------------------  Patient Name:   Cristian Nunez Date of Exam: 08/17/2020 Medical Rec #:  [id number]      Height:       68.0 in Accession #:    [id number]     Weight:       152.0 lb Date of Birth:  25-Apr-1946       BSA:          1.819 m Patient Age:    74 years       BP:           168/94 mmHg Patient Gender: M              HR:           80 bpm. Exam Location:  [address]  Procedure: Stress Echo, Cardiac Doppler and Color Doppler  Indications:    I34.0  History:        Patient has prior history of Echocardiogram examinations, most recent 06/23/2020.  Sonographer:    Samule Ohm RDCS Referring Phys: [phone] Hester Forget   Sonographer Comments: [facility] 12/09/18-OK to talk to Marlinda Mike and Thermon Leyland to leave voicemail on home phone IMPRESSIONS   1. Excellent LV contractile reserve. 2. Severe mitral regurgitation at rest and stress (worsened severity during stress) with increase in  estimated pulmonary pressures. 3. 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 4. This is a negative stress echocardiogram for ischemia. 5. At baseline, severe mitral regurgitation was present. During peak stress, severe mitral regurgitation was present. 6. The baseline PASP was estimated to be 32.1 mmHg. The PASP increased to 45.0 mmHg at peak stress. 7. This is an intermediate risk study.  FINDINGS  Exam Protocol: The patient exercised on a treadmill according to a Bruce protocol.   Patient Performance: The patient exercised for 6 minutes and 30 seconds, achieving 8 METS. The maximum stage achieved was II of the Bruce protocol. The baseline heart rate was 86 bpm. The heart rate at peak stress was  148 bpm. The target heart rate was calculated to be 122 bpm. The percentage of maximum predicted heart rate achieved was 102.8 %. The baseline blood pressure was 168/94 mmHg. The blood pressure at peak stress was 182/89 mmHg. The blood pressure response was normal. The patient developed MR during the stress exam.  EKG: Resting EKG showed normal sinus rhythm with rare premature ventricular contractions. The patient developed occasional PAC and No ST segment changes during exercise.   2D Echo Findings: Baseline regional wall motion abnormalities were not present. There were no stress-induced wall motion abnormalities. This is a negative stress echocardiogram for ischemia.  Stress Doppler:  Mitral Regurgitation: At baseline, severe mitral regurgitation was present. During peak stress, severe mitral regurgitation was present.  Right Ventricular Systolic Pressure: The baseline PASP was estimated to be 32.1 mmHg. The PASP increased to 45.0 mmHg at peak stress.  Additional Findings: Severe mitral regurgitation at rest and stress (worsened severity during stress) with increase in estimated pulmonary pressures. Excellent LV contractile reserve. Thicked mitral valve leaflets with bileaflet  prolapse.   Donato Schultz MD Electronically signed on 08/17/2020 at 5:51:48 PM      Final  ECHOCARDIOGRAM  ECHOCARDIOGRAM COMPLETE 06/10/2023  Narrative ECHOCARDIOGRAM REPORT    Patient Name:   Cristian Nunez Date of Exam: 06/10/2023 Medical Rec #:  [id number]      Height:       68.0 in Accession #:    [id number]     Weight:       161.2 lb Date of Birth:  15-Oct-1946       BSA:          1.865 m Patient Age:    76 years       BP:           130/80 mmHg Patient Gender: M              HR:           57 bpm. Exam Location:  [address]  Procedure: 2D Echo, Cardiac Doppler, Color Doppler and 3D Echo  Indications:    Mitral Regurgitation I34.0  History:        Patient has prior history of Echocardiogram examinations, most recent 10/26/2022. Risk Factors:Hypertension and Dyslipidemia.  Sonographer:    Thurman Coyer RDCS Referring Phys: [phone] Sharnell Knight  IMPRESSIONS   1. Left ventricular ejection fraction, by estimation, is 50 to 55%. The left ventricle has low normal function. The left ventricle has no regional wall motion abnormalities. The left ventricular internal cavity size was mildly dilated. Left ventricular diastolic parameters are indeterminate. 2. Right ventricular systolic function is normal. The right ventricular size is normal. There is normal pulmonary artery systolic pressure. 3. Left atrial size was severely dilated. 4. 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 The mitral valve is myxomatous. No evidence of mitral valve regurgitation. No evidence of mitral stenosis. 5. The aortic valve is tricuspid. Aortic valve regurgitation is not visualized. No aortic stenosis is present. 6. Aortic dilatation noted. There is mild dilatation of the aortic root, measuring 40 mm. There is dilatation of the ascending aorta, measuring 40 mm. 7. The inferior vena cava is normal in size with greater  than 50% respiratory variability, suggesting right atrial pressure of 3 mmHg.  FINDINGS Left Ventricle: Left ventricular ejection fraction, by estimation, is 50 to 55%. The left ventricle has low normal function. The  left ventricle has no regional wall motion abnormalities. The left ventricular internal cavity size was mildly dilated. There is no left ventricular hypertrophy. Left ventricular diastolic parameters are indeterminate.  Right Ventricle: The right ventricular size is normal. No increase in right ventricular wall thickness. Right ventricular systolic function is normal. There is normal pulmonary artery systolic pressure. The tricuspid regurgitant velocity is 2.21 m/s, and with an assumed right atrial pressure of 3 mmHg, the estimated right ventricular systolic pressure is 22.5 mmHg.  Left Atrium: Left atrial size was severely dilated.  Right Atrium: Right atrial size was normal in size.  Pericardium: There is no evidence of pericardial effusion.  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 The mitral valve is myxomatous. There is moderate thickening of the mitral valve leaflet(s). There is mild calcification of the mitral valve leaflet(s). No evidence of mitral valve regurgitation. No evidence of mitral valve stenosis.  Tricuspid Valve: The tricuspid valve is normal in structure. Tricuspid valve regurgitation is mild . No evidence of tricuspid stenosis.  Aortic Valve: The aortic valve is tricuspid. Aortic valve regurgitation is not visualized. No aortic stenosis is present.  Pulmonic Valve: The pulmonic valve was normal in structure. Pulmonic valve regurgitation is mild. No evidence of pulmonic stenosis.  Aorta: Aortic dilatation noted. There is mild dilatation of the aortic root, measuring 40 mm. There is dilatation of the ascending aorta, measuring 40 mm.  Venous: The inferior vena  cava is normal in size with greater than 50% respiratory variability, suggesting right atrial pressure of 3 mmHg.  IAS/Shunts: No atrial level shunt detected by color flow Doppler.   LEFT VENTRICLE PLAX 2D LVIDd:         5.20 cm   Diastology LVIDs:         3.80 cm   LV e' medial:    6.57 cm/s LV PW:         1.00 cm   LV E/e' medial:  15.2 LV IVS:        1.00 cm   LV e' lateral:   6.00 cm/s LVOT diam:     2.50 cm   LV E/e' lateral: 16.6 LV SV:         108 LV SV Index:   58 LVOT Area:     4.91 cm  3D Volume EF: 3D EF:        51 % LV EDV:       169 ml LV ESV:       82 ml LV SV:        86 ml  RIGHT VENTRICLE RV Basal diam:  3.90 cm RV Mid diam:    3.50 cm RV S prime:     10.50 cm/s TAPSE (M-mode): 2.6 cm  LEFT ATRIUM              Index        RIGHT ATRIUM           Index LA diam:        5.50 cm  2.95 cm/m   RA Area:     17.60 cm LA Vol (A2C):   154.0 ml 82.59 ml/m  RA Volume:   42.60 ml  22.84 ml/m LA Vol (A4C):   121.0 ml 64.89 ml/m LA Biplane Vol: 139.0 ml 74.54 ml/m AORTIC VALVE LVOT Vmax:   93.80 cm/s LVOT Vmean:  63.300 cm/s LVOT VTI:  0.220 m  AORTA Ao Root diam: 4.00 cm Ao Asc diam:  3.85 cm  MITRAL VALVE                  TRICUSPID VALVE MV Area (PHT): 3.48 cm       TR Peak grad:   19.5 mmHg MV Decel Time: 218 msec       TR Vmax:        221.00 cm/s MR Peak grad:    165.9 mmHg MR Mean grad:    99.0 mmHg    SHUNTS MR Vmax:         644.00 cm/s  Systemic VTI:  0.22 m MR Vmean:        457.0 cm/s   Systemic Diam: 2.50 cm MR PISA:         8.31 cm MR PISA Eff ROA: 41 mm MR PISA Radius:  1.15 cm MV E velocity: 99.90 cm/s MV A velocity: 103.00 cm/s MV E/A ratio:  0.97  Charlton Haws MD Electronically signed by Charlton Haws MD Signature Date/Time: 06/10/2023/4:58:59 PM    Final  TEE  ECHO TEE 04/12/2021  Narrative TRANSESOPHOGEAL ECHO REPORT    Patient Name:   Cristian Nunez Date of Exam: 04/12/2021 Medical Rec #:  [id number]      Height:        67.0 in Accession #:    [id number]     Weight:       158.0 lb Date of Birth:  August 26, 1946       BSA:          1.829 m Patient Age:    74 years       BP:           183/83 mmHg Patient Gender: M              HR:           68 bpm. Exam Location:  Inpatient  Procedure: 3D Echo, Transesophageal Echo, Cardiac Doppler and Color Doppler  Indications:     I34.1 Nonrheumatic mitral (valve) prolapse; I34.0 Nonrheumatic mitral (valve) insufficiency  History:         Patient has prior history of Echocardiogram examinations, most recent 06/23/2020. Mitral Valve Prolapse and Mitral Valve Disease; Risk Factors:Diabetes and Dyslipidemia. Severe mitral regurgitation.  Sonographer:     Sheralyn Boatman RDCS Referring Phys:  [id number] [facility] A Izora Ribas Diagnosing Phys: Riley Lam MD  PROCEDURE: After discussion of the risks and benefits of a TEE, an informed consent was obtained from the patient. The transesophogeal probe was passed without difficulty through the esophogus of the patient. Imaged were obtained with the patient in a left lateral decubitus position. Sedation performed by different physician. The patient was monitored while under deep sedation. Anesthestetic sedation was provided intravenously by Anesthesiology: 500mg  of Propofol. The patient developed no complications during the procedure.  IMPRESSIONS   1. Mixed Picture Mitral Regurgitation: Bilealet prolapse with a functial component with sphericity index 0.91 and planar angle 161. Coaptation gap 4.5 mm at worst. PML lenth > 9 mm. Pulmonary vein flow reversal noted. ERO 0.44 cm2. The mitral valve is abnormal. Severe mitral valve regurgitation. The mean mitral valve gradient is 1.0 mmHg. 2. Left ventricular ejection fraction, by estimation, is 55 to 60%. The left ventricle has normal function. 3. Evidence of atrial level shunting detected by color flow Doppler. There is a small patent foramen ovale. Chiari network noted and would be seen  during an echo-guided transeptal puncture.  4. Right ventricular systolic function is normal. The right ventricular size is normal. 5. Left atrial size was severely dilated. No left atrial/left atrial appendage thrombus was detected. 6. The aortic valve is normal in structure. Aortic valve regurgitation is not visualized. 7. Aortic dilatation noted. There is mild dilatation of the aortic root, measuring 40 mm. There is Moderate (Grade III) plaque involving the descending aorta.  FINDINGS Left Ventricle: Left ventricular ejection fraction, by estimation, is 55 to 60%. The left ventricle has normal function. The left ventricular internal cavity size was normal in size.  Right Ventricle: The right ventricular size is normal. No increase in right ventricular wall thickness. Right ventricular systolic function is normal.  Left Atrium: Left atrial size was severely dilated. No left atrial/left atrial appendage thrombus was detected.  Right Atrium: Right atrial size was normal in size. Prominent Chiari network.  Pericardium: There is no evidence of pericardial effusion.  Mitral Valve: Mixed Picture Mitral Regurgitation: Bilealet prolapse with a functial component with sphericity index 0.91 and planar angle 161. Coaptation gap 4.5 mm at worst. PML lenth > 9 mm. Pulmonary vein flow reversal noted. ERO 0.44 cm2. The mitral valve is abnormal. There is severe thickening of the mitral valve leaflet(s). Severe mitral valve regurgitation. MV peak gradient, 2.0 mmHg. The mean mitral valve gradient is 1.0 mmHg.  Tricuspid Valve: The tricuspid valve is normal in structure. Tricuspid valve regurgitation is trivial.  Aortic Valve: The aortic valve is normal in structure. Aortic valve regurgitation is not visualized.  Pulmonic Valve: The pulmonic valve was grossly normal. Pulmonic valve regurgitation is not visualized.  Aorta: Aortic dilatation noted. There is mild dilatation of the aortic root, measuring 40 mm.  There is moderate (Grade III) plaque involving the descending aorta.  IAS/Shunts: Evidence of atrial level shunting detected by color flow Doppler. A small patent foramen ovale is detected.   LEFT VENTRICLE PLAX 2D LVOT diam:     2.30 cm LVOT Area:     4.15 cm   MITRAL VALVE MV Peak grad: 2.0 mmHg       SHUNTS MV Mean grad: 1.0 mmHg       Systemic Diam: 2.30 cm MV Vmax:      0.70 m/s MV Vmean:     49.1 cm/s MR Peak grad:    121.0 mmHg MR Mean grad:    74.0 mmHg MR Vmax:         550.00 cm/s MR Vmean:        398.0 cm/s MR PISA:         6.28 cm MR PISA Eff ROA: 44 mm MR PISA Radius:  1.00 cm  Riley Lam MD Electronically signed by Riley Lam MD Signature Date/Time: 04/13/2021/8:38:03 AM    Final            EKG:   EKG Interpretation Date/Time:  Thursday December 26 2023 11:08:09 EST Ventricular Rate:  74 PR Interval:  264 QRS Duration:  86 QT Interval:  390 QTC Calculation: 432 R Axis:   7  Text Interpretation: Sinus rhythm with sinus arrhythmia with 1st degree A-V block with frequent Premature ventricular complexes Minimal voltage criteria for LVH, may be normal variant ( Sokolow-Lyon ) When compared with ECG of 06-Nov-2022 06:25, PREVIOUS ECG IS PRESENTPVC's are more frequent no other significant changes Confirmed by Tonny Bollman [phone]) on 12/26/2023 11:19:04 AM    Recent Labs: 01/14/2023: BUN 14; Creatinine, Ser 0.80; Hemoglobin 14.6; Potassium 3.8; Sodium 135  Recent Lipid Panel  Component Value Date/Time   CHOL 145 05/20/2019 1017   TRIG 96 05/20/2019 1017   HDL 46 05/20/2019 1017   CHOLHDL 3.2 05/20/2019 1017   LDLCALC 80 05/20/2019 1017         Physical Exam:    VS:  BP (!) 150/90   Pulse 74   Ht 5\' 8"  (1.727 m)   Wt 167 lb 3.2 oz (75.8 kg)   SpO2 98%   BMI 25.42 kg/m     Wt Readings from Last 3 Encounters:  12/26/23 167 lb 3.2 oz (75.8 kg)  05/13/23 161 lb 3.2 oz (73.1 kg)  01/14/23 159 lb 4.8 oz (72.3 kg)     GEN:   Well nourished, well developed in no acute distress HEENT: Normal NECK: No JVD; No carotid bruits LYMPHATICS: No lymphadenopathy CARDIAC: RRR, 3/6 holosystolic murmur loudest in the left axilla RESPIRATORY:  Clear to auscultation without rales, wheezing or rhonchi  ABDOMEN: Soft, non-tender, non-distended MUSCULOSKELETAL:  No edema; No deformity  SKIN: Warm and dry NEUROLOGIC:  Alert and oriented x 3 PSYCHIATRIC:  Normal affect   Assessment & Plan Essential hypertension Blood pressure elevated today but he states this is very unusual for him.  Past readings have been under good range.  He will continue on amlodipine at the current dose.  Will like him to keep an eye on his blood pressure and notify me if his readings are consistently elevated greater than 130/80 mmHg. 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  I reviewed the notes of Dr. Karolee Ohs surgical evaluation and he will require complex repair and may require a sternotomy.  The patient has had issues last year with prostate cancer and GI bleeding.  He has marked kyphosis.  It sounds like he will probably require a sternotomy for his valve surgery when the time comes.  We discussed options today at length.  Through shared decision making model, he elects to continue with observation and return in 6 months with an echocardiogram before his visit. Mixed hyperlipidemia Treated with rosuvastatin.  Last labs reviewed with cholesterol 161, HDL 50, LDL 92.      Medication Adjustments/Labs and Tests Ordered: Current medicines are reviewed at length with the patient today.  Concerns regarding medicines are outlined above.  Orders Placed This Encounter  Procedures   EKG 12-Lead   No orders of the defined types were placed in this encounter.   There are no Patient Instructions on file for this visit.   Signed, Tonny Bollman, MD  12/26/2023 11:26 AM    [facility]

## 2023-12-26 NOTE — Assessment & Plan Note (Signed)
Blood pressure elevated today but he states this is very unusual for him.  Past readings have been under good range.  He will continue on amlodipine at the current dose.  Will like him to keep an eye on his blood pressure and notify me if his readings are consistently elevated greater than 130/80 mmHg.

## 2023-12-26 NOTE — Assessment & Plan Note (Signed)
Treated with rosuvastatin.  Last labs reviewed with cholesterol 161, HDL 50, LDL 92.

## 2023-12-26 NOTE — Assessment & Plan Note (Signed)
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  I reviewed the notes of Dr. Karolee Ohs surgical evaluation and he will require complex repair and may require a sternotomy.  The patient has had issues last year with prostate cancer and GI bleeding.  He has marked kyphosis.  It sounds like he will probably require a sternotomy for his valve surgery when the time comes.  We discussed options today at length.  Through shared decision making model, he elects to continue with observation and return in 6 months with an echocardiogram before his visit.

## 2023-12-26 NOTE — Patient Instructions (Signed)
Testing/Procedures: ECHO (in 6 months, prior to visit) Your physician has requested that you have an echocardiogram. Echocardiography is a painless test that uses sound waves to create images of your heart. It provides your doctor with information about the size and shape of your heart and how well your heart's chambers and valves are working. This procedure takes approximately one hour. There are no restrictions for this procedure. Please do NOT wear cologne, perfume, aftershave, or lotions (deodorant is allowed). Please arrive 15 minutes prior to your appointment time.  Please note: We ask at that you not bring children with you during ultrasound (echo/ vascular) testing. Due to room size and safety concerns, children are not allowed in the ultrasound rooms during exams. Our front office staff cannot provide observation of children in our lobby area while testing is being conducted. An adult accompanying a patient to their appointment will only be allowed in the ultrasound room at the discretion of the ultrasound technician under special circumstances. We apologize for any inconvenience.  Follow-Up: At [facility], you and your health needs are our priority.  As part of our continuing mission to provide you with exceptional heart care, we have created designated Provider Care Teams.  These Care Teams include your primary Cardiologist (physician) and Advanced Practice Providers (APPs -  Physician Assistants and Nurse Practitioners) who all work together to provide you with the care you need, when you need it.  Your next appointment:   6 month(s)  Provider:   Tonny Bollman, MD        1st Floor: - Lobby - Registration  - Pharmacy  - Lab - Cafe  2nd Floor: - PV Lab - Diagnostic Testing (echo, CT, nuclear med)  3rd Floor: - Vacant  4th Floor: - TCTS (cardiothoracic surgery) - AFib Clinic - Structural Heart Clinic - Vascular Surgery  - Vascular Ultrasound  5th Floor: -  [facility] (general and EP) - Clinical Pharmacy for coumadin, hypertension, lipid, weight-loss medications, and med management appointments    Valet parking services will be available as well.

## 2024-01-08 DIAGNOSIS — E785 Hyperlipidemia, unspecified: Secondary | ICD-10-CM | POA: Diagnosis not present

## 2024-01-08 DIAGNOSIS — I10 Essential (primary) hypertension: Secondary | ICD-10-CM | POA: Diagnosis not present

## 2024-01-08 DIAGNOSIS — C61 Malignant neoplasm of prostate: Secondary | ICD-10-CM | POA: Diagnosis not present

## 2024-01-08 DIAGNOSIS — I340 Nonrheumatic mitral (valve) insufficiency: Secondary | ICD-10-CM | POA: Diagnosis not present

## 2024-01-08 DIAGNOSIS — E039 Hypothyroidism, unspecified: Secondary | ICD-10-CM | POA: Diagnosis not present

## 2024-01-08 DIAGNOSIS — K219 Gastro-esophageal reflux disease without esophagitis: Secondary | ICD-10-CM | POA: Diagnosis not present

## 2024-01-08 DIAGNOSIS — D62 Acute posthemorrhagic anemia: Secondary | ICD-10-CM | POA: Diagnosis not present

## 2024-02-25 DIAGNOSIS — C61 Malignant neoplasm of prostate: Secondary | ICD-10-CM | POA: Diagnosis not present

## 2024-02-25 DIAGNOSIS — Z23 Encounter for immunization: Secondary | ICD-10-CM | POA: Diagnosis not present

## 2024-03-02 DIAGNOSIS — C61 Malignant neoplasm of prostate: Secondary | ICD-10-CM | POA: Diagnosis not present

## 2024-03-19 DIAGNOSIS — H43393 Other vitreous opacities, bilateral: Secondary | ICD-10-CM | POA: Diagnosis not present

## 2024-03-19 DIAGNOSIS — H16223 Keratoconjunctivitis sicca, not specified as Sjogren's, bilateral: Secondary | ICD-10-CM | POA: Diagnosis not present

## 2024-03-19 DIAGNOSIS — Z961 Presence of intraocular lens: Secondary | ICD-10-CM | POA: Diagnosis not present

## 2024-03-19 DIAGNOSIS — H524 Presbyopia: Secondary | ICD-10-CM | POA: Diagnosis not present

## 2024-03-19 DIAGNOSIS — H26492 Other secondary cataract, left eye: Secondary | ICD-10-CM | POA: Diagnosis not present

## 2024-03-19 DIAGNOSIS — H18413 Arcus senilis, bilateral: Secondary | ICD-10-CM | POA: Diagnosis not present

## 2024-04-13 ENCOUNTER — Encounter: Payer: Self-pay | Admitting: *Deleted

## 2024-04-13 ENCOUNTER — Telehealth: Payer: Self-pay | Admitting: *Deleted

## 2024-04-13 NOTE — Telephone Encounter (Signed)
 Cristian Nunez's spouse Devra Fontana called [phone]).  "I gave Dara a packet to send and the insurance company said they have not received it.  I do not have a copy of anything.  "  12/16/2023 phone note per Annette Barters., CMA the information was mailed.  Asked to clarify if cancer claim or records were not received.    Per [employer] releases the request was  ineligible, an authorization is needed and they do not process records for Radiation Oncology.   .   Advised she needed to send itemized billing and connect with [facility] for records. Provided patient accounting number [phone].  "Do they accept EOB's?"  Advised to ask [employer]/ [employer].   "I'll call [employer].  They transfer me to [employer].  I've been paying on this policy for 40-years.  I'll just bring a new packet in and we can start over." Denies further questions or needs.  Copy for cancer claim information sent through patient portal.  Will Send HIPAA Authorization for him to [employer].

## 2024-05-19 ENCOUNTER — Telehealth: Payer: Self-pay | Admitting: *Deleted

## 2024-05-19 NOTE — Telephone Encounter (Signed)
 Cover Sheet and signed HIPAA Authorization delivered to radiation Administrative office for record release for a cancer Policy for Cristian Nunez.  Advised of expecting a visit Monday 05/25/2024 with something on letter head to assist.  Returned call to patient, connected with spouse Cristian Nunez.  Confirmed receipt of HIPAA Authorization through [employer].  (See 04/13/2024 telephone encounter)   Asked if she had learned who Claims Benefit Manager is or has received anything on letterhead requesting records.  "They need all office notes and radiation treatment information.  I'll look for a copy of something and bring it to you Monday."

## 2024-06-01 NOTE — Telephone Encounter (Signed)
 Returned today after being Out of Office since close of business 05/28/2024 to read following e-mail in employee email.   Logged this request to [employer]. SABRA 05/29/2024 3:31 pm: Lavell Males  [place], Quorra [email or web address] Good afternoon, I am sorry I missed you today. I have dropped off the cancer policy paperwork packet for Cristian Nunez 07/31/1946 that you had previously discussed with my mother Cristian Nunez. I gave it to one of your coworkers and she placed it on your desk face down. Feel free to give me  a call if you have any questions thank you so much.  Males Lavell MSN, RN , CNOR  Nurse Manager  [facility] / [facility] Direct dial / text [phone]

## 2024-06-08 ENCOUNTER — Other Ambulatory Visit (HOSPITAL_COMMUNITY): Payer: Medicare Other

## 2024-07-02 ENCOUNTER — Encounter: Payer: Self-pay | Admitting: Cardiovascular Disease

## 2024-07-02 ENCOUNTER — Ambulatory Visit (HOSPITAL_COMMUNITY)
Admission: RE | Admit: 2024-07-02 | Discharge: 2024-07-02 | Disposition: A | Discharge status: Home / Self Care | Source: Ambulatory Visit | Attending: Cardiovascular Disease | Admitting: Cardiovascular Disease

## 2024-07-02 ENCOUNTER — Ambulatory Visit: Admitting: Cardiovascular Disease

## 2024-07-02 VITALS — BP 157/80 | HR 68 | Ht 68.0 in | Wt 170.0 lb

## 2024-07-02 DIAGNOSIS — E782 Mixed hyperlipidemia: Secondary | ICD-10-CM | POA: Diagnosis not present

## 2024-07-02 DIAGNOSIS — I10 Essential (primary) hypertension: Secondary | ICD-10-CM | POA: Diagnosis not present

## 2024-07-02 DIAGNOSIS — I340 Nonrheumatic mitral (valve) insufficiency: Secondary | ICD-10-CM

## 2024-07-02 LAB — ECHOCARDIOGRAM COMPLETE
AR max vel: 2.87 cm2
AV Area VTI: 2.85 cm2
AV Area mean vel: 2.74 cm2
AV Mean grad: 3 mmHg
AV Peak grad: 5.4 mmHg
Ao pk vel: 1.17 m/s
Area-P 1/2: 3.48 cm2
MV M vel: 6.42 m/s
MV Peak grad: 165 mmHg
MV VTI: 1.78 cm2
S' Lateral: 3.2 cm

## 2024-07-02 NOTE — Assessment & Plan Note (Signed)
 Treated with rosuvastatin .  Lipids in January 2025 showed cholesterol 168, HDL 53, LDL 92.  Cardiac catheterization in 2022 showed no significant CAD with only mild diffuse plaquing.

## 2024-07-02 NOTE — Patient Instructions (Signed)
 Testing/Procedures: ECHO (in 1 year, prior to visit) Your physician has requested that you have an echocardiogram. Echocardiography is a painless test that uses sound waves to create images of your heart. It provides your doctor with information about the size and shape of your heart and how well your heart's chambers and valves are working. This procedure takes approximately one hour. There are no restrictions for this procedure. Please do NOT wear cologne, perfume, aftershave, or lotions (deodorant is allowed). Please arrive 15 minutes prior to your appointment time.  Please note: We ask at that you not bring children with you during ultrasound (echo/ vascular) testing. Due to room size and safety concerns, children are not allowed in the ultrasound rooms during exams. Our front office staff cannot provide observation of children in our lobby area while testing is being conducted. An adult accompanying a patient to their appointment will only be allowed in the ultrasound room at the discretion of the ultrasound technician under special circumstances. We apologize for any inconvenience.  Follow-Up: At [facility], you and your health needs are our priority.  As part of our continuing mission to provide you with exceptional heart care, our providers are all part of one team.  This team includes your primary Cardiologist (physician) and Advanced Practice Providers or APPs (Physician Assistants and Nurse Practitioners) who all work together to provide you with the care you need, when you need it.  Your next appointment:   1 year(s)  Provider:   Arnoldo Lapping, MD

## 2024-07-02 NOTE — Assessment & Plan Note (Signed)
 Blood pressure above goal today.  He states that his blood pressure usually runs in the 120s over 70s.  He sees Dr. Keren next week and will continue to follow with him.  Reviewed potential options with him if his blood pressure remains above goal as he could either increase amlodipine to 10 mg daily or add an ARB if needed.  Will defer to Dr. Keren at follow-up.

## 2024-07-02 NOTE — Assessment & Plan Note (Signed)
 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  LV function and LV dimensions remain normal.  We have discussed complex surgical repair in the past and this would require sternotomy.  Patient has declined and favors conservative approach with surveillance.  I think this is appropriate considering his skeletal issues.  I think he would probably have a difficult time with recovery from a sternotomy.  He will return in 1 year with an echocardiogram.  He understands to seek immediate medical attention if he becomes symptomatic with shortness of breath, progressive fatigue, or other symptoms of heart failure.

## 2024-07-02 NOTE — Progress Notes (Signed)
 Cardiology Office Note:    Date:  07/02/2024   ID:  Cristian, Nunez 07/03/1946, MRN [id number]  PCP:  Cristian Vicenta BRAVO, MD   [facility] Providers Cardiologist:  Cristian Fell, MD     Referring MD: Cristian Vicenta BRAVO, MD   Chief Complaint  Patient presents with   Mitral Regurgitation    History of Present Illness:    Cristian Nunez is a 78 y.o. male presenting for follow-up of mitral regurgitation.  The patient has been followed medically in the context of his comorbid medical conditions.  He has a history of gastrointestinal bleeding and prostate cancer.  Last year he developed COVID-pneumonia.  The patient had a surveillance echocardiogram this morning prior to his visit.  This shows LVEF of 60 to 65%, grade 2 diastolic dysfunction, normal RV function, bileaflet holosystolic mitral valve prolapse with severe mitral regurgitation, and no significant aortic stenosis.  Ventricular dimensions remain normal with a end-diastolic diameter 4.6 cm and end-systolic diameter of 3.2 cm.  The patient is here with his wife today.  He has been doing well from a cardiac perspective.  He denies chest pain, chest pressure, or shortness of breath.  He has not been out walking lately because of the hot temperatures.  Prior to the past month, he was able to walk a few miles without exertional symptoms.  He gets some fatigue in his legs but has no other specific complaints.  He wears a back brace to treat his kyphoscoliosis with walking and states that initially he has pain in his back but it improves as he continues to walk.  He denies lightheadedness, palpitations, edema, orthopnea, or PND.  Current Medications: Current Meds  Medication Sig   acetaminophen  (TYLENOL ) 500 MG tablet Take 500 mg by mouth every 6 (six) hours as needed for moderate pain.   amLODipine (NORVASC) 5 MG tablet Take 5 mg by mouth daily.   b complex vitamins capsule Take 1 capsule by mouth 3 (three) times a week.  (Patient taking differently: Take 1 capsule by mouth daily.)   clobetasol ointment (TEMOVATE) 0.05 % Apply 1 application  topically 2 (two) times daily as needed (irritation).   ketoconazole (NIZORAL) 2 % shampoo Apply 1 application  topically as needed.   levothyroxine  (SYNTHROID ) 50 MCG tablet Take 50 mcg by mouth every other day.   levothyroxine  (SYNTHROID ) 75 MCG tablet Take 75 mcg by mouth every other day.   methocarbamol  (ROBAXIN ) 500 MG tablet Take 500 mg by mouth every 12 (twelve) hours as needed for muscle pain. for pain   omeprazole (PRILOSEC) 20 MG capsule Take 20 mg by mouth daily.   Polyethylene Glycol 3350  (MIRALAX PO) Take by mouth at bedtime as needed.   Psyllium (METAMUCIL 4 IN 1 FIBER) 55.6 % POWD Take by mouth as needed.   rosuvastatin  (CRESTOR ) 10 MG tablet Take 10 mg by mouth daily.   triamcinolone  cream (KENALOG) 0.1 % Apply 1 application  topically as needed (itching).     Allergies:   Patient has no known allergies.   ROS:   Please see the history of present illness.    All other systems reviewed and are negative.  EKGs/Labs/Other Studies Reviewed:    The following studies were reviewed today: Cardiac Studies & Procedures   ______________________________________________________________________________________________ CARDIAC CATHETERIZATION  CARDIAC CATHETERIZATION 02/03/2021  Conclusion 1.  Patent coronary arteries with mild diffuse atherosclerotic disease noted.  There is diffuse calcification of the RCA in particular.  There is mild  diffuse calcification of the LAD.  There is no more than 30 to 40% stenosis noted in any of the coronary vessels. 2.  Known severe mitral regurgitation with normal right heart hemodynamics 3.  Calcified mitral annulus is noted on plain fluoroscopy.  Findings Coronary Findings Diagnostic  Dominance: Right  Left Main The left main is patent with no significant stenosis.  The vessel divides into the LAD and left  circumflex.  Left Anterior Descending There is mild diffuse disease throughout the vessel. The LAD has mild diffuse disease with no evidence of high-grade stenosis.  The first diagonal is a large vessel and it is patent throughout.  Left Circumflex There is mild diffuse disease throughout the vessel. The circumflex is patent.  It supplies a high obtuse marginal branch and 2 posterolateral branches.  The vessel has diffuse plaquing with no high-grade stenoses.  Right Coronary Artery Vessel is large. There is mild diffuse disease throughout the vessel. The vessel is moderately calcified. The right coronary artery is a large, dominant vessel.  The proximal and mid vessel are heavily calcified.  There is 30 to 40% stenosis in the mid RCA.  The vessel is patent throughout.  There is no high-grade stenosis throughout the course of the vessel.  The PDA and PLA branches are patent without significant stenosis. Mid RCA lesion is 40% stenosed. Dist RCA lesion is 35% stenosed.  Intervention  No interventions have been documented.   STRESS TESTS  ECHOCARDIOGRAM STRESS TEST 08/17/2020  Narrative EXERCISE STRESS ECHO REPORT   --------------------------------------------------------------------------------  Patient Name:   Cristian Nunez Date of Exam: 08/17/2020 Medical Rec #:  [id number]      Height:       68.0 in Accession #:    [id number]     Weight:       152.0 lb Date of Birth:  1946/07/23       BSA:          1.819 m Patient Age:    74 years       BP:           168/94 mmHg Patient Gender: M              HR:           80 bpm. Exam Location:  [address]  Procedure: Stress Echo, Cardiac Doppler and Color Doppler  Indications:    I34.0  History:        Patient has prior history of Echocardiogram examinations, most recent 06/23/2020.  Sonographer:    Cristian Nunez RDCS Referring Phys: [phone] Cristian Nunez   Sonographer Comments: [facility] 12/09/18-OK to talk to Cristian Nunez and Cristian Nunez to leave voicemail on home phone IMPRESSIONS   1. Excellent LV contractile reserve. 2. Severe mitral regurgitation at rest and stress (worsened severity during stress) with increase in estimated pulmonary pressures. 3. 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 4. This is a negative stress echocardiogram for ischemia. 5. At baseline, severe mitral regurgitation was present. During peak stress, severe mitral regurgitation was present. 6. The baseline PASP was estimated to be 32.1 mmHg. The PASP increased to 45.0 mmHg at peak stress. 7. This is an intermediate risk study.  FINDINGS  Exam Protocol: The patient exercised on a treadmill according to a Bruce protocol.   Patient Performance: The patient exercised for 6 minutes and 30 seconds, achieving 8 METS. The maximum stage achieved was II of the Bruce protocol. The baseline heart rate was 86 bpm.  The heart rate at peak stress was 148 bpm. The target heart rate was calculated to be 122 bpm. The percentage of maximum predicted heart rate achieved was 102.8 %. The baseline blood pressure was 168/94 mmHg. The blood pressure at peak stress was 182/89 mmHg. The blood pressure response was normal. The patient developed MR during the stress exam.  EKG: Resting EKG showed normal sinus rhythm with rare premature ventricular contractions. The patient developed occasional PAC and No ST segment changes during exercise.   2D Echo Findings: Baseline regional wall motion abnormalities were not present. There were no stress-induced wall motion abnormalities. This is a negative stress echocardiogram for ischemia.  Stress Doppler:  Mitral Regurgitation: At baseline, severe mitral regurgitation was present. During peak stress, severe mitral regurgitation was present.  Right Ventricular Systolic Pressure: The baseline PASP was estimated to be 32.1 mmHg. The PASP increased to 45.0 mmHg at peak stress.  Additional Findings: Severe mitral  regurgitation at rest and stress (worsened severity during stress) with increase in estimated pulmonary pressures. Excellent LV contractile reserve. 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   Oneil Parchment MD Electronically signed on 08/17/2020 at 5:51:48 PM      Final   ECHOCARDIOGRAM  ECHOCARDIOGRAM COMPLETE 07/02/2024  Narrative ECHOCARDIOGRAM REPORT    Patient Name:   Cristian Nunez Date of Exam: 07/02/2024 Medical Rec #:  [id number]      Height:       68.0 in Accession #:    [id number]     Weight:       167.2 lb Date of Birth:  09-21-1946       BSA:          1.894 m Patient Age:    77 years       BP:           150/90 mmHg Patient Gender: M              HR:           73 bpm. Exam Location:  [address]  Procedure: 2D Echo, Cardiac Doppler and Color Doppler (Both Spectral and Color Flow Doppler were utilized during procedure).  Indications:    I35.0 Aortic Stenosis  History:        Patient has prior history of Echocardiogram examinations, most recent 06/10/2023. Risk Factors:Dyslipidemia and Hypertension. Lower extremity edema.  Sonographer:    Carl Rodgers-Jones RDCS Referring Phys: [id number] Daemion Mcniel  IMPRESSIONS   1. Left ventricular ejection fraction, by estimation, is 60 to 65%. The left ventricle has normal function. The left ventricle has no regional wall motion abnormalities. There is mild left ventricular hypertrophy. Left ventricular diastolic parameters are consistent with Grade II diastolic dysfunction (pseudonormalization). 2. Right ventricular systolic function is normal. The right ventricular size is normal. 3. Left atrial size was severely dilated. 4. The mitral valve is normal in structure. Severe mitral valve regurgitation. Mild mitral stenosis. There is mild holosystolic prolapse of both leaflets of the mitral valve. The mean mitral valve gradient is 4.0 mmHg. 5. The aortic valve is tricuspid. There is mild calcification of the aortic  valve. There is mild thickening of the aortic valve. Aortic valve regurgitation is not visualized. No aortic stenosis is present. 6. Aortic dilatation noted. There is borderline dilatation of the ascending aorta, measuring 39 mm. 7. The inferior vena cava is normal in size with greater than 50% respiratory variability, suggesting right atrial pressure of 3 mmHg.  FINDINGS Left Ventricle: Left ventricular ejection  fraction, by estimation, is 60 to 65%. The left ventricle has normal function. The left ventricle has no regional wall motion abnormalities. The left ventricular internal cavity size was normal in size. There is mild left ventricular hypertrophy. Left ventricular diastolic parameters are consistent with Grade II diastolic dysfunction (pseudonormalization).  Right Ventricle: The right ventricular size is normal. No increase in right ventricular wall thickness. Right ventricular systolic function is normal.  Left Atrium: Left atrial size was severely dilated.  Right Atrium: Right atrial size was normal in size.  Pericardium: There is no evidence of pericardial effusion.  The mitral valve is normal in structure. There is mild holosystolic prolapse of both leaflets of the mitral valve. There is mild thickening of the mitral valve leaflet(s). Severe mitral valve regurgitation. Mild mitral valve stenosis. Tricuspid Valve: The tricuspid valve is normal in structure. Tricuspid valve regurgitation is mild . No evidence of tricuspid stenosis.  The aortic valve is tricuspid. There is mild calcification of the aortic valve. There is mild thickening of the aortic valve. Aortic valve regurgitation is not visualized. No aortic stenosis is present. Pulmonic Valve: The pulmonic valve was normal in structure. Pulmonic valve regurgitation is not visualized. No evidence of pulmonic stenosis.  Aorta: Aortic dilatation noted. There is borderline dilatation of the ascending aorta, measuring 39 mm.  Venous:  The inferior vena cava is normal in size with greater than 50% respiratory variability, suggesting right atrial pressure of 3 mmHg.  IAS/Shunts: No atrial level shunt detected by color flow Doppler.   LEFT VENTRICLE PLAX 2D LVIDd:         4.60 cm   Diastology LVIDs:         3.20 cm   LV e' medial:    4.95 cm/s LV PW:         1.60 cm   LV E/e' medial:  17.4 LV IVS:        1.20 cm   LV e' lateral:   5.16 cm/s LVOT diam:     2.10 cm   LV E/e' lateral: 16.6 LV SV:         72 LV SV Index:   38 LVOT Area:     3.46 cm   RIGHT VENTRICLE             IVC RV Basal diam:  3.70 cm     IVC diam: 1.60 cm RV S prime:     15.83 cm/s TAPSE (M-mode): 2.9 cm  LEFT ATRIUM              Index        RIGHT ATRIUM           Index LA diam:        5.50 cm  2.90 cm/m   RA Area:     16.10 cm LA Vol (A2C):   97.6 ml  51.53 ml/m  RA Volume:   42.90 ml  22.65 ml/m LA Vol (A4C):   105.0 ml 55.44 ml/m LA Biplane Vol: 101.0 ml 53.33 ml/m AORTIC VALVE AV Area (Vmax):    2.87 cm AV Area (Vmean):   2.74 cm AV Area (VTI):     2.85 cm AV Vmax:           116.50 cm/s AV Vmean:          80.900 cm/s AV VTI:            0.254 m AV Peak Grad:      5.4 mmHg AV  Mean Grad:      3.0 mmHg LVOT Vmax:         96.50 cm/s LVOT Vmean:        64.100 cm/s LVOT VTI:          0.209 m LVOT/AV VTI ratio: 0.82  AORTA Ao Root diam: 4.10 cm Ao Asc diam:  3.90 cm  MITRAL VALVE               TRICUSPID VALVE MV Area (PHT): 3.48 cm    TR Peak grad:   28.3 mmHg MV Area VTI:   1.78 cm    TR Vmax:        266.00 cm/s MV Peak grad:  8.0 mmHg MV Mean grad:  4.0 mmHg    SHUNTS MV Vmax:       1.41 m/s    Systemic VTI:  0.21 m MV Vmean:      93.3 cm/s   Systemic Diam: 2.10 cm MV Decel Time: 218 msec MR Peak grad: 165.0 mmHg MR Mean grad: 108.3 mmHg MR Vmax:      642.25 cm/s MR Vmean:     492.7 cm/s MV E velocity: 85.95 cm/s MV A velocity: 86.70 cm/s MV E/A ratio:  0.99  Oneil Parchment MD Electronically signed by Oneil Parchment  MD Signature Date/Time: 07/02/2024/10:34:24 AM    Final   TEE  ECHO TEE 04/12/2021  Narrative TRANSESOPHOGEAL ECHO REPORT    Patient Name:   Cristian Nunez Date of Exam: 04/12/2021 Medical Rec #:  [id number]      Height:       67.0 in Accession #:    [id number]     Weight:       158.0 lb Date of Birth:  02-16-1946       BSA:          1.829 m Patient Age:    74 years       BP:           183/83 mmHg Patient Gender: M              HR:           68 bpm. Exam Location:  Inpatient  Procedure: 3D Echo, Transesophageal Echo, Cardiac Doppler and Color Doppler  Indications:     I34.1 Nonrheumatic mitral (valve) prolapse; I34.0 Nonrheumatic mitral (valve) insufficiency  History:         Patient has prior history of Echocardiogram examinations, most recent 06/23/2020. Mitral Valve Prolapse and Mitral Valve Disease; Risk Factors:Diabetes and Dyslipidemia. Severe mitral regurgitation.  Sonographer:     Ellouise Mose RDCS Referring Phys:  [id number] [facility] A SANTO Diagnosing Phys: Stanly SANTO MD  PROCEDURE: After discussion of the risks and benefits of a TEE, an informed consent was obtained from the patient. The transesophogeal probe was passed without difficulty through the esophogus of the patient. Imaged were obtained with the patient in a left lateral decubitus position. Sedation performed by different physician. The patient was monitored while under deep sedation. Anesthestetic sedation was provided intravenously by Anesthesiology: 500mg  of Propofol . The patient developed no complications during the procedure.  IMPRESSIONS   1. Mixed Picture Mitral Regurgitation: Bilealet prolapse with a functial component with sphericity index 0.91 and planar angle 161. Coaptation gap 4.5 mm at worst. PML lenth > 9 mm. Pulmonary vein flow reversal noted. ERO 0.44 cm2. The mitral valve is abnormal. Severe mitral valve regurgitation. The mean mitral valve gradient is 1.0 mmHg. 2. Left  ventricular ejection  fraction, by estimation, is 55 to 60%. The left ventricle has normal function. 3. Evidence of atrial level shunting detected by color flow Doppler. There is a small patent foramen ovale. Chiari network noted and would be seen during an echo-guided transeptal puncture. 4. Right ventricular systolic function is normal. The right ventricular size is normal. 5. Left atrial size was severely dilated. No left atrial/left atrial appendage thrombus was detected. 6. The aortic valve is normal in structure. Aortic valve regurgitation is not visualized. 7. Aortic dilatation noted. There is mild dilatation of the aortic root, measuring 40 mm. There is Moderate (Grade III) plaque involving the descending aorta.  FINDINGS Left Ventricle: Left ventricular ejection fraction, by estimation, is 55 to 60%. The left ventricle has normal function. The left ventricular internal cavity size was normal in size.  Right Ventricle: The right ventricular size is normal. No increase in right ventricular wall thickness. Right ventricular systolic function is normal.  Left Atrium: Left atrial size was severely dilated. No left atrial/left atrial appendage thrombus was detected.  Right Atrium: Right atrial size was normal in size. Prominent Chiari network.  Pericardium: There is no evidence of pericardial effusion.  Mitral Valve: Mixed Picture Mitral Regurgitation: Bilealet prolapse with a functial component with sphericity index 0.91 and planar angle 161. Coaptation gap 4.5 mm at worst. PML lenth > 9 mm. Pulmonary vein flow reversal noted. ERO 0.44 cm2. The mitral valve is abnormal. There is severe thickening of the mitral valve leaflet(s). Severe mitral valve regurgitation. MV peak gradient, 2.0 mmHg. The mean mitral valve gradient is 1.0 mmHg.  Tricuspid Valve: The tricuspid valve is normal in structure. Tricuspid valve regurgitation is trivial.  Aortic Valve: The aortic valve is normal in structure.  Aortic valve regurgitation is not visualized.  Pulmonic Valve: The pulmonic valve was grossly normal. Pulmonic valve regurgitation is not visualized.  Aorta: Aortic dilatation noted. There is mild dilatation of the aortic root, measuring 40 mm. There is moderate (Grade III) plaque involving the descending aorta.  IAS/Shunts: Evidence of atrial level shunting detected by color flow Doppler. A small patent foramen ovale is detected.   LEFT VENTRICLE PLAX 2D LVOT diam:     2.30 cm LVOT Area:     4.15 cm   MITRAL VALVE MV Peak grad: 2.0 mmHg       SHUNTS MV Mean grad: 1.0 mmHg       Systemic Diam: 2.30 cm MV Vmax:      0.70 m/s MV Vmean:     49.1 cm/s MR Peak grad:    121.0 mmHg MR Mean grad:    74.0 mmHg MR Vmax:         550.00 cm/s MR Vmean:        398.0 cm/s MR PISA:         6.28 cm MR PISA Eff ROA: 44 mm MR PISA Radius:  1.00 cm  Stanly Leavens MD Electronically signed by Stanly Leavens MD Signature Date/Time: 04/13/2021/8:38:03 AM    Final        ______________________________________________________________________________________________      EKG:        Recent Labs: No results found for requested labs within last 365 days.  Recent Lipid Panel    Component Value Date/Time   CHOL 145 05/20/2019 1017   TRIG 96 05/20/2019 1017   HDL 46 05/20/2019 1017   CHOLHDL 3.2 05/20/2019 1017   LDLCALC 80 05/20/2019 1017           Physical Exam:  VS:  BP (!) 157/80 (BP Location: Left Arm)   Pulse 68   Ht 5' 8 (1.727 m)   Wt 170 lb (77.1 kg)   SpO2 94%   BMI 25.85 kg/m     Wt Readings from Last 3 Encounters:  07/02/24 170 lb (77.1 kg)  12/26/23 167 lb 3.2 oz (75.8 kg)  05/13/23 161 lb 3.2 oz (73.1 kg)     GEN: Pleasant male with severe kyphoscoliosis, in no acute distress HEENT: Normal NECK: No JVD; No carotid bruits LYMPHATICS: No lymphadenopathy CARDIAC: RRR, 3/6 holosystolic murmur at the apex RESPIRATORY:  Clear to auscultation  without rales, wheezing or rhonchi  ABDOMEN: Soft, non-tender, non-distended MUSCULOSKELETAL:  No edema; No deformity  SKIN: Warm and dry NEUROLOGIC:  Alert and oriented x 3 PSYCHIATRIC:  Normal affect   Assessment & Plan Nonrheumatic mitral valve regurgitation The patient has severe, stage C mitral regurgitation due to bileaflet prolapse.  LV function and LV dimensions remain normal.  We have discussed complex surgical repair in the past and this would require sternotomy.  Patient has declined and favors conservative approach with surveillance.  I think this is appropriate considering his skeletal issues.  I think he would probably have a difficult time with recovery from a sternotomy.  He will return in 1 year with an echocardiogram.  He understands to seek immediate medical attention if he becomes symptomatic with shortness of breath, progressive fatigue, or other symptoms of heart failure. Mixed hyperlipidemia Treated with rosuvastatin .  Lipids in January 2025 showed cholesterol 168, HDL 53, LDL 92.  Cardiac catheterization in 2022 showed no significant CAD with only mild diffuse plaquing. Essential hypertension Blood pressure above goal today.  He states that his blood pressure usually runs in the 120s over 70s.  He sees Dr. Keren next week and will continue to follow with him.  Reviewed potential options with him if his blood pressure remains above goal as he could either increase amlodipine to 10 mg daily or add an ARB if needed.  Will defer to Dr. Keren at follow-up.      Medication Adjustments/Labs and Tests Ordered: Current medicines are reviewed at length with the patient today.  Concerns regarding medicines are outlined above.  Orders Placed This Encounter  Procedures   ECHOCARDIOGRAM COMPLETE   No orders of the defined types were placed in this encounter.   Patient Instructions  Testing/Procedures: ECHO (in 1 year, prior to visit) Your physician has requested that you  have an echocardiogram. Echocardiography is a painless test that uses sound waves to create images of your heart. It provides your doctor with information about the size and shape of your heart and how well your heart's chambers and valves are working. This procedure takes approximately one hour. There are no restrictions for this procedure. Please do NOT wear cologne, perfume, aftershave, or lotions (deodorant is allowed). Please arrive 15 minutes prior to your appointment time.  Please note: We ask at that you not bring children with you during ultrasound (echo/ vascular) testing. Due to room size and safety concerns, children are not allowed in the ultrasound rooms during exams. Our front office staff cannot provide observation of children in our lobby area while testing is being conducted. An adult accompanying a patient to their appointment will only be allowed in the ultrasound room at the discretion of the ultrasound technician under special circumstances. We apologize for any inconvenience.  Follow-Up: At [facility], you and your health needs are our priority.  As part of our continuing mission to provide you with exceptional heart care, our providers are all part of one team.  This team includes your primary Cardiologist (physician) and Advanced Practice Providers or APPs (Physician Assistants and Nurse Practitioners) who all work together to provide you with the care you need, when you need it.  Your next appointment:   1 year(s)  Provider:   Ozell Fell, MD       Signed, Cristian Fell, MD  07/02/2024 11:07 AM    [facility]

## 2024-07-08 DIAGNOSIS — E785 Hyperlipidemia, unspecified: Secondary | ICD-10-CM | POA: Diagnosis not present

## 2024-07-08 DIAGNOSIS — I10 Essential (primary) hypertension: Secondary | ICD-10-CM | POA: Diagnosis not present

## 2024-07-08 DIAGNOSIS — E039 Hypothyroidism, unspecified: Secondary | ICD-10-CM | POA: Diagnosis not present

## 2024-07-08 DIAGNOSIS — C61 Malignant neoplasm of prostate: Secondary | ICD-10-CM | POA: Diagnosis not present

## 2024-07-08 DIAGNOSIS — D62 Acute posthemorrhagic anemia: Secondary | ICD-10-CM | POA: Diagnosis not present

## 2024-07-08 DIAGNOSIS — K219 Gastro-esophageal reflux disease without esophagitis: Secondary | ICD-10-CM | POA: Diagnosis not present

## 2024-07-08 DIAGNOSIS — R7301 Impaired fasting glucose: Secondary | ICD-10-CM | POA: Diagnosis not present

## 2024-07-08 DIAGNOSIS — I340 Nonrheumatic mitral (valve) insufficiency: Secondary | ICD-10-CM | POA: Diagnosis not present

## 2024-08-05 DIAGNOSIS — I10 Essential (primary) hypertension: Secondary | ICD-10-CM | POA: Diagnosis not present

## 2024-08-12 ENCOUNTER — Telehealth: Payer: Self-pay | Admitting: *Deleted

## 2024-08-12 NOTE — Telephone Encounter (Signed)
 Norleen LELON Room daughter Sharlet in to see this nurse.  [employer] has not received the insurance paperwork.  Form was faxed 06/08/2024 to  fax number on forms [phone].  Attempted to resend download of form e-mail to fax  Could not be delivered.  Copy provided to Sharlet to return to Water [employer].  No further questions or needs,.   1714: Email noted with second attempt.   To:               Recipient at [phone] Subject:          Life Insurance Result:           The transmission was successful. Explanation:      All Pages Ok Pages Sent:       21 Connect Time:     12 minutes, 33 seconds

## 2024-09-08 ENCOUNTER — Telehealth: Payer: Self-pay

## 2024-09-08 NOTE — Telephone Encounter (Signed)
 Notified the pt daughter about a policy pertaining to her father. He was needing Itemizing billing for. Pt daughter states that she will obtain those for him.

## 2024-11-23 DIAGNOSIS — C44319 Basal cell carcinoma of skin of other parts of face: Secondary | ICD-10-CM | POA: Diagnosis not present

## 2024-11-23 DIAGNOSIS — L570 Actinic keratosis: Secondary | ICD-10-CM | POA: Diagnosis not present

## 2024-11-23 DIAGNOSIS — L400 Psoriasis vulgaris: Secondary | ICD-10-CM | POA: Diagnosis not present

## 2024-11-23 DIAGNOSIS — D485 Neoplasm of uncertain behavior of skin: Secondary | ICD-10-CM | POA: Diagnosis not present

## 2024-11-23 DIAGNOSIS — L578 Other skin changes due to chronic exposure to nonionizing radiation: Secondary | ICD-10-CM | POA: Diagnosis not present

## 2024-11-23 DIAGNOSIS — D225 Melanocytic nevi of trunk: Secondary | ICD-10-CM | POA: Diagnosis not present
